# Patient Record
Sex: Male | Born: 1955
Health system: Southern US, Community
[De-identification: ages and names within clinical notes are randomized; demographics above are authoritative.]

## PROBLEM LIST (undated history)

## (undated) DIAGNOSIS — T7840XA Allergy, unspecified, initial encounter: Secondary | ICD-10-CM

## (undated) DIAGNOSIS — K5792 Diverticulitis of intestine, part unspecified, without perforation or abscess without bleeding: Secondary | ICD-10-CM

## (undated) DIAGNOSIS — K219 Gastro-esophageal reflux disease without esophagitis: Secondary | ICD-10-CM

## (undated) DIAGNOSIS — K6289 Other specified diseases of anus and rectum: Secondary | ICD-10-CM

## (undated) DIAGNOSIS — K611 Rectal abscess: Secondary | ICD-10-CM

## (undated) DIAGNOSIS — Z8619 Personal history of other infectious and parasitic diseases: Secondary | ICD-10-CM

## (undated) DIAGNOSIS — M1990 Unspecified osteoarthritis, unspecified site: Secondary | ICD-10-CM

## (undated) DIAGNOSIS — I10 Essential (primary) hypertension: Secondary | ICD-10-CM

## (undated) HISTORY — DX: Gastro-esophageal reflux disease without esophagitis: K21.9

## (undated) HISTORY — PX: HEMORRHOID BANDING: SHX5850

## (undated) HISTORY — DX: Other specified diseases of anus and rectum: K62.89

## (undated) HISTORY — DX: Rectal abscess: K61.1

## (undated) HISTORY — PX: FRACTURE SURGERY: SHX138

## (undated) HISTORY — DX: Allergy, unspecified, initial encounter: T78.40XA

## (undated) HISTORY — DX: Personal history of other infectious and parasitic diseases: Z86.19

## (undated) HISTORY — PX: OTHER SURGICAL HISTORY: SHX169

## (undated) HISTORY — DX: Unspecified osteoarthritis, unspecified site: M19.90

## (undated) NOTE — *Deleted (*Deleted)
Health Maintenance Due  Topic Date Due  . INFLUENZA VACCINE  12/01/2019  We will call you within two weeks about your referral to Gastroenterology. If you do not hear within 3 weeks, give [place] a call.   Depression screen [facility] 2/9 08/15/2019 10/15/2018 04/16/2018  Decreased Interest 0 1 0  Down, Depressed, Hopeless 0 1 0  PHQ - 2 Score 0 2 0  Altered sleeping 0 2 -  Tired, decreased energy 0 2 -  Change in appetite 0 1 -  Feeling bad or failure about yourself  0 0 -  Trouble concentrating 0 1 -  Moving slowly or fidgety/restless 0 0 -  Suicidal thoughts 0 0 -  PHQ-9 Score 0 8 -  Difficult doing work/chores Not difficult at all - -

---

## 1999-10-02 ENCOUNTER — Other Ambulatory Visit: Admission: RE | Admit: 1999-10-02 | Discharge: 1999-10-02 | Payer: Self-pay | Admitting: *Deleted

## 1999-10-22 ENCOUNTER — Ambulatory Visit (HOSPITAL_BASED_OUTPATIENT_CLINIC_OR_DEPARTMENT_OTHER): Admission: RE | Admit: 1999-10-22 | Discharge: 1999-10-22 | Payer: Self-pay | Admitting: *Deleted

## 1999-10-22 ENCOUNTER — Encounter (INDEPENDENT_AMBULATORY_CARE_PROVIDER_SITE_OTHER): Payer: Self-pay | Admitting: *Deleted

## 2000-10-06 ENCOUNTER — Ambulatory Visit (HOSPITAL_COMMUNITY): Admission: RE | Admit: 2000-10-06 | Discharge: 2000-10-06 | Payer: Self-pay | Admitting: Gastroenterology

## 2010-01-08 ENCOUNTER — Encounter: Admission: RE | Admit: 2010-01-08 | Discharge: 2010-01-08 | Payer: Self-pay | Admitting: Orthopedic Surgery

## 2010-01-12 ENCOUNTER — Ambulatory Visit (HOSPITAL_COMMUNITY): Admission: RE | Admit: 2010-01-12 | Discharge: 2010-01-13 | Payer: Self-pay | Admitting: Orthopedic Surgery

## 2010-01-12 HISTORY — PX: SHOULDER SURGERY: SHX246

## 2010-01-14 ENCOUNTER — Encounter: Admission: RE | Admit: 2010-01-14 | Discharge: 2010-01-14 | Payer: Self-pay | Admitting: Orthopedic Surgery

## 2010-02-22 ENCOUNTER — Emergency Department (HOSPITAL_COMMUNITY): Admission: EM | Admit: 2010-02-22 | Discharge: 2010-02-22 | Payer: Self-pay | Admitting: Emergency Medicine

## 2010-02-26 ENCOUNTER — Encounter
Admission: RE | Admit: 2010-02-26 | Discharge: 2010-04-30 | Payer: Self-pay | Source: Home / Self Care | Attending: Orthopedic Surgery | Admitting: Orthopedic Surgery

## 2010-03-08 ENCOUNTER — Ambulatory Visit (HOSPITAL_COMMUNITY): Admission: RE | Admit: 2010-03-08 | Discharge: 2010-03-08 | Payer: Self-pay | Admitting: General Surgery

## 2010-05-03 ENCOUNTER — Encounter
Admission: RE | Admit: 2010-05-03 | Discharge: 2010-06-01 | Payer: Self-pay | Source: Home / Self Care | Attending: Orthopedic Surgery | Admitting: Orthopedic Surgery

## 2010-05-31 ENCOUNTER — Encounter: Admit: 2010-05-31 | Payer: Self-pay | Admitting: Orthopedic Surgery

## 2010-06-07 ENCOUNTER — Encounter: Admit: 2010-06-07 | Payer: Self-pay | Admitting: Orthopedic Surgery

## 2010-06-07 ENCOUNTER — Ambulatory Visit: Payer: Managed Care, Other (non HMO) | Attending: Orthopedic Surgery | Admitting: Physical Therapy

## 2010-06-07 DIAGNOSIS — M25619 Stiffness of unspecified shoulder, not elsewhere classified: Secondary | ICD-10-CM | POA: Insufficient documentation

## 2010-06-07 DIAGNOSIS — M25519 Pain in unspecified shoulder: Secondary | ICD-10-CM | POA: Insufficient documentation

## 2010-06-07 DIAGNOSIS — IMO0001 Reserved for inherently not codable concepts without codable children: Secondary | ICD-10-CM | POA: Insufficient documentation

## 2010-06-14 ENCOUNTER — Ambulatory Visit: Payer: Managed Care, Other (non HMO) | Admitting: Physical Therapy

## 2010-06-21 ENCOUNTER — Ambulatory Visit: Payer: Managed Care, Other (non HMO) | Admitting: Physical Therapy

## 2010-06-28 ENCOUNTER — Ambulatory Visit: Payer: Managed Care, Other (non HMO) | Admitting: Physical Therapy

## 2010-07-14 LAB — CBC
HCT: 46.1 % (ref 39.0–52.0)
Hemoglobin: 15.6 g/dL (ref 13.0–17.0)
MCH: 31.8 pg (ref 26.0–34.0)
MCHC: 33.8 g/dL (ref 30.0–36.0)
MCV: 94.1 fL (ref 78.0–100.0)
Platelets: 190 10*3/uL (ref 150–400)
RBC: 4.9 MIL/uL (ref 4.22–5.81)
RDW: 13.4 % (ref 11.5–15.5)
WBC: 8.5 10*3/uL (ref 4.0–10.5)

## 2010-07-14 LAB — DIFFERENTIAL
Basophils Absolute: 0 10*3/uL (ref 0.0–0.1)
Basophils Relative: 0 % (ref 0–1)
Eosinophils Absolute: 0 10*3/uL (ref 0.0–0.7)
Eosinophils Relative: 0 % (ref 0–5)
Lymphocytes Relative: 14 % (ref 12–46)
Lymphs Abs: 1.2 10*3/uL (ref 0.7–4.0)
Monocytes Absolute: 0.6 10*3/uL (ref 0.1–1.0)
Monocytes Relative: 7 % (ref 3–12)
Neutro Abs: 6.8 10*3/uL (ref 1.7–7.7)
Neutrophils Relative %: 79 % — ABNORMAL HIGH (ref 43–77)

## 2010-07-14 LAB — LIPASE, BLOOD: Lipase: 33 U/L (ref 11–59)

## 2010-07-14 LAB — COMPREHENSIVE METABOLIC PANEL
AST: 67 U/L — ABNORMAL HIGH (ref 0–37)
Albumin: 4.3 g/dL (ref 3.5–5.2)
BUN: 8 mg/dL (ref 6–23)
Calcium: 9.3 mg/dL (ref 8.4–10.5)
Creatinine, Ser: 0.99 mg/dL (ref 0.4–1.5)
GFR calc Af Amer: >60 mL/min (ref >60)
Total Bilirubin: 0.5 mg/dL (ref 0.3–1.2)
Total Protein: 7.5 g/dL (ref 6.0–8.3)

## 2010-07-15 LAB — COMPREHENSIVE METABOLIC PANEL
ALT: 65 U/L — ABNORMAL HIGH (ref 0–53)
AST: 56 U/L — ABNORMAL HIGH (ref 0–37)
BUN: 14 mg/dL (ref 6–23)
CO2: 29 mEq/L (ref 19–32)
Calcium: 9.5 mg/dL (ref 8.4–10.5)
Chloride: 103 mEq/L (ref 96–112)
Creatinine, Ser: 0.92 mg/dL (ref 0.4–1.5)
GFR calc Af Amer: >60 mL/min (ref >60)
GFR calc non Af Amer: >60 mL/min (ref >60)
Glucose, Bld: 122 mg/dL — ABNORMAL HIGH (ref 70–99)
Potassium: 4.4 mEq/L (ref 3.5–5.1)
Total Bilirubin: 1 mg/dL (ref 0.3–1.2)
Total Protein: 7 g/dL (ref 6.0–8.3)

## 2010-07-15 LAB — APTT: aPTT: 33 seconds (ref 24–37)

## 2010-07-15 LAB — URINALYSIS, ROUTINE W REFLEX MICROSCOPIC
Bilirubin Urine: NEGATIVE
Glucose, UA: NEGATIVE mg/dL
Hgb urine dipstick: NEGATIVE
Ketones, ur: NEGATIVE mg/dL
Nitrite: NEGATIVE
Protein, ur: NEGATIVE mg/dL
Specific Gravity, Urine: 1.025 (ref 1.005–1.030)

## 2010-07-15 LAB — PROTIME-INR: Prothrombin Time: 13.2 seconds (ref 11.6–15.2)

## 2010-07-15 LAB — CBC
HCT: 42.1 % (ref 39.0–52.0)
Hemoglobin: 14.4 g/dL (ref 13.0–17.0)
MCH: 32.8 pg (ref 26.0–34.0)
MCV: 95.9 fL (ref 78.0–100.0)
RBC: 4.39 MIL/uL (ref 4.22–5.81)
RDW: 13 % (ref 11.5–15.5)
WBC: 7.3 10*3/uL (ref 4.0–10.5)

## 2010-07-15 LAB — SURGICAL PCR SCREEN
MRSA, PCR: NEGATIVE
Staphylococcus aureus: POSITIVE — AB

## 2010-09-17 NOTE — Op Note (Signed)
[place]  Patient:    Marc Ponce, Marc Ponce                       MRN: [id number] Proc. Date: 10/22/99 Adm. Date:  [id number] Disc. Date: [id number] Attending:  Kathleen Lime                           Operative Report  PREOPERATIVE DIAGNOSIS: 1. Right parotid mass. 2. Deviated nasal septum. 3. Compensatory hyperplasia of inferior turbinates.  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  OPERATION PERFORMED: 1. Excision of right parotid mass without VII nerve dissection. 2. Nasal septoplasty. 3. Submucous resection of right inferior nasal turbinate.  SURGEON:  Windell Moment, M.D.  ANESTHESIA:  General orotracheal.  DESCRIPTION OF PROCEDURE:  With the patient under general orotracheal anesthesia, the left face and neck was prepped and draped in sterile fashion with full visualization of the left side of the face.  A modified Blair incision was marked, tattooed, and incision made through skin, subcutaneous tissues, fatty layer of tissue down to the capsule of the parotid gland.  The flap was elevated anteriorly and the parotid fascia.  The patient had a 1 to 1.5 cm firm freely mobile persistent nodular mass presenting overlying the angle of the mandible region or just anterior and on elevating the flap we encountered mass at the anterior aspect of the parotid gland.  Careful visual inspection revealed that it appeared to be lying on the lateral surface of the anterior aspect of the parotid gland capsule.  Therefore careful slow dissection with monitoring for facial movement of the mass surrounded by fatty tissue was slowly done, able to completely excise the nodular mass off of the anterolateral parotid gland substance with the nodular mass actually leaving an indentation in the gland at this point.  Frozen section of the mass which was sectioned in the operating room appeared to be a little definitive nodular mass  either fatty or lymph node appeared grossly.  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  With hemostasis complete with use of the Shaw scalpel throughout the procedure after elevating the flap and light touch cautery, the flap was sewn back with two layer closure using interrupted 4-0 chromic catgut sutures subcutaneously and the skin approximated with a running 4-0 Surgilon suture.  The patient was then reprepped and draped for nasal surgery.  Nasal block was applied with olive tipped cotton probe soaked with 4% Xylocaine with ephedrine solution applied to the sphenopalatine and anterior ethmoid nerve areas. Cotton pledgets soaked in a similar solution were inserted along the middle and inferior turbinates though unable to insert on the left side due to the marked septal deflection along the maxillary crest into the lateral nasal wall.  With the nose vasoconstricted and additionally infiltrated with 1% Xylocaine with 1:100,000 epinephrine in the columella and septum and the nose to inspection revealed immediately revealed anterior maxillary crest, inferior quadrilateral cartilage spur, sharp jutting into the lateral nasal wall at the piriform aperture.  There was compensatory hyperplasia of the right inferior turbinate, marked sharp angulated irregular deflection to the left visible on the right as well.  A caudal incision was made inside the left nasal vestibule along the free margin of the caudal septal cartilage.  Mucosal flaps were elevated off the cartilage and bony septum bilaterally encountering the very sharp maxillary crest and vomerine as  well as inferior quadrilateral cartilage deflection to the left.  The cartilaginous maxillary crest and vomerine deformities were all removed.  Once this was cleared away it was evident that the perpendicular ethmoid plate likewise was deflected towards the left quite significantly and the quadrilateral cartilage was separated  posterior superiorly and the deflected ethmoid plate removed as well.  This allowed the septum to lie pretty much in the midline, there was still some convexity of the anterior septal strut which needed to be maintained in position for adequate support.  The posterior inferior quadrilateral cartilage completely separated from the nasal spine area and reattached with a figure-of-eight 4-0 chromic catgut suture at time of closure.  Stab incision was made over the anterior aspect of the right inferior turbinate.  The septal mucosa was elevated off the turbinate bone, the lower third to 50% of the turbinate bone and attached free margin and meatal mucosa excised taking down the posterior extension completely.  Hemostasis obtained with suction cautery along the bony and mucosal margins.  The remaining inferior meatal bone was outfractured.  The caudal incision was then closed having made a pocket to receive the caudal septal cartilage in the columella suturing through the mucosal flap and cartilage and back to the columellar skin to stabilize the septal cartilage in its repositioned normal columellar location.  The nose was then packed with Vaseline gauze impregnated with Bacitracin bilaterally.  The patients oral cavity was inspected.  There was no bleeding in the nasopharynx.  This was suctioned completely clear.  Estimated blood loss for the total procedure was 50 cc or less.  The patient tolerated the procedure well and was taken to the recovery room in stable general condition. DD:  10/22/99 TD:  10/25/99 Job: [id number]

## 2011-04-16 ENCOUNTER — Inpatient Hospital Stay (HOSPITAL_COMMUNITY)
Admission: EM | Admit: 2011-04-16 | Discharge: 2011-04-20 | DRG: 392 | Disposition: A | Discharge status: Home / Self Care | Payer: Managed Care, Other (non HMO) | Source: Ambulatory Visit | Attending: Family Medicine | Admitting: Family Medicine

## 2011-04-16 ENCOUNTER — Encounter (HOSPITAL_COMMUNITY): Payer: Self-pay | Admitting: *Deleted

## 2011-04-16 ENCOUNTER — Emergency Department (HOSPITAL_COMMUNITY): Payer: Managed Care, Other (non HMO)

## 2011-04-16 ENCOUNTER — Emergency Department (HOSPITAL_BASED_OUTPATIENT_CLINIC_OR_DEPARTMENT_OTHER)
Admission: EM | Admit: 2011-04-16 | Discharge: 2011-04-16 | Disposition: A | Discharge status: Home / Self Care | Payer: Managed Care, Other (non HMO) | Source: Home / Self Care | Attending: Emergency Medicine | Admitting: Emergency Medicine

## 2011-04-16 ENCOUNTER — Emergency Department (INDEPENDENT_AMBULATORY_CARE_PROVIDER_SITE_OTHER): Payer: Managed Care, Other (non HMO)

## 2011-04-16 ENCOUNTER — Encounter: Payer: Self-pay | Admitting: *Deleted

## 2011-04-16 DIAGNOSIS — F1010 Alcohol abuse, uncomplicated: Secondary | ICD-10-CM

## 2011-04-16 DIAGNOSIS — K5732 Diverticulitis of large intestine without perforation or abscess without bleeding: Principal | ICD-10-CM | POA: Diagnosis present

## 2011-04-16 DIAGNOSIS — K7689 Other specified diseases of liver: Secondary | ICD-10-CM

## 2011-04-16 DIAGNOSIS — R0989 Other specified symptoms and signs involving the circulatory and respiratory systems: Secondary | ICD-10-CM | POA: Diagnosis present

## 2011-04-16 DIAGNOSIS — I10 Essential (primary) hypertension: Secondary | ICD-10-CM | POA: Insufficient documentation

## 2011-04-16 DIAGNOSIS — K5792 Diverticulitis of intestine, part unspecified, without perforation or abscess without bleeding: Secondary | ICD-10-CM

## 2011-04-16 DIAGNOSIS — R109 Unspecified abdominal pain: Secondary | ICD-10-CM

## 2011-04-16 DIAGNOSIS — R0609 Other forms of dyspnea: Secondary | ICD-10-CM | POA: Diagnosis present

## 2011-04-16 DIAGNOSIS — R1032 Left lower quadrant pain: Secondary | ICD-10-CM | POA: Insufficient documentation

## 2011-04-16 DIAGNOSIS — K5900 Constipation, unspecified: Secondary | ICD-10-CM | POA: Diagnosis present

## 2011-04-16 HISTORY — DX: Diverticulitis of intestine, part unspecified, without perforation or abscess without bleeding: K57.92

## 2011-04-16 HISTORY — DX: Essential (primary) hypertension: I10

## 2011-04-16 LAB — DIFFERENTIAL
Basophils Absolute: 0 K/uL (ref 0.0–0.1)
Basophils Relative: 0 % (ref 0–1)
Basophils Relative: 0 % (ref 0–1)
Eosinophils Absolute: 0.1 K/uL (ref 0.0–0.7)
Eosinophils Relative: 1 % (ref 0–5)
Lymphocytes Relative: 16 % (ref 12–46)
Lymphocytes Relative: 5 % — ABNORMAL LOW (ref 12–46)
Lymphs Abs: 2.2 K/uL (ref 0.7–4.0)
Monocytes Absolute: 1.8 10*3/uL — ABNORMAL HIGH (ref 0.1–1.0)
Monocytes Absolute: 1 10*3/uL (ref 0.1–1.0)
Monocytes Relative: 14 % — ABNORMAL HIGH (ref 3–12)
Monocytes Relative: 12 % (ref 3–12)
Neutro Abs: 9.4 10*3/uL — ABNORMAL HIGH (ref 1.7–7.7)
Neutro Abs: 7.2 10*3/uL (ref 1.7–7.7)
Neutrophils Relative %: 70 % (ref 43–77)

## 2011-04-16 LAB — COMPREHENSIVE METABOLIC PANEL WITH GFR
GFR calc non Af Amer: 83 mL/min — ABNORMAL LOW (ref >90)
Sodium: 137 meq/L (ref 135–145)

## 2011-04-16 LAB — URINALYSIS, ROUTINE W REFLEX MICROSCOPIC
Bilirubin Urine: NEGATIVE
Glucose, UA: NEGATIVE mg/dL
Glucose, UA: NEGATIVE mg/dL
Hgb urine dipstick: NEGATIVE
Ketones, ur: NEGATIVE mg/dL
Ketones, ur: 15 mg/dL — AB
Leukocytes, UA: NEGATIVE
Nitrite: NEGATIVE
Nitrite: NEGATIVE
Protein, ur: NEGATIVE mg/dL
Protein, ur: NEGATIVE mg/dL
Specific Gravity, Urine: 1.008 (ref 1.005–1.030)
Specific Gravity, Urine: 1.035 — ABNORMAL HIGH (ref 1.005–1.030)
Urobilinogen, UA: 1 mg/dL (ref 0.0–1.0)
Urobilinogen, UA: 4 mg/dL — ABNORMAL HIGH (ref 0.0–1.0)
pH: 6 (ref 5.0–8.0)
pH: 5.5 (ref 5.0–8.0)

## 2011-04-16 LAB — URINE MICROSCOPIC-ADD ON

## 2011-04-16 LAB — COMPREHENSIVE METABOLIC PANEL
ALT: 57 U/L — ABNORMAL HIGH (ref 0–53)
AST: 53 U/L — ABNORMAL HIGH (ref 0–37)
Albumin: 3.8 g/dL (ref 3.5–5.2)
Alkaline Phosphatase: 84 U/L (ref 39–117)
BUN: 12 mg/dL (ref 6–23)
BUN: 16 mg/dL (ref 6–23)
CO2: 23 mEq/L (ref 19–32)
CO2: 23 mEq/L (ref 19–32)
Calcium: 9.3 mg/dL (ref 8.4–10.5)
Chloride: 103 mEq/L (ref 96–112)
Chloride: 101 mEq/L (ref 96–112)
Creatinine, Ser: 1 mg/dL (ref 0.50–1.35)
Creatinine, Ser: 1.22 mg/dL (ref 0.50–1.35)
GFR calc Af Amer: >90 mL/min (ref >90)
GFR calc Af Amer: 76 mL/min — ABNORMAL LOW (ref >90)
GFR calc non Af Amer: 66 mL/min — ABNORMAL LOW (ref >90)
Glucose, Bld: 115 mg/dL — ABNORMAL HIGH (ref 70–99)
Potassium: 4.1 mEq/L (ref 3.5–5.1)
Total Bilirubin: 1.3 mg/dL — ABNORMAL HIGH (ref 0.3–1.2)
Total Bilirubin: 1.8 mg/dL — ABNORMAL HIGH (ref 0.3–1.2)
Total Protein: 7.6 g/dL (ref 6.0–8.3)

## 2011-04-16 LAB — CBC
HCT: 44.4 % (ref 39.0–52.0)
HCT: 43.8 % (ref 39.0–52.0)
Hemoglobin: 15.1 g/dL (ref 13.0–17.0)
Hemoglobin: 15.2 g/dL (ref 13.0–17.0)
MCH: 32.5 pg (ref 26.0–34.0)
MCHC: 34 g/dL (ref 30.0–36.0)
MCHC: 34.7 g/dL (ref 30.0–36.0)
MCV: 95.7 fL (ref 78.0–100.0)
Platelets: 163 10*3/uL (ref 150–400)
RBC: 4.64 MIL/uL (ref 4.22–5.81)
RDW: 13.2 % (ref 11.5–15.5)
WBC: 13.5 10*3/uL — ABNORMAL HIGH (ref 4.0–10.5)

## 2011-04-16 MED ORDER — MORPHINE SULFATE 4 MG/ML IJ SOLN
4.0000 mg | Freq: Once | INTRAMUSCULAR | Status: AC
  Administered 2011-04-16: 4 mg via INTRAVENOUS
  Filled 2011-04-16: qty 1

## 2011-04-16 MED ORDER — IOHEXOL 300 MG/ML  SOLN
100.0000 mL | Freq: Once | INTRAMUSCULAR | Status: AC | PRN
  Administered 2011-04-16: 100 mL via INTRAVENOUS

## 2011-04-16 MED ORDER — HYDROCODONE-ACETAMINOPHEN 5-325 MG PO TABS: ORAL_TABLET | ORAL | Status: DC

## 2011-04-16 MED ORDER — CIPROFLOXACIN IN D5W 400 MG/200ML IV SOLN
400.0000 mg | Freq: Once | INTRAVENOUS | Status: AC
  Administered 2011-04-16: 400 mg via INTRAVENOUS
  Filled 2011-04-16: qty 200

## 2011-04-16 MED ORDER — HYDROMORPHONE HCL PF 1 MG/ML IJ SOLN
1.0000 mg | Freq: Once | INTRAMUSCULAR | Status: AC
  Administered 2011-04-16: 1 mg via INTRAVENOUS
  Filled 2011-04-16: qty 1

## 2011-04-16 MED ORDER — ONDANSETRON HCL 4 MG/2ML IJ SOLN
4.0000 mg | Freq: Once | INTRAMUSCULAR | Status: AC
  Administered 2011-04-16: 4 mg via INTRAVENOUS
  Filled 2011-04-16: qty 2

## 2011-04-16 MED ORDER — METRONIDAZOLE 500 MG PO TABS: 500.0000 mg | ORAL_TABLET | Freq: Three times a day (TID) | ORAL | Status: DC

## 2011-04-16 MED ORDER — SODIUM CHLORIDE 0.9 % IV SOLN
Freq: Once | INTRAVENOUS | Status: AC
  Administered 2011-04-16: 04:00:00 via INTRAVENOUS

## 2011-04-16 MED ORDER — FENTANYL CITRATE 0.05 MG/ML IJ SOLN
INTRAMUSCULAR | Status: AC
  Administered 2011-04-16: 100 ug via INTRAVENOUS
  Filled 2011-04-16: qty 2

## 2011-04-16 MED ORDER — METRONIDAZOLE IN NACL 5-0.79 MG/ML-% IV SOLN
500.0000 mg | Freq: Once | INTRAVENOUS | Status: AC
  Administered 2011-04-16: 500 mg via INTRAVENOUS
  Filled 2011-04-16: qty 100

## 2011-04-16 MED ORDER — SODIUM CHLORIDE 0.9 % IV SOLN
INTRAVENOUS | Status: DC
  Administered 2011-04-16: 20:00:00 via INTRAVENOUS

## 2011-04-16 MED ORDER — FENTANYL CITRATE 0.05 MG/ML IJ SOLN
100.0000 ug | Freq: Once | INTRAMUSCULAR | Status: AC
  Administered 2011-04-16: 100 ug via INTRAVENOUS

## 2011-04-16 MED ORDER — SODIUM CHLORIDE 0.9 % IV BOLUS (SEPSIS)
500.0000 mL | Freq: Once | INTRAVENOUS | Status: AC
  Administered 2011-04-16: 500 mL via INTRAVENOUS

## 2011-04-16 MED ORDER — FENTANYL CITRATE 0.05 MG/ML IJ SOLN
100.0000 ug | Freq: Once | INTRAMUSCULAR | Status: AC
  Administered 2011-04-16: 100 ug via INTRAVENOUS
  Filled 2011-04-16: qty 2

## 2011-04-16 MED ORDER — CIPROFLOXACIN HCL 500 MG PO TABS: 500.0000 mg | ORAL_TABLET | Freq: Two times a day (BID) | ORAL | Status: DC

## 2011-04-16 MED ORDER — IOHEXOL 300 MG/ML  SOLN
20.0000 mL | INTRAMUSCULAR | Status: AC
  Administered 2011-04-16: 20 mL via ORAL

## 2011-04-16 NOTE — ED Notes (Signed)
Pt has urinal at bedside but is unable to go right now.

## 2011-04-16 NOTE — ED Notes (Signed)
UA requested.  Pt unable to void at this time

## 2011-04-16 NOTE — ED Notes (Signed)
Pt presnets to ED today with continued abd pain from Tues.  Pt states hx of diverticulitis with same sx.

## 2011-04-16 NOTE — ED Notes (Signed)
Paged [place] to [phone]

## 2011-04-16 NOTE — ED Notes (Signed)
Patient's GI doctor is Amedeo Plenty

## 2011-04-16 NOTE — ED Notes (Signed)
Patient with abdominal pain.  Patient was seen at [facility] and was diagnosed with diverticulitis.  Patient was told to follow up if pain continues.  Patient's feel bloated and having diarrhea

## 2011-04-16 NOTE — ED Provider Notes (Addendum)
History     CSN: [id number] Arrival date & time: 04/16/2011  5:01 PM   First MD Initiated Contact with Patient 04/16/11 1736      Chief Complaint  Patient presents with  . Abdominal Pain    (Consider location/radiation/quality/duration/timing/severity/associated sxs/prior treatment) HPI.Marland Kitchen left lower cautery abdominal pain for 4 days. Seen at [facility] last night and discharged home with diagnosis of diverticulitis. He is feeling worse today.  Pain has persisted and he is running a low-grade fever. Poor appetite.  Pain is sharp and constant. No radiation.  He received initial dose of IV antibiotics in ED last night. Nothing makes it better or worse  Past Medical History  Diagnosis Date  . Hypertension   . Diverticulitis     History reviewed. No pertinent past surgical history.  History reviewed. No pertinent family history.  History  Substance Use Topics  . Smoking status: Not on file  . Smokeless tobacco: Not on file  . Alcohol Use: No      Review of Systems  All other systems reviewed and are negative.    Allergies  Review of patient's allergies indicates no known allergies.  Home Medications   Current Outpatient Rx  Name Route Sig Dispense Refill  . CIPROFLOXACIN HCL 500 MG PO TABS Oral Take 500 mg by mouth 2 (two) times daily.      Marland Kitchen HYDROCODONE-ACETAMINOPHEN 5-325 MG PO TABS Oral Take 1-2 tablets by mouth every 6 (six) hours as needed. 1-2 tablets po q 6 hours prn pain     . LANSOPRAZOLE 30 MG PO CPDR Oral Take 30 mg by mouth daily.      Marland Kitchen METRONIDAZOLE 500 MG PO TABS Oral Take 500 mg by mouth 3 (three) times daily.        BP 134/78  Pulse 101  Temp(Src) 99.6 F (37.6 C) (Oral)  Resp 37  SpO2 95%  Physical Exam  Nursing note and vitals reviewed. Constitutional: He is oriented to person, place, and time. He appears well-developed and well-nourished.  HENT:  Head: Normocephalic and atraumatic.  Eyes: Conjunctivae and EOM are normal.  Pupils are equal, round, and reactive to light.  Neck: Normal range of motion. Neck supple.  Cardiovascular: Normal rate and regular rhythm.   Pulmonary/Chest: Effort normal and breath sounds normal.  Abdominal: Soft. Bowel sounds are normal.       Tender left lower quadrant   Musculoskeletal: Normal range of motion.  Neurological: He is alert and oriented to person, place, and time.  Skin: Skin is warm and dry.  Psychiatric: He has a normal mood and affect.    ED Course  Procedures (including critical care time)   Labs Reviewed  CBC  DIFFERENTIAL  COMPREHENSIVE METABOLIC PANEL  URINALYSIS, ROUTINE W REFLEX MICROSCOPIC   Ct Abdomen Pelvis W Contrast  04/16/2011  *RADIOLOGY REPORT*  Clinical Data: Abdominal pain  CT ABDOMEN AND PELVIS WITH CONTRAST  Technique:  Multidetector CT imaging of the abdomen and pelvis was performed following the standard protocol during bolus administration of intravenous contrast.  Contrast: 179m OMNIPAQUE IOHEXOL 300 MG/ML IV SOLN  Comparison: None.  Findings: Clear lung bases.  Normal heart size.  Fatty liver.  Cholelithiasis.  Hypodensity within the left hepatic lobe is too small further characterize.  No biliary ductal dilatation.  Unremarkable spleen, pancreas, adrenal glands.  Symmetric renal enhancement.  No hydronephrosis or hydroureter.  No bowel obstruction.  The sigmoid colon diverticulitis.  No loculated fluid collection or free intraperitoneal  air.  No lymphadenopathy.  There is scattered atherosclerotic calcification of the aorta and its branches. No aneurysmal dilatation.  Thin-walled bladder.  Multilevel degenerative changes of the imaged spine. No acute or aggressive appearing osseous lesion.  IMPRESSION: Sigmoid colon diverticulitis.  No loculated fluid collection to suggest abscess.  No free intraperitoneal air.  Hepatic steatosis.  Original Report Authenticated By: Suanne Marker, M.D.     No diagnosis found.    MDM  Patient has  diverticulitis from CT scan last night. Feeling worse today. Will hydrate, treat pain, repeat antibiotics, admit        Nat Christen, MD 04/16/11 1940  Nat Christen, MD 04/16/11 2256

## 2011-04-16 NOTE — Discharge Instructions (Signed)
 Diverticulitis A diverticulum is a small pouch or sac on the colon. Diverticulosis is the presence of these diverticula on the colon. Diverticulitis is the irritation (inflammation) or infection of diverticula. CAUSES  The colon and its diverticula contain bacteria. If food particles block the tiny opening to a diverticulum, the bacteria inside can grow and cause an increase in pressure. This leads to infection and inflammation and is called diverticulitis. SYMPTOMS   Abdominal pain and tenderness. Usually, the pain is located on the left side of your abdomen. However, it could be located elsewhere.   Fever.   Bloating.   Feeling sick to your stomach (nausea).   Throwing up (vomiting).   Abnormal stools.  DIAGNOSIS  Your caregiver will take a history and perform a physical exam. Since many things can cause abdominal pain, other tests may be necessary. Tests may include:  Blood tests.   Urine tests.   X-ray of the abdomen.   CT scan of the abdomen.  Sometimes, surgery is needed to determine if diverticulitis or other conditions are causing your symptoms. TREATMENT  Most of the time, you can be treated without surgery. Treatment includes:  Resting the bowels by only having liquids for a few days. As you improve, you will need to eat a low-fiber diet.   Intravenous (IV) fluids if you are losing body fluids (dehydrated).   Antibiotic medicines that treat infections may be given.   Pain and nausea medicine, if needed.   Surgery if the inflamed diverticulum has burst.  HOME CARE INSTRUCTIONS   Try a clear liquid diet (broth, tea, or water for as long as directed by your caregiver). You may then gradually begin a low-fiber diet as tolerated. A low-fiber diet is a diet with less than 10 grams of fiber. Choose the foods below to reduce fiber in the diet:   White breads, cereals, rice, and pasta.   Cooked fruits and vegetables or soft fresh fruits and vegetables without the skin.     Ground or well-cooked tender beef, ham, veal, lamb, pork, or poultry.   Eggs and seafood.   After your diverticulitis symptoms have improved, your caregiver may put you on a high-fiber diet. A high-fiber diet includes 14 grams of fiber for every 1000 calories consumed. For a standard 2000 calorie diet, you would need 28 grams of fiber. Follow these diet guidelines to help you increase the fiber in your diet. It is important to slowly increase the amount fiber in your diet to avoid gas, constipation, and bloating.   Choose whole-grain breads, cereals, pasta, and brown rice.   Choose fresh fruits and vegetables with the skin on. Do not overcook vegetables because the more vegetables are cooked, the more fiber is lost.   Choose more nuts, seeds, legumes, dried peas, beans, and lentils.   Look for food products that have greater than 3 grams of fiber per serving on the Nutrition Facts label.   Take all medicine as directed by your caregiver.   If your caregiver has given you a follow-up appointment, it is very important that you go. Not going could result in lasting (chronic) or permanent injury, pain, and disability. If there is any problem keeping the appointment, call to reschedule.  SEEK MEDICAL CARE IF:   Your pain does not improve.   You have a hard time advancing your diet beyond clear liquids.   Your bowel movements do not return to normal.  SEEK IMMEDIATE MEDICAL CARE IF:   Your pain becomes  worse.   You have an oral temperature above 102 F (38.9 C), not controlled by medicine.   You have repeated vomiting.   You have bloody or black, tarry stools.   Symptoms that brought you to your caregiver become worse or are not getting better.  MAKE SURE YOU:   Understand these instructions.   Will watch your condition.   Will get help right away if you are not doing well or get worse.  Document Released: 01/26/2005 Document Revised: 12/29/2010 Document Reviewed:  05/24/2010 [facility] Patient Information 2012 [place], [place].    Narcotic and benzodiazepine use may cause drowsiness, slowed breathing or dependence.  Please use with caution and do not drive, operate machinery or watch young children alone while taking them.  Taking combinations of these medications or drinking alcohol will potentiate these effects.

## 2011-04-16 NOTE — ED Provider Notes (Addendum)
History     CSN: [id number] Arrival date & time: 04/16/2011 12:36 AM   First MD Initiated Contact with Patient 04/16/11 [phone]      Chief Complaint  Patient presents with  . Abdominal Pain    (Consider location/radiation/quality/duration/timing/severity/associated sxs/prior treatment) HPI Comments: Patient with prior history of diverticulitis. 4 days ago while he was out of town for work he developed constipation associated with lower abdominal pain in the suprapubic and left lower quadrant region. This reminded him of his prior diverticulitis discomfort. He began taking Dulcolax as well as fiber products and lots of fluids without improvement of his constipation. He has continued to develop slightly worsening lower abdominal pain. He was seen at the local urgent care who did not give him any specific medications, urged him to go see a local emergency department which the patient did not feel was needed. Instead he chose to fly back home which he did yesterday. At this point he has not had any nausea or vomiting but has had decreasing appetite associated with significant abdominal bloating. He did have a temperature at the urgent care of 100.6 in here after arrival in [place] at the airport, he reports his temperature was 100. He has not seen black or bloody stools. He denies back pain or dysuria. No penile or testicular discomfort.  The history is provided by the patient.    Past Medical History  Diagnosis Date  . Hypertension   . Diverticulitis     History reviewed. No pertinent past surgical history.  History reviewed. No pertinent family history.  History  Substance Use Topics  . Smoking status: Never Smoker   . Smokeless tobacco: Not on file  . Alcohol Use:       Review of Systems  Constitutional: Positive for appetite change.  Gastrointestinal: Positive for abdominal pain, constipation and abdominal distention. Negative for nausea, vomiting and blood in stool.    Genitourinary: Positive for dysuria. Negative for flank pain, penile pain and testicular pain.  Musculoskeletal: Negative for back pain.  All other systems reviewed and are negative.    Allergies  Review of patient's allergies indicates no known allergies.  Home Medications   Current Outpatient Rx  Name Route Sig Dispense Refill  . LANSOPRAZOLE 30 MG PO CPDR Oral Take 30 mg by mouth daily.      Marland Kitchen CIPROFLOXACIN HCL 500 MG PO TABS Oral Take 1 tablet (500 mg total) by mouth 2 (two) times daily. 20 tablet 0  . HYDROCODONE-ACETAMINOPHEN 5-325 MG PO TABS  1-2 tablets po q 6 hours prn pain 20 tablet 0  . METRONIDAZOLE 500 MG PO TABS Oral Take 1 tablet (500 mg total) by mouth 3 (three) times daily. 30 tablet 0    BP 176/112  Pulse 96  Temp(Src) 98.7 F (37.1 C) (Oral)  Resp 20  SpO2 94%  Physical Exam  Nursing note and vitals reviewed. Constitutional: He is oriented to person, place, and time. He appears well-developed and well-nourished.  HENT:  Head: Normocephalic and atraumatic.  Eyes: Pupils are equal, round, and reactive to light.  Cardiovascular: Normal rate.   Pulmonary/Chest: Effort normal.  Abdominal: Soft. There is tenderness. There is guarding. There is no CVA tenderness. No hernia.    Musculoskeletal: Normal range of motion.  Neurological: He is alert and oriented to person, place, and time.    ED Course  Procedures (including critical care time)  Labs Reviewed  URINALYSIS, ROUTINE W REFLEX MICROSCOPIC - Abnormal; Notable for the following:  Hgb urine dipstick TRACE (*)    All other components within normal limits  CBC - Abnormal; Notable for the following:    WBC 13.5 (*)    All other components within normal limits  COMPREHENSIVE METABOLIC PANEL - Abnormal; Notable for the following:    Glucose, Bld 115 (*)    AST 53 (*) HEMOLYSIS AT THIS LEVEL MAY AFFECT RESULT   ALT 57 (*)    Total Bilirubin 1.3 (*)    GFR calc non Af Amer 83 (*)    All other  components within normal limits  DIFFERENTIAL - Abnormal; Notable for the following:    Neutro Abs 9.4 (*)    Monocytes Relative 14 (*)    Monocytes Absolute 1.8 (*)    All other components within normal limits  URINE MICROSCOPIC-ADD ON   Ct Abdomen Pelvis W Contrast  04/16/2011  *RADIOLOGY REPORT*  Clinical Data: Abdominal pain  CT ABDOMEN AND PELVIS WITH CONTRAST  Technique:  Multidetector CT imaging of the abdomen and pelvis was performed following the standard protocol during bolus administration of intravenous contrast.  Contrast: 145m OMNIPAQUE IOHEXOL 300 MG/ML IV SOLN  Comparison: None.  Findings: Clear lung bases.  Normal heart size.  Fatty liver.  Cholelithiasis.  Hypodensity within the left hepatic lobe is too small further characterize.  No biliary ductal dilatation.  Unremarkable spleen, pancreas, adrenal glands.  Symmetric renal enhancement.  No hydronephrosis or hydroureter.  No bowel obstruction.  The sigmoid colon diverticulitis.  No loculated fluid collection or free intraperitoneal air.  No lymphadenopathy.  There is scattered atherosclerotic calcification of the aorta and its branches. No aneurysmal dilatation.  Thin-walled bladder.  Multilevel degenerative changes of the imaged spine. No acute or aggressive appearing osseous lesion.  IMPRESSION: Sigmoid colon diverticulitis.  No loculated fluid collection to suggest abscess.  No free intraperitoneal air.  Hepatic steatosis.  Original Report Authenticated By: ASuanne Marker M.D.     1. Diverticulitis     RA sat 94% which is WNL  MDM  Labs, IVF's, IV analgesics and CT scan pending.  I suspect pt has diverticulitis, concern for microperf given the degree of discomfort and fevers.  WBC is elevated here.        5:14 AM Pt improved with some pain meds.  CT per radiologist which I reviewed shows diverticulitis without perforation.  Pt is not vomiting, safe for d/c home and can follow up with PCP or his  gastroenterologist next week for follow up.  Knows to return for sudden severe pain, fevers, vomiting, or other concerns.    MSaddie Benders GDorna Mai MD 04/16/11 0[place]letonGDorna Mai MD 04/16/11 [id number]

## 2011-04-17 ENCOUNTER — Encounter (HOSPITAL_COMMUNITY): Payer: Self-pay | Admitting: *Deleted

## 2011-04-17 LAB — CBC
MCH: 33 pg (ref 26.0–34.0)
MCV: 97.2 fL (ref 78.0–100.0)
Platelets: 181 10*3/uL (ref 150–400)
RDW: 13.4 % (ref 11.5–15.5)
WBC: 11.3 10*3/uL — ABNORMAL HIGH (ref 4.0–10.5)

## 2011-04-17 LAB — BASIC METABOLIC PANEL
CO2: 22 mEq/L (ref 19–32)
Calcium: 8.8 mg/dL (ref 8.4–10.5)
Creatinine, Ser: 1.14 mg/dL (ref 0.50–1.35)

## 2011-04-17 MED ORDER — BIOTENE DRY MOUTH MT LIQD: 15.0000 mL | Freq: Two times a day (BID) | OROMUCOSAL | Status: DC

## 2011-04-17 MED ORDER — PIPERACILLIN-TAZOBACTAM 3.375 G IVPB
3.3750 g | Freq: Three times a day (TID) | INTRAVENOUS | Status: DC
  Administered 2011-04-17 – 2011-04-18 (×4): 3.375 g via INTRAVENOUS
  Filled 2011-04-17 (×6): qty 50

## 2011-04-17 MED ORDER — POLYETHYLENE GLYCOL 3350 17 G PO PACK
17.0000 g | PACK | Freq: Every day | ORAL | Status: DC
  Administered 2011-04-17 – 2011-04-18 (×2): 17 g via ORAL
  Filled 2011-04-17 (×2): qty 1

## 2011-04-17 MED ORDER — HEPARIN SODIUM (PORCINE) 5000 UNIT/ML IJ SOLN
5000.0000 [IU] | Freq: Three times a day (TID) | INTRAMUSCULAR | Status: DC
  Administered 2011-04-17 – 2011-04-20 (×10): 5000 [IU] via SUBCUTANEOUS
  Filled 2011-04-17 (×13): qty 1

## 2011-04-17 MED ORDER — PANTOPRAZOLE SODIUM 40 MG PO TBEC
40.0000 mg | DELAYED_RELEASE_TABLET | Freq: Every day | ORAL | Status: DC
  Administered 2011-04-17: 40 mg via ORAL
  Filled 2011-04-17: qty 1

## 2011-04-17 MED ORDER — POLYETHYLENE GLYCOL 3350 17 G PO PACK
17.0000 g | PACK | Freq: Every day | ORAL | Status: DC | PRN
  Filled 2011-04-17: qty 1

## 2011-04-17 MED ORDER — CHLORHEXIDINE GLUCONATE 0.12 % MT SOLN
15.0000 mL | Freq: Two times a day (BID) | OROMUCOSAL | Status: DC
  Administered 2011-04-17: 15 mL via OROMUCOSAL
  Filled 2011-04-17: qty 15

## 2011-04-17 MED ORDER — ONDANSETRON HCL 4 MG/2ML IJ SOLN
4.0000 mg | Freq: Four times a day (QID) | INTRAMUSCULAR | Status: DC | PRN
Start: 2011-04-17 — End: 2011-04-20

## 2011-04-17 MED ORDER — ACETAMINOPHEN 325 MG PO TABS
650.0000 mg | ORAL_TABLET | Freq: Four times a day (QID) | ORAL | Status: DC | PRN
  Administered 2011-04-17: 650 mg via ORAL
  Filled 2011-04-17: qty 2

## 2011-04-17 MED ORDER — MORPHINE SULFATE 2 MG/ML IJ SOLN
1.0000 mg | INTRAMUSCULAR | Status: DC | PRN
  Administered 2011-04-17 – 2011-04-18 (×4): 1 mg via INTRAVENOUS
  Filled 2011-04-17 (×4): qty 1

## 2011-04-17 MED ORDER — SENNA 8.6 MG PO TABS
1.0000 | ORAL_TABLET | Freq: Every day | ORAL | Status: DC
  Administered 2011-04-17 – 2011-04-20 (×4): 8.6 mg via ORAL
  Filled 2011-04-17 (×4): qty 1

## 2011-04-17 MED ORDER — HYDROMORPHONE HCL PF 1 MG/ML IJ SOLN
1.0000 mg | Freq: Once | INTRAMUSCULAR | Status: AC
  Administered 2011-04-17: 1 mg via INTRAVENOUS
  Filled 2011-04-17: qty 1

## 2011-04-17 MED ORDER — PIPERACILLIN-TAZOBACTAM 3.375 G IVPB 30 MIN
3.3750 g | INTRAVENOUS | Status: AC
  Administered 2011-04-17: 3.375 g via INTRAVENOUS
  Filled 2011-04-17: qty 50

## 2011-04-17 MED ORDER — SODIUM CHLORIDE 0.9 % IV SOLN
INTRAVENOUS | Status: DC
  Administered 2011-04-17 – 2011-04-18 (×3): via INTRAVENOUS

## 2011-04-17 MED ORDER — ONDANSETRON HCL 4 MG PO TABS: 4.0000 mg | ORAL_TABLET | Freq: Four times a day (QID) | ORAL | Status: DC | PRN

## 2011-04-17 MED ORDER — HYDROCODONE-ACETAMINOPHEN 10-325 MG PO TABS
1.0000 | ORAL_TABLET | Freq: Three times a day (TID) | ORAL | Status: DC
  Administered 2011-04-17 – 2011-04-18 (×3): 1 via ORAL
  Filled 2011-04-17 (×3): qty 1

## 2011-04-17 MED ORDER — METOPROLOL TARTRATE 1 MG/ML IV SOLN
5.0000 mg | Freq: Four times a day (QID) | INTRAVENOUS | Status: DC | PRN
  Filled 2011-04-17: qty 5

## 2011-04-17 MED ORDER — ACETAMINOPHEN 650 MG RE SUPP: 650.0000 mg | Freq: Four times a day (QID) | RECTAL | Status: DC | PRN

## 2011-04-17 NOTE — ED Notes (Signed)
Pt now rates abdominal pain at 3/10 and denies N/V.  No verbal complaints at this time.

## 2011-04-17 NOTE — ED Notes (Signed)
Medicated for pain per orders;

## 2011-04-17 NOTE — H&P (Signed)
FMTS Attending Admission Note: Dorcas Mcmurray MD [phone] pager office [phone] I  have seen and examined this patient, reviewed their chart. I have discussed this patient with the resident. I agree with the resident's findings, assessment and care plan. He already has outpatient GI physician, Dr Paulita Fujita, so follow up will be easily handled. His pain has improved already since admission.

## 2011-04-17 NOTE — Progress Notes (Signed)
I have seen and examined this patient and I agree with Dr. Thea Gist assessment and plan. In brief, this is a 55 y.o. Man with a known h/o of diverticulitis- here with reoccurrence.   I do not know that pt truly "failed" outpatient therapy since he only had 1 dose of antibiotic and returned to ER promptly due to continued pain.  Exam reassuring.  Pain very minimal on exam in the ER.  Started on zosyn last night but I feel if pt continues to have minimal abd pain- can quickly place back on cipro/flagyl regimen and advance diet as tolerated.    Shalana Jardin S. Luberta Mutter, MD PGY-3

## 2011-04-17 NOTE — Progress Notes (Signed)
FMTS Attending Daily Note: Lev Cervone MD [phone] pager office [phone] I  have seen and examined this patient, reviewed their chart. I have discussed this patient with the resident. I agree with the resident's findings, assessment and care plan. 

## 2011-04-17 NOTE — ED Notes (Addendum)
Called floor to give report and spoke with Izora Gala , Therapist, sports.  Pt prepared for transport.

## 2011-04-17 NOTE — ED Notes (Addendum)
Physician with Internal medicine at bedside for admission evaluation.  New orders received. Awaiting available bed.  Pt rates intermittent abdominal pain at 8/10, diffuse across lower quadrants.

## 2011-04-17 NOTE — Progress Notes (Signed)
Notified Dr. Adrian Blackwater of pt's temp. Of 101.2,BP 152/91,and pt on regular diet. New orders placed in chart.

## 2011-04-17 NOTE — ED Notes (Signed)
Pt received from [facility] via stretcher with abdominal pain and diagnosis of diverticulitis.  Pt rates pain at 8/10 that is constant.  He denies N/V at this time.  He is inquiring about antibiotics and pain medication, neither of which are ordered.  Sending  page to appropriate physician to inquire about orders.  Explained to the pt the lack of orders and process by which they are obtained.  He expresses verbal understanding.

## 2011-04-17 NOTE — H&P (Signed)
Marc Ponce is an 55 y.o. male.   Chief Complaint: Abdominal Pain  HPI:  Marc Ponce is a 55 year old male who was recently diagnosed with diverticulitis on 04/16/11. After approximately 1 week of lower abdominal pain and constipation, the patient presented to the ED at [facility] on 12/14. At the time a CT of the abdomen revealed sigmoid colon diverticulitis. The patient was treated with IV cipro x 1, and then discharge on 12/15 with PO cipro, flayl, and pain medication. Thereafter, he spent several hours at home. During this time he took his cipro, flagyl and pain medication. However, he started to have an increase in severe lower abdominal pain, diarrhea, and felt febrile. Therefore, he presented to the ED at [facility] for further evaluation and treatment. Currently, he notes that his pain is present but improved. It is still in his lower abdomen, worse on the left. He had not had a bowel movement in approximately 12 hours. He denies hematemesis. He denies nausea and vomiting,but notes decrease appetite times one week. Marc Ponce states that he had diverticulitis ten year ago that resolved with outpatient management.    PCP: [facility] - Dr. Laney [name]   Past Medical History  Diagnosis Date  . Hypertension   . Diverticulitis     History reviewed. No pertinent past surgical history.  History reviewed. No pertinent family history. Social History:  does not have a smoking history on file. He does not have any smokeless tobacco history on file. He reports that he does not drink alcohol or use illicit drugs.  Allergies: No Known Allergies  Medications Prior to Admission  Medication Dose Route Frequency Provider Last Rate Last Dose  . 0.9 %  sodium chloride infusion   Intravenous Once Saddie Benders. Ghim, MD      . 0.9 %  sodium chloride infusion   Intravenous Continuous Nat Christen, MD 125 mL/hr at 04/16/11 1941    . ciprofloxacin (CIPRO) IVPB 400 mg  400 mg Intravenous  Once Saddie Benders. Ghim, MD   400 mg at 04/16/11 0601  . fentaNYL (SUBLIMAZE) injection 100 mcg  100 mcg Intravenous Once Saddie Benders. Ghim, MD   100 mcg at 04/16/11 0328  . fentaNYL (SUBLIMAZE) injection 100 mcg  100 mcg Intravenous Once Saddie Benders. Ghim, MD   100 mcg at 04/16/11 0710  . HYDROmorphone (DILAUDID) injection 1 mg  1 mg Intravenous Once Nat Christen, MD   1 mg at 04/16/11 1941  . iohexol (OMNIPAQUE) 300 MG/ML solution 100 mL  100 mL Intravenous Once PRN Medication Radiologist   100 mL at 04/16/11 0311  . iohexol (OMNIPAQUE) 300 MG/ML solution 20 mL  20 mL Oral Q1 Hr x 2 Medication Radiologist   20 mL at 04/16/11 0230  . metroNIDAZOLE (FLAGYL) IVPB 500 mg  500 mg Intravenous Once Saddie Benders. Ghim, MD   500 mg at 04/16/11 0525  . morphine 4 MG/ML injection 4 mg  4 mg Intravenous Once Nat Christen, MD   4 mg at 04/16/11 2110  . ondansetron (ZOFRAN) injection 4 mg  4 mg Intravenous Once Nat Christen, MD   4 mg at 04/16/11 1941  . sodium chloride 0.9 % bolus 500 mL  500 mL Intravenous Once Nat Christen, MD   500 mL at 04/16/11 1939   No current outpatient prescriptions on file as of 04/16/2011.    Results for orders placed during the hospital encounter of 04/16/11 (from the past 48 hour(s))  CBC  Status: Normal   Collection Time   04/16/11  7:23 PM      Component Value Range Comment   WBC 8.6  4.0 - 10.5 (K/uL)    RBC 4.51  4.22 - 5.81 (MIL/uL)    Hemoglobin 15.2  13.0 - 17.0 (g/dL)    HCT 43.8  39.0 - 52.0 (%)    MCV 97.1  78.0 - 100.0 (fL)    MCH 33.7  26.0 - 34.0 (pg)    MCHC 34.7  30.0 - 36.0 (g/dL)    RDW 13.2  11.5 - 15.5 (%)    Platelets 172  150 - 400 (K/uL)   DIFFERENTIAL     Status: Abnormal   Collection Time   04/16/11  7:23 PM      Component Value Range Comment   Neutrophils Relative 83 (*) 43 - 77 (%)    Neutro Abs 7.2  1.7 - 7.7 (K/uL)    Lymphocytes Relative 5 (*) 12 - 46 (%)    Lymphs Abs 0.4 (*) 0.7 - 4.0 (K/uL)    Monocytes Relative 12  3 - 12 (%)    Monocytes  Absolute 1.0  0.1 - 1.0 (K/uL)    Eosinophils Relative 0  0 - 5 (%)    Eosinophils Absolute 0.0  0.0 - 0.7 (K/uL)    Basophils Relative 0  0 - 1 (%)    Basophils Absolute 0.0  0.0 - 0.1 (K/uL)   COMPREHENSIVE METABOLIC PANEL     Status: Abnormal   Collection Time   04/16/11  7:23 PM      Component Value Range Comment   Sodium 135  135 - 145 (mEq/L)    Potassium 3.9  3.5 - 5.1 (mEq/L)    Chloride 101  96 - 112 (mEq/L)    CO2 23  19 - 32 (mEq/L)    Glucose, Bld 150 (*) 70 - 99 (mg/dL)    BUN 16  6 - 23 (mg/dL)    Creatinine, Ser 1.22  0.50 - 1.35 (mg/dL)    Calcium 9.2  8.4 - 10.5 (mg/dL)    Total Protein 7.3  6.0 - 8.3 (g/dL)    Albumin 3.6  3.5 - 5.2 (g/dL)    AST 37  0 - 37 (U/L)    ALT 47  0 - 53 (U/L)    Alkaline Phosphatase 72  39 - 117 (U/L)    Total Bilirubin 1.8 (*) 0.3 - 1.2 (mg/dL)    GFR calc non Af Amer 66 (*) >90 (mL/min)    GFR calc Af Amer 76 (*) >90 (mL/min)   URINALYSIS, ROUTINE W REFLEX MICROSCOPIC     Status: Abnormal   Collection Time   04/16/11 11:07 PM      Component Value Range Comment   Color, Urine ORANGE (*) YELLOW  BIOCHEMICALS MAY BE AFFECTED BY COLOR   APPearance TURBID (*) CLEAR     Specific Gravity, Urine 1.035 (*) 1.005 - 1.030     pH 5.5  5.0 - 8.0     Glucose, UA NEGATIVE  NEGATIVE (mg/dL)    Hgb urine dipstick NEGATIVE  NEGATIVE     Bilirubin Urine MODERATE (*) NEGATIVE     Ketones, ur 15 (*) NEGATIVE (mg/dL)    Protein, ur NEGATIVE  NEGATIVE (mg/dL)    Urobilinogen, UA 4.0 (*) 0.0 - 1.0 (mg/dL)    Nitrite NEGATIVE  NEGATIVE     Leukocytes, UA SMALL (*) NEGATIVE    URINE MICROSCOPIC-ADD ON  Status: Abnormal   Collection Time   04/16/11 11:07 PM      Component Value Range Comment   Squamous Epithelial / LPF RARE  RARE     WBC, UA 0-2  <3 (WBC/hpf)    RBC / HPF 0-2  <3 (RBC/hpf)    Bacteria, UA MANY (*) RARE     Casts HYALINE CASTS (*) NEGATIVE     Urine-Other MUCOUS PRESENT      Ct Abdomen Pelvis W Contrast  04/16/2011   *RADIOLOGY REPORT*  Clinical Data: Abdominal pain  CT ABDOMEN AND PELVIS WITH CONTRAST  Technique:  Multidetector CT imaging of the abdomen and pelvis was performed following the standard protocol during bolus administration of intravenous contrast.  Contrast: 141m OMNIPAQUE IOHEXOL 300 MG/ML IV SOLN  Comparison: None.  Findings: Clear lung bases.  Normal heart size.  Fatty liver.  Cholelithiasis.  Hypodensity within the left hepatic lobe is too small further characterize.  No biliary ductal dilatation.  Unremarkable spleen, pancreas, adrenal glands.  Symmetric renal enhancement.  No hydronephrosis or hydroureter.  No bowel obstruction.  The sigmoid colon diverticulitis.  No loculated fluid collection or free intraperitoneal air.  No lymphadenopathy.  There is scattered atherosclerotic calcification of the aorta and its branches. No aneurysmal dilatation.  Thin-walled bladder.  Multilevel degenerative changes of the imaged spine. No acute or aggressive appearing osseous lesion.  IMPRESSION: Sigmoid colon diverticulitis.  No loculated fluid collection to suggest abscess.  No free intraperitoneal air.  Hepatic steatosis.  Original Report Authenticated By: ASuanne Marker M.D.   Dg Abd Acute W/chest  04/16/2011  *RADIOLOGY REPORT*  Clinical Data: Abdominal pain.  Rule out diverticulitis.  Rule out perforation  ACUTE ABDOMEN SERIES (ABDOMEN 2 VIEW & CHEST 1 VIEW)  Comparison: CT 04/16/2011  Findings: Cardiac enlargement without heart failure.  Mild bibasilar atelectasis.  Negative for pleural effusion.  Gas is present in the colon from recent CT.  Negative for bowel obstruction.  Mild ileus.  Negative for pneumoperitoneum.  No acute bony abnormality.  IMPRESSION: Mild bibasilar atelectasis.  Mild ileus.  Negative for pneumoperitoneum.  Original Report Authenticated By: DTruett Perna M.D.    ROS Positive: constipation, diarrhea, abdominal pain, fever, chills Negative: chest pain, shortness of breath,  bloody stools,    Blood pressure 135/74, pulse 114, temperature 100.1 F (37.8 C), temperature source Oral, resp. rate 24, SpO2 92.00%. Physical Exam  Constitutional: He appears well-developed and well-nourished.  Non-toxic appearance. No distress.       Obese body habitus  HENT:  Head: Normocephalic and atraumatic.  Mouth/Throat: Oropharynx is clear and moist.  Eyes: Pupils are equal, round, and reactive to light. No scleral icterus.  Neck: No JVD present.  Cardiovascular: Regular rhythm, normal heart sounds and intact distal pulses.  Tachycardia present.   Respiratory: Effort normal and breath sounds normal.  GI: He exhibits distension. He exhibits no mass. Bowel sounds are decreased. There is tenderness in the right lower quadrant, periumbilical area, left upper quadrant and left lower quadrant. There is guarding.  Musculoskeletal: Normal range of motion. He exhibits no edema.  Lymphadenopathy:    He has no cervical adenopathy.  Neurological: He is alert.  Skin: Skin is warm and dry.     Assessment/Plan Mr. WBadleyis a 55year old man with diverticulitis that failed outpatient management, but shows no signs of complications.   1. [facility]   2. GI - Diverticulitis  of Sigmoid Colon  - start Zosyn IV as second line therapy, as he did not improve with Cirpo/Flagyl  - Morphine PRN for pain  3. FENGI - NS IVF @ 125 ml/hr, clear liquid diet as tolerated; stool softener 4. PPX: Heparin 5000 U [place] daily; protonix 5. Dispo - pending clinical improvement and transition to PO antibiotics   Tahjae Clausing 04/17/2011, 12:25 AM

## 2011-04-17 NOTE — Progress Notes (Signed)
Family Medicine Teaching Service Daily Progress Note   Subjective: Patient reports that his abdominal pain is 6/10 at worst and 0/10 at best. He denies nausea vomiting. He does report worsening abdominal distention and new onset shortness of breath with exertion since last night.  Denies cough or chest pain. He has not had a bowel movement 2-3 days.   Objective: Vital signs in last 24 hours: Temp:  [99.2 F (37.3 C)-100.1 F (37.8 C)] 99.5 F (37.5 C) (12/16 0430) Pulse Rate:  [101-114] 105  (12/16 0430) Resp:  [20-37] 20  (12/16 0430) BP: (131-160)/(74-92) 148/84 mmHg (12/16 0430) SpO2:  [92 %-96 %] 92 % (12/16 0430) Weight:  [225 lb 4.8 oz (102.195 kg)] 225 lb 4.8 oz (102.195 kg) (12/16 0430) Weight change:  Last BM Date: 04/17/11  Intake/Output from previous day:   Intake/Output this shift: Total I/O In: 172.5 [I.V.:172.5] Out: -   General appearance: alert, cooperative and no distress Resp: Normal work of breathing. Clear to auscultation bilaterally. Cardio: regular rate and rhythm, S1, S2 normal, no murmur, click, rub or gallop GI: Full. Hypoactive bowel sounds. No fluid wave. Mild tenderness to palpation left lower quadrant. No rebound or guarding. Extremities: extremities normal, atraumatic, no cyanosis or edema  Pertinent Lab Results: WBC: 13-5 --> 11.3  H/H: 14/41.2 Electrolyte within normal limits Cr 1.22 --> 1.14   Studies/Results: No new.  Medications: I have reviewed the patient's current medications.  Assessment/Plan: 55 year old male with a known history of diverticulosis and previous bout of diverticulitis presents with recurrent diverticulitis complicated by constipation and moderate to severe abdominal pain.  1. Diverticulitis: A: Improved.  Pain is improved, white count is trending down the patient is afebrile. He is tolerating his clear liquid diet. Constipation is persistent. P: -Continue IV Zosyn. Plan to transition to by mouth Cipro and Flagyl  to complete a 10-14 day course tomorrow. -Continue IV morphine q. 2 when necessary. Add on scheduled Vicodin. -Decrease IV fluids to 50 cc per hour. -Schedule daily MiraLAX and Senokot. -Encouraged patient to walk halls to promote bowel movement.  2. Shortness of breath with exertion: A: Most likely secondary to worsening abdominal distention. There is no evidence of DVT or acute cardiopulmonary process on physical exam. P: -Encouraged to do spirometry. -Encouraged out of bed to chair. -Supple with oxygen as needed to maintain sats greater than 92. None required currently.  3. FEN/GI: Continue clear liquid diet. Decrease IV fluids 50 cc per hour. Recheck BMET in a.m.  4. DVT prophylaxis: Heparin 5000 units subcutaneous 3 times a day  5. Disposition: Pending continued clinical improvement, and patient successfully transitioned from IV pain medications to oral.    LOS: 1 day   Ailis Rigaud 04/17/2011, 11:25 AM

## 2011-04-18 MED ORDER — CIPROFLOXACIN HCL 500 MG PO TABS
500.0000 mg | ORAL_TABLET | Freq: Two times a day (BID) | ORAL | Status: DC
  Administered 2011-04-18 – 2011-04-20 (×4): 500 mg via ORAL
  Filled 2011-04-18 (×6): qty 1

## 2011-04-18 MED ORDER — METOPROLOL TARTRATE 1 MG/ML IV SOLN
5.0000 mg | Freq: Four times a day (QID) | INTRAVENOUS | Status: DC | PRN
  Filled 2011-04-18: qty 5

## 2011-04-18 MED ORDER — METRONIDAZOLE 500 MG PO TABS
500.0000 mg | ORAL_TABLET | Freq: Three times a day (TID) | ORAL | Status: DC
  Administered 2011-04-18 – 2011-04-20 (×6): 500 mg via ORAL
  Filled 2011-04-18 (×9): qty 1

## 2011-04-18 MED ORDER — POLYETHYLENE GLYCOL 3350 17 G PO PACK
17.0000 g | PACK | Freq: Four times a day (QID) | ORAL | Status: DC | PRN
  Administered 2011-04-18: 17 g via ORAL
  Filled 2011-04-18: qty 1

## 2011-04-18 MED ORDER — METRONIDAZOLE IN NACL 5-0.79 MG/ML-% IV SOLN
500.0000 mg | Freq: Three times a day (TID) | INTRAVENOUS | Status: DC
  Administered 2011-04-18: 500 mg via INTRAVENOUS
  Filled 2011-04-18 (×3): qty 100

## 2011-04-18 MED ORDER — LANSOPRAZOLE 15 MG PO TBDP
30.0000 mg | ORAL_TABLET | Freq: Every day | ORAL | Status: DC
  Administered 2011-04-18 – 2011-04-20 (×3): 30 mg via ORAL
  Filled 2011-04-18 (×3): qty 2

## 2011-04-18 MED ORDER — HYDROCODONE-ACETAMINOPHEN 5-325 MG PO TABS: 1.0000 | ORAL_TABLET | ORAL | Status: DC | PRN

## 2011-04-18 MED ORDER — KETOROLAC TROMETHAMINE 30 MG/ML IJ SOLN
30.0000 mg | Freq: Four times a day (QID) | INTRAMUSCULAR | Status: DC | PRN
  Administered 2011-04-18 – 2011-04-19 (×2): 30 mg via INTRAVENOUS
  Filled 2011-04-18 (×3): qty 1

## 2011-04-18 NOTE — Progress Notes (Signed)
[facility] Progress Note  Patient name: Marc Ponce Medical record number: [id number] Date of birth: [birth year] Age: 55 y.o. Gender: male    LOS: 2 days   Primary Care Provider: [name], MD, MD  Overnight Events: Improved abdominal pain this morning. Required morphine times one for breakthrough pain. Patient without flatus or bowel movement since admission. Patient refused overnight metoprolol. Patient afebrile since 2100 last night. Patient expressing concern this morning over increasing abdominal girth.  Objective: Vital signs in last 24 hours: Temp:  [98.8 F (37.1 C)-101.2 F (38.4 C)] 98.8 F (37.1 C) (12/17 0641) Pulse Rate:  [80-109] 100  (12/17 0641) Resp:  [20] 20  (12/17 0641) BP: (142-152)/(81-93) 152/93 mmHg (12/17 0641) SpO2:  [91 %-93 %] 93 % (12/17 0641)  Wt Readings from Last 3 Encounters:  04/17/11 225 lb 4.8 oz (102.195 kg)     Current Facility-Administered Medications  Medication Dose Route Frequency Provider Last Rate Last Dose  . 0.9 %  sodium chloride infusion   Intravenous Continuous Josalyn Funches 75 mL/hr at 04/18/11 0412    . acetaminophen (TYLENOL) tablet 650 mg  650 mg Oral Q6H PRN Dewain Penning, MD   650 mg at 04/17/11 2202   Or  . acetaminophen (TYLENOL) suppository 650 mg  650 mg Rectal Q6H PRN Dewain Penning, MD      . heparin injection 5,000 Units  5,000 Units Subcutaneous Q8H Dewain Penning, MD   5,000 Units at 04/18/11 0640  . HYDROcodone-acetaminophen (NORCO) 10-325 MG per tablet 1 tablet  1 tablet Oral Q8H Josalyn Funches   1 tablet at 04/18/11 0644  . metoprolol (LOPRESSOR) injection 5 mg  5 mg Intravenous Q6H PRN Josalyn Funches      . morphine 2 MG/ML injection 1 mg  1 mg Intravenous Q2H PRN Dewain Penning, MD   1 mg at 04/18/11 0423  . ondansetron (ZOFRAN) tablet 4 mg  4 mg Oral Q6H PRN Dewain Penning, MD       Or  . ondansetron [facility]) injection 4 mg  4 mg Intravenous Q6H  PRN Dewain Penning, MD      . pantoprazole (PROTONIX) EC tablet 40 mg  40 mg Oral Q1200 Dewain Penning, MD   40 mg at 04/17/11 1305  . piperacillin-tazobactam (ZOSYN) IVPB 3.375 g  3.375 g Intravenous Q8H Dewain Penning, MD   3.375 g at 04/18/11 0640  . polyethylene glycol (MIRALAX / GLYCOLAX) packet 17 g  17 g Oral Daily Josalyn Funches   17 g at 04/17/11 1306  . senna (SENOKOT) tablet 8.6 mg  1 tablet Oral Daily Josalyn Funches   8.6 mg at 04/17/11 1303  . DISCONTD: 0.9 %  sodium chloride infusion   Intravenous Continuous Nat Christen, MD 125 mL/hr at 04/16/11 1941    . DISCONTD: antiseptic oral rinse (BIOTENE) solution 15 mL  15 mL Mouth Rinse q12n4p Dorcas Mcmurray, MD      . DISCONTD: chlorhexidine (PERIDEX) 0.12 % solution 15 mL  15 mL Mouth Rinse BID Dorcas Mcmurray, MD   15 mL at 04/17/11 [phone]  . DISCONTD: polyethylene glycol (MIRALAX / GLYCOLAX) packet 17 g  17 g Oral Daily PRN Dewain Penning, MD         PE: Gen: No acute distress, well-nourished well-developed HEENT: Moist mucous membranes CV: Regular in rhythm, no murmurs rubs or gallops Res: Clear to auscultation bilaterally, normal effort Abd: Grossly distended, hypoactive bowel sounds, no fluid wave, tympanic Ext/Musc: No edema, 2+ pulses throughout  Neuro: Cranial nerves grossly intact  Labs/Studies:  None  Assessment/Plan: 55 year old male with a known history of diverticulosis and previous bout of diverticulitis no w/ recurrent diverticulitis complicated by constipation and improving abdominal pain.   1. Diverticulitis: CT confirmed. Pain significantly improved this am. Required morphine x1 overnight for breakthrough pain (x4 since admission). Pt afebrile since 21:00 yesterday. Diet changed from soft back to clears yesterday afternoon. Still w/ no BM since admission. Continue Miralax and Senokot. Patient on Zosyn, but will transition to Cipro and Flagyl for 1014 day course. Will transition to only PO pain medications and  will attempt to wean off of opioids as these will exacerbate constipation. Patient to ambulate as tolerated.   2. SOB: Likely secondary to acute abdominal distension. Pt encouraged to ambulate. No evidence of acute cardiopulmonary process. Distension likely secondary to diverticulitis and constipation. Pt encouraged to use incentive spirometer.   3. Cv: elevated BP and HR, likely secondary to abdominal pain. Pt reports baseline HTN. Pt refused metoprolol. Will continue w/ Metoprolol as needed. Will change parameters.   4. FEN/GI: Clear liquid diet. Will advance as tolerated. Will decrease IVF as PO returns. Pt w/ reflux on protonix. Pt reports taking prevacid at home w/ no symptoms. Will change protonix to prevacid   5. DVT prophylaxis: Heparin 5000 units subcutaneous 3 times a day   6. Disposition: Pending continued clinical improvement, and patient successfully transitioned from IV pain medications to oral.   Signed: Linna Darner, MD Family Medicine Resident PGY-1 04/18/2011 9:23 AM   Attending Note:  Patient seen and examined by me, discussed with resident team.  I agree with Dr. Baker Janus assessment and plan as detailed above.  Patient with diagnosis diverticulitis, on Cipro and Flagyl, constipation and distention.  I suspect opioid analgesics have contributed substantially to his distention and functional ileus.  He describes his abdominal pain now as being associated with position change and abdominal wall pain, rather than the sharp visceral pain he experienced previously.  Mykenzi Vanzile O

## 2011-04-19 DIAGNOSIS — F1010 Alcohol abuse, uncomplicated: Secondary | ICD-10-CM | POA: Insufficient documentation

## 2011-04-19 DIAGNOSIS — K7581 Nonalcoholic steatohepatitis (NASH): Secondary | ICD-10-CM | POA: Insufficient documentation

## 2011-04-19 LAB — BASIC METABOLIC PANEL
CO2: 22 mEq/L (ref 19–32)
Calcium: 9 mg/dL (ref 8.4–10.5)
Chloride: 103 mEq/L (ref 96–112)
Glucose, Bld: 130 mg/dL — ABNORMAL HIGH (ref 70–99)
Potassium: 3.4 mEq/L — ABNORMAL LOW (ref 3.5–5.1)
Sodium: 138 mEq/L (ref 135–145)

## 2011-04-19 MED ORDER — POTASSIUM CHLORIDE CRYS ER 20 MEQ PO TBCR
40.0000 meq | EXTENDED_RELEASE_TABLET | Freq: Two times a day (BID) | ORAL | Status: DC
  Administered 2011-04-19 – 2011-04-20 (×2): 40 meq via ORAL
  Filled 2011-04-19 (×4): qty 2

## 2011-04-19 MED ORDER — SODIUM CHLORIDE 0.9 % IV SOLN
INTRAVENOUS | Status: DC
  Administered 2011-04-19 (×2): via INTRAVENOUS

## 2011-04-19 NOTE — Progress Notes (Addendum)
[facility] Progress Note  Patient name: Marc Ponce Medical record number: [id number] Date of birth: 08/20/1955 Age: 55 y.o. Gender: male    LOS: 3 days   Primary Care Provider: [name], MD, MD  Overnight Events: No acute events overnight. Slept well. Pain well-controlled as patient only required Toradol once overnight. Patient has not taken Norco and greater than 24 hours. Patient tolerating regular diet. A small bowel movement with flatus this morning. Patient ambulating considerably.   Objective: Vital signs in last 24 hours: Temp:  [98.4 F (36.9 C)-100.3 F (37.9 C)] 99.8 F (37.7 C) (12/18 0517) Pulse Rate:  [91-106] 95  (12/18 0517) Resp:  [18-21] 21  (12/18 0517) BP: (132-160)/(80-91) 132/80 mmHg (12/18 0517) SpO2:  [94 %-96 %] 94 % (12/18 0517)  Wt Readings from Last 3 Encounters:  04/17/11 225 lb 4.8 oz (102.195 kg)     Current Facility-Administered Medications  Medication Dose Route Frequency Provider Last Rate Last Dose  . acetaminophen (TYLENOL) tablet 650 mg  650 mg Oral Q6H PRN Dewain Penning, MD   650 mg at 04/17/11 2202   Or  . acetaminophen (TYLENOL) suppository 650 mg  650 mg Rectal Q6H PRN Dewain Penning, MD      . ciprofloxacin (CIPRO) tablet 500 mg  500 mg Oral BID Linna Darner, MD   500 mg at 04/19/11 0759  . heparin injection 5,000 Units  5,000 Units Subcutaneous Q8H Dewain Penning, MD   5,000 Units at 04/19/11 0620  . HYDROcodone-acetaminophen (NORCO) 5-325 MG per tablet 1 tablet  1 tablet Oral Q4H PRN Linna Darner, MD      . ketorolac (TORADOL) 30 MG/ML injection 30 mg  30 mg Intravenous Q6H PRN Linna Darner, MD   30 mg at 04/18/11 1604  . lansoprazole (PREVACID SOLUTAB) disintegrating tablet 30 mg  30 mg Oral Q1200 Linna Darner, MD   30 mg at 04/18/11 1241  . metoprolol (LOPRESSOR) injection 5 mg  5 mg Intravenous Q6H PRN Linna Darner, MD      . metroNIDAZOLE (FLAGYL) tablet 500 mg  500 mg  Oral Q8H Linna Darner, MD   500 mg at 04/19/11 [phone]  . ondansetron (ZOFRAN) tablet 4 mg  4 mg Oral Q6H PRN Dewain Penning, MD       Or  . ondansetron [facility]) injection 4 mg  4 mg Intravenous Q6H PRN Dewain Penning, MD      . polyethylene glycol (MIRALAX / GLYCOLAX) packet 17 g  17 g Oral Q6H PRN Linna Darner, MD   17 g at 04/18/11 2133  . senna (SENOKOT) tablet 8.6 mg  1 tablet Oral Daily Josalyn Funches   8.6 mg at 04/18/11 0938  . DISCONTD: 0.9 %  sodium chloride infusion   Intravenous Continuous Linna Darner, MD 50 mL/hr at 04/18/11 1110    . DISCONTD: HYDROcodone-acetaminophen (NORCO) 10-325 MG per tablet 1 tablet  1 tablet Oral Q8H Josalyn Funches   1 tablet at 04/18/11 0644  . DISCONTD: metoprolol (LOPRESSOR) injection 5 mg  5 mg Intravenous Q6H PRN Josalyn Funches      . DISCONTD: metroNIDAZOLE (FLAGYL) IVPB 500 mg  500 mg Intravenous Q8H Linna Darner, MD   500 mg at 04/18/11 1430  . DISCONTD: morphine 2 MG/ML injection 1 mg  1 mg Intravenous Q2H PRN Dewain Penning, MD   1 mg at 04/18/11 0423  . DISCONTD: pantoprazole (PROTONIX) EC tablet 40 mg  40 mg Oral Q1200 Dewain Penning, MD   40  mg at 04/17/11 1305  . DISCONTD: piperacillin-tazobactam (ZOSYN) IVPB 3.375 g  3.375 g Intravenous Q8H Dewain Penning, MD   3.375 g at 04/18/11 0640  . DISCONTD: polyethylene glycol (MIRALAX / GLYCOLAX) packet 17 g  17 g Oral Daily Josalyn Funches   17 g at 04/18/11 1610     PE: Gen: No acute distress, well-nourished well-developed  HEENT: Moist mucous membranes  CV: Regular in rhythm, no murmurs rubs or gallops  Res: Clear to auscultation bilaterally, normal effort  Abd: Grossly distended, hypoactive bowel sounds, no fluid wave, tympanic  Ext/Musc: No edema, 2+ pulses throughout  Neuro: Cranial nerves grossly intact  Labs/Studies:  None  Assessment/Plan: 55 year old male with a known history of diverticulosis and previous bout of diverticulitis now w/ recurrent diverticulitis  complicated by constipation and improving abdominal pain.   1. Diverticulitis: CT confirmed. Pain significantly improved this am. Last pain medication Toradol last night. Required morphine x1 overnight for breakthrough pain (x4 since admission). Pt afebrile since 21:00 Sunday. Tolerating regular diet. Flatus and one small BM per report this morning. Continue Miralax and Senokot. Continue Cipro and Flagyl for 10-14 day course. Patient to ambulate as tolerated.   2. SOB: Likely secondary to acute abdominal distension. Pt encouraged to ambulate. No evidence of acute cardiopulmonary process. Distension likely secondary to diverticulitis and constipation. Pt encouraged to use incentive spirometer.   3. Cv: elevated BP and HR, likely secondary to abdominal pain. Pt reports baseline HTN. Pt refused metoprolol. Will continue w/ Metoprolol as needed. Will change parameters.   4. FEN/GI: Regualr diet.  Will restart IVF to 125 mL/hr of NS as Pt reporting low dark colored urine. Will continue prevacid. BMET this am.  5. DVT prophylaxis: Heparin 5000 units subcutaneous 3 times a day   6. Disposition: Pending continued clinical improvement, and patient successfully transitioned from IV pain medications to oral.    Signed: Linna Darner, MD Family Medicine Resident PGY-1 04/19/2011 9:27 AM

## 2011-04-19 NOTE — Progress Notes (Signed)
Attending Progress Note Seen and examined by me, is sitting in recliner.  Tolerated cereal and fruit cup for breakfast.  Has had 2 episodes of voluminous flatus with small stool.  Abdominal distention and discomfort much improved.  Ambulating on the floor regularly.  Assess/Plan: Admitted with diverticulitis, now with functional ileus likely secondary to opioid pain medications.  Has not required any pain meds since last night, when he took a Toradol.  Plan to continue Cipro and Flagyl, slow advance of diet.  Continue ambulation.  Aidan Caloca O

## 2011-04-19 NOTE — Progress Notes (Signed)
Pt's BP 167/91...MD notified.  Will continue to monitor.  Eliezer [place]

## 2011-04-20 MED ORDER — CIPROFLOXACIN HCL 500 MG PO TABS: 500.0000 mg | ORAL_TABLET | Freq: Two times a day (BID) | ORAL | Status: AC

## 2011-04-20 MED ORDER — METRONIDAZOLE 500 MG PO TABS: 500.0000 mg | ORAL_TABLET | Freq: Three times a day (TID) | ORAL | Status: AC

## 2011-04-20 NOTE — Discharge Summary (Signed)
Family Medicine Resident Discharge Summary  Patient ID: Marc Ponce [id number] 55 y.o. 17-Oct-1955  Admit date: 04/16/2011  Discharge date and time: 04/20/11  Admitting Physician: Dorcas Mcmurray, MD   Discharge Physician: Dalbert Mayotte, MD  Admission Diagnoses: Diverticulitis [562.11] abd pain  Discharge Diagnoses: Diverticulitis  Admission Condition: fair  Discharged Condition: good  Indication for Admission: Abdominal Pain, Fever, Constipation, CT confirmed diverticulitis  Hospital Course: [name] is a 55yo male w/ Pmhx of diverticulitis and HTN who was admitted after ~1wk h/o lower abdominal pain and constipation, w/ new onset fever and CT confirmed sigmoid diverticulitis.   Diverticulitis: Pt initially started on Zosyn as he had started treatment for diverticulitis as an outpt on Cipro and Flagyl w/ no improvement in symptoms. This was changed to following day back to Cipro and Flagyl as pt improved. Pt pain was well controlled throughout admission Pt initially received predominantly opioid analgesia but was weaned to toradol at time of DC. Pt was w/o BM until the 18th, but had return of regular bowel habits at time of discharge. Pt given Miralax and Senekot for constipation during admission.   HTN: Pt noted to be hypertensive to the 140-160s and tachycardic throughout hospitalization. BP and tachycardia improved slightly as pain improved. Recommended pt f/u as outpt.   Consults: none  Significant Diagnostic Studies: Admission   WBC: 13.5  AST: 53  ALT: 57  UA Normal  CT Abdomen: Sigmoid colon diverticulitis. No loculated fluid collection to suggest abscess. No free    intraperitoneal air.   Discharge Exam: Gen: No acute distress, well-nourished well-developed  HEENT: Moist mucous membranes  CV: Regular rate and rhythm, no murmurs rubs or gallops  Res: Clear to auscultation bilaterally, normal effort  Abd: Mildly distended, bowel sounds diminished, no fluid  wave, tympanic  Ext/Musc: No edema, 2+ pulses throughout  Neuro: Cranial nerves grossly intact   Disposition: home  Patient Instructions:  Current Discharge Medication List    CONTINUE these medications which have NOT CHANGED   Details  ciprofloxacin (CIPRO) 500 MG tablet Take 500 mg by mouth 2 (two) times daily.      HYDROcodone-acetaminophen (NORCO) 5-325 MG per tablet Take 1-2 tablets by mouth every 6 (six) hours as needed. 1-2 tablets po q 6 hours prn pain     lansoprazole (PREVACID) 30 MG capsule Take 30 mg by mouth daily.      metroNIDAZOLE (FLAGYL) 500 MG tablet Take 500 mg by mouth 3 (three) times daily.         Activity: activity as tolerated Diet: regular diet Wound Care: none needed  Follow-up with Dr. Laney [name] within the next few days..  Follow-up Items: 1. HTN 2. Diverticulitis 3. Etoh use (pt admitted to 3-6 glasses of wine a night).  Signed: Linna Darner, MD Family Medicine Resident PGY-1 04/20/2011 9:29 AM

## 2011-04-22 ENCOUNTER — Ambulatory Visit (INDEPENDENT_AMBULATORY_CARE_PROVIDER_SITE_OTHER): Payer: Managed Care, Other (non HMO)

## 2011-04-22 DIAGNOSIS — I10 Essential (primary) hypertension: Secondary | ICD-10-CM

## 2011-04-22 DIAGNOSIS — K219 Gastro-esophageal reflux disease without esophagitis: Secondary | ICD-10-CM

## 2011-04-22 DIAGNOSIS — N529 Male erectile dysfunction, unspecified: Secondary | ICD-10-CM

## 2011-04-22 DIAGNOSIS — R1084 Generalized abdominal pain: Secondary | ICD-10-CM

## 2011-05-23 ENCOUNTER — Ambulatory Visit (INDEPENDENT_AMBULATORY_CARE_PROVIDER_SITE_OTHER): Payer: Managed Care, Other (non HMO)

## 2011-05-23 DIAGNOSIS — I10 Essential (primary) hypertension: Secondary | ICD-10-CM

## 2011-05-23 DIAGNOSIS — R7309 Other abnormal glucose: Secondary | ICD-10-CM

## 2011-05-23 DIAGNOSIS — E236 Other disorders of pituitary gland: Secondary | ICD-10-CM

## 2011-05-24 ENCOUNTER — Encounter (INDEPENDENT_AMBULATORY_CARE_PROVIDER_SITE_OTHER): Payer: Self-pay | Admitting: Surgery

## 2011-05-24 ENCOUNTER — Ambulatory Visit (INDEPENDENT_AMBULATORY_CARE_PROVIDER_SITE_OTHER): Payer: Managed Care, Other (non HMO) | Admitting: Surgery

## 2011-05-24 VITALS — BP 148/90 | HR 76 | Temp 98.3°F | Resp 18 | Ht 68.0 in | Wt 204.4 lb

## 2011-05-24 DIAGNOSIS — K6289 Other specified diseases of anus and rectum: Secondary | ICD-10-CM

## 2011-05-24 NOTE — Progress Notes (Signed)
Patient ID: Marc Ponce, male   DOB: [birth year], 56 y.o.   MRN: [id number]  Chief Complaint  Patient presents with  . Follow-up    Peri-rectal abcscess    HPI Marc Ponce is a 56 y.o. male. Referred by Dr. Sonia Baller for evaluation of perirectal abscess.   He was recently hospitalized for diverticulitis and has completed a course of Cipro and Flagyl.  Over the last two weeks, he has noted some perirectal discomfort and difficulty with bowel movements.  He denies any gross blood.  He had a digital rectal examination yesterday which elicited severe pain to the left side just above the dentate line.  He is referred for possible perirectal abscess.  His abdominal pain seems to have resolved. HPI  Past Medical History  Diagnosis Date  . Hypertension   . Diverticulitis   . GERD (gastroesophageal reflux disease)   . Rectal pain   . Peri-rectal abscess     Past Surgical History  Procedure Date  . Shoulder surgery 01/12/2010    Left   . Plate insertion 9379, 2011    humerus 1977, femer 2011    History reviewed. No pertinent family history.  Social History History  Substance Use Topics  . Smoking status: Former Smoker    Quit date: 04/01/1986  . Smokeless tobacco: Never Used  . Alcohol Use: Yes     3 per day    No Known Allergies  Current Outpatient Prescriptions  Medication Sig Dispense Refill  . lansoprazole (PREVACID) 30 MG capsule Take 30 mg by mouth daily.          Review of Systems Review of Systems  Constitutional: Negative for fever, chills and unexpected weight change.  HENT: Negative for hearing loss, congestion, sore throat, trouble swallowing and voice change.   Eyes: Negative for visual disturbance.  Respiratory: Negative for cough and wheezing.   Cardiovascular: Negative for chest pain, palpitations and leg swelling.  Gastrointestinal: Positive for rectal pain. Negative for nausea, vomiting, abdominal pain, diarrhea, constipation, blood in stool,  abdominal distention and anal bleeding.  Genitourinary: Negative for hematuria and difficulty urinating.  Musculoskeletal: Negative for arthralgias.  Skin: Negative for rash and wound.  Neurological: Negative for seizures, syncope, weakness and headaches.  Hematological: Negative for adenopathy. Does not bruise/bleed easily.  Psychiatric/Behavioral: Negative for confusion.    Blood pressure 148/90, pulse 76, temperature 98.3 F (36.8 C), temperature source Temporal, resp. rate 18, height 5' 8"  (1.727 m), weight 204 lb 6.4 oz (92.715 kg).  Physical Exam Physical Exam WDWN in NAD HEENT:  EOMI, sclera anicteric Neck:  No masses, no thyromegaly Lungs:  CTA bilaterally; normal respiratory effort CV:  Regular rate and rhythm; no murmurs Abd:  +bowel sounds, soft, non-tender, no masses Rectal - moderate external skin irritation with no tenderness No sign of external hemorrhoids, fistula, fissure On DRE, there is a 1 cm area of significant tenderness to the left side, just above the dentate line.  No obvious fluctuance Ext:  Well-perfused; no edema Skin:  Warm, dry; no sign of jaundice  Data Reviewed None  Assessment    Due to the fact that this area has been tender for over two weeks, the likelihood of abscess is relatively low.  However, he is fairly tender here.  Too tender for anoscopic examination.  We will check a CT scan of the pelvis with contrast to rule out a deep abscess.  If there are any abnormal findings, we may need to plan an examination  under anesthesia to possibly excise/ debride this area.    Plan    CT Pelvis We will call the patient after the scan is complete.       Jadian Karman K. 05/24/2011, 5:12 PM

## 2011-05-25 ENCOUNTER — Ambulatory Visit
Admission: RE | Admit: 2011-05-25 | Discharge: 2011-05-25 | Disposition: A | Discharge status: Home / Self Care | Payer: Managed Care, Other (non HMO) | Source: Ambulatory Visit | Attending: Surgery | Admitting: Surgery

## 2011-05-25 DIAGNOSIS — K6289 Other specified diseases of anus and rectum: Secondary | ICD-10-CM

## 2011-05-26 ENCOUNTER — Other Ambulatory Visit (INDEPENDENT_AMBULATORY_CARE_PROVIDER_SITE_OTHER): Payer: Self-pay | Admitting: Surgery

## 2011-05-26 DIAGNOSIS — K6289 Other specified diseases of anus and rectum: Secondary | ICD-10-CM

## 2011-05-26 MED ORDER — HYDROCORTISONE ACETATE 25 MG RE SUPP: 25.0000 mg | Freq: Two times a day (BID) | RECTAL | Status: DC

## 2011-05-26 NOTE — Progress Notes (Signed)
CT scan unremarkable; no perirectal abscess  Will try anusol HC suppositories.  Recheck in 2-3 weeks.  Imogene Burn. Georgette Dover, MD, [facility]  05/26/2011 12:12 PM

## 2011-06-06 ENCOUNTER — Other Ambulatory Visit (INDEPENDENT_AMBULATORY_CARE_PROVIDER_SITE_OTHER): Payer: Self-pay | Admitting: Surgery

## 2011-06-06 NOTE — Telephone Encounter (Signed)
Can this pt have a refill on this med

## 2011-06-14 ENCOUNTER — Ambulatory Visit (INDEPENDENT_AMBULATORY_CARE_PROVIDER_SITE_OTHER): Payer: Managed Care, Other (non HMO) | Admitting: Surgery

## 2011-06-14 ENCOUNTER — Encounter (INDEPENDENT_AMBULATORY_CARE_PROVIDER_SITE_OTHER): Payer: Self-pay | Admitting: Surgery

## 2011-06-14 VITALS — BP 152/90 | HR 72 | Temp 97.8°F | Resp 18 | Wt 206.0 lb

## 2011-06-14 DIAGNOSIS — K6289 Other specified diseases of anus and rectum: Secondary | ICD-10-CM

## 2011-06-14 NOTE — Progress Notes (Signed)
The patient returns for reevaluation of his rectal pain. A CT scan showed no sign of perirectal abscess. The internal pain is much improved. He is having regular bowel movements with minimal difficulty. He is having a lot of external itching and burning. He has been using Chlortrimazole/betamethasone ointment externally. Occasionally he uses Anusol suppositories.  Filed Vitals:   06/14/11 1024  BP: 152/90  Pulse: 72  Temp: 97.8 F (36.6 C)  Resp: 18    On physical examination the patient has a large amount of external skin excoriation. There are no significant external hemorrhoids. On the left side he has a much smaller internal hemorrhoid that is less tender. No gross bleeding.  Impression: Painful internal hemorrhoid which is much improved. Pruritus ani with external skin excoriation.  Recommendations: The patient may continue using fiber supplements and stool softeners as needed. Anusoll suppositories p.r.n. for the internal hemorrhoid. Externally the patient needs to keep this area as dry as possible. The moisture and frequent use of ointment was keep this area moist and sweaty which provides an ideal environment for yeast and bacteria. I encouraged him to use sitz baths after bowel movements and then he should get his perineum as dry as possible. He may consider using gauze pads to wick any moisture away from his anus.

## 2011-06-16 ENCOUNTER — Encounter (INDEPENDENT_AMBULATORY_CARE_PROVIDER_SITE_OTHER): Payer: Self-pay

## 2011-07-26 ENCOUNTER — Ambulatory Visit (INDEPENDENT_AMBULATORY_CARE_PROVIDER_SITE_OTHER): Payer: Managed Care, Other (non HMO) | Admitting: Surgery

## 2011-07-26 ENCOUNTER — Encounter (INDEPENDENT_AMBULATORY_CARE_PROVIDER_SITE_OTHER): Payer: Self-pay | Admitting: Surgery

## 2011-07-26 VITALS — BP 178/106 | HR 84 | Temp 98.0°F | Resp 16 | Ht 68.0 in | Wt 209.8 lb

## 2011-07-26 DIAGNOSIS — K6289 Other specified diseases of anus and rectum: Secondary | ICD-10-CM

## 2011-07-26 NOTE — Progress Notes (Signed)
Recheck for perianal pain/ pruritus ani - much improved.  No further pain.  He had an episode a couple of weeks ago where he felt some external swelling which then popped and drained some purulent fluid.  He still has a little bit of itching in the posterior midline.  Filed Vitals:   07/26/11 1028  BP: 178/106  Pulse: 84  Temp: 98 F (36.7 C)  Resp: 16   Exam:  Still has some perianal irritation - some excoriation in the posterior midline.  Small punctate in the left posterior location - possibly a small perianal abscess that spontaneously ruptured.    Imp:  Pruritus ani - improving Recs:  Continue to keep this area as clean and dry as possible.  Avoid constipation.  Follow-up PRN  Imogene Burn. Georgette Dover, MD, [facility]  07/26/2011 10:58 AM

## 2011-08-28 IMAGING — NM NM HEPATO W/GB/PHARM/[PERSON_NAME]
2 series · 12 of 12 positions shown · non-contrast
Comparison: Ultrasound 02/23/1999

CLINICAL DATA: Abdominal pain and tenderness

NUCLEAR MEDICINE HEPATOBILIARY IMAGING WITH GALLBLADDER EF
TECHNIQUE: Sequential images of the abdomen were obtained [DATE]
minutes following intravenous administration of
radiopharmaceutical.  After slow intravenous infusion of 2.0 ucg
Cholecystokinin, gallbladder ejection fraction was determined.
Radiopharmaceutical:  5.2 mCi 8c-LLm Choletec

[Series 1: he hepato · 4.75mm/px · 6 of 30 frames shown (1 of 2)]
[frame 3/30]
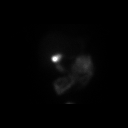
[frame 8/30]
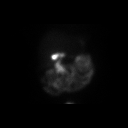
[frame 13/30]
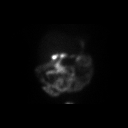
[frame 18/30]
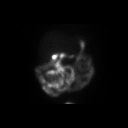
[frame 23/30]
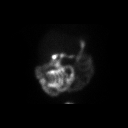
[frame 28/30]
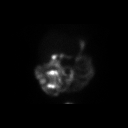

[Series 1: he hepato · 4.75mm/px · 6 of 60 frames shown (2 of 2)]
[frame 6/60]
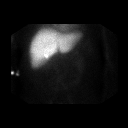
[frame 16/60]
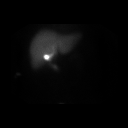
[frame 26/60]
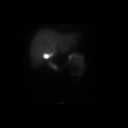
[frame 36/60]
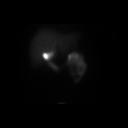
[frame 46/60]
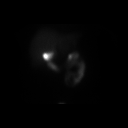
[frame 56/60]
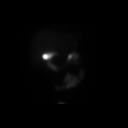

[12 of 12 positions shown; findings below may reference images not displayed]

FINDINGS: There is prompt extraction of radiotracer from the blood
pool and homogeneous uptake within the liver.  The gallbladder is
evident by 15 minutes.  Counts are present within the bowel by 20
minutes.  Upon administration of a cholecystokinin, the gallbladder
contracts appropriately with a calculated ejection fraction = 61%
at 30 min (Normal greater than 30 % ejection).
IMPRESSION: 1. Normal gallbladder ejection fraction =  61%.
2. Patent cystic duct and common bile duct.

## 2013-03-11 ENCOUNTER — Ambulatory Visit (INDEPENDENT_AMBULATORY_CARE_PROVIDER_SITE_OTHER): Payer: Managed Care, Other (non HMO) | Admitting: Internal Medicine

## 2013-03-11 VITALS — BP 218/112 | HR 96 | Temp 98.5°F | Resp 20 | Ht 66.0 in | Wt 223.2 lb

## 2013-03-11 DIAGNOSIS — M25569 Pain in unspecified knee: Secondary | ICD-10-CM

## 2013-03-11 DIAGNOSIS — I10 Essential (primary) hypertension: Secondary | ICD-10-CM

## 2013-03-11 DIAGNOSIS — R61 Generalized hyperhidrosis: Secondary | ICD-10-CM

## 2013-03-11 DIAGNOSIS — G8929 Other chronic pain: Secondary | ICD-10-CM

## 2013-03-11 DIAGNOSIS — R0683 Snoring: Secondary | ICD-10-CM

## 2013-03-11 DIAGNOSIS — G4710 Hypersomnia, unspecified: Secondary | ICD-10-CM

## 2013-03-11 DIAGNOSIS — E785 Hyperlipidemia, unspecified: Secondary | ICD-10-CM

## 2013-03-11 DIAGNOSIS — R7309 Other abnormal glucose: Secondary | ICD-10-CM

## 2013-03-11 DIAGNOSIS — R739 Hyperglycemia, unspecified: Secondary | ICD-10-CM

## 2013-03-11 LAB — POCT CBC
Hemoglobin: 16.4 g/dL (ref 14.1–18.1)
Lymph, poc: 2.2 (ref 0.6–3.4)
MCH, POC: 32.5 pg — AB (ref 27–31.2)
MCHC: 31.9 g/dL (ref 31.8–35.4)
MPV: 9.6 fL (ref 0–99.8)
POC Granulocyte: 5 (ref 2–6.9)
POC LYMPH PERCENT: 28.7 %L (ref 10–50)
POC MID %: 7.4 %M (ref 0–12)
RDW, POC: 13.5 %
WBC: 7.8 10*3/uL (ref 4.6–10.2)

## 2013-03-11 LAB — COMPREHENSIVE METABOLIC PANEL
AST: 64 U/L — ABNORMAL HIGH (ref 0–37)
BUN: 11 mg/dL (ref 6–23)
CO2: 24 mEq/L (ref 19–32)
Calcium: 9.4 mg/dL (ref 8.4–10.5)
Chloride: 104 mEq/L (ref 96–112)
Creat: 1.04 mg/dL (ref 0.50–1.35)
Total Bilirubin: 0.5 mg/dL (ref 0.3–1.2)

## 2013-03-11 LAB — LIPID PANEL
Cholesterol: 239 mg/dL — ABNORMAL HIGH (ref 0–200)
HDL: 52 mg/dL (ref >39)
Total CHOL/HDL Ratio: 4.6 Ratio
VLDL: 80 mg/dL — ABNORMAL HIGH (ref 0–40)

## 2013-03-11 LAB — POCT GLYCOSYLATED HEMOGLOBIN (HGB A1C): Hemoglobin A1C: 5.3

## 2013-03-11 MED ORDER — LISINOPRIL-HYDROCHLOROTHIAZIDE 20-12.5 MG PO TABS: 1.0000 | ORAL_TABLET | Freq: Every day | ORAL | Status: DC
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
## 2013-03-12 ENCOUNTER — Encounter: Payer: Self-pay | Admitting: Internal Medicine

## 2013-03-12 DIAGNOSIS — E785 Hyperlipidemia, unspecified: Secondary | ICD-10-CM | POA: Insufficient documentation

## 2013-03-12 DIAGNOSIS — I10 Essential (primary) hypertension: Secondary | ICD-10-CM | POA: Insufficient documentation

## 2013-03-13 NOTE — Progress Notes (Signed)
Appointment made for 12/10 @ 12:30 pm.

## 2013-03-19 ENCOUNTER — Encounter: Payer: Self-pay | Admitting: Internal Medicine

## 2013-03-27 ENCOUNTER — Encounter: Payer: Self-pay | Admitting: Internal Medicine

## 2013-03-27 DIAGNOSIS — I10 Essential (primary) hypertension: Secondary | ICD-10-CM

## 2013-03-27 MED ORDER — AMLODIPINE BESYLATE 10 MG PO TABS: 10.0000 mg | ORAL_TABLET | Freq: Every day | ORAL | Status: DC

## 2013-03-27 NOTE — Telephone Encounter (Signed)
Add norvasc 10 Send results in 10d

## 2013-04-02 ENCOUNTER — Encounter: Payer: Self-pay | Admitting: Internal Medicine

## 2013-04-02 DIAGNOSIS — M25562 Pain in left knee: Secondary | ICD-10-CM

## 2013-04-10 ENCOUNTER — Ambulatory Visit (INDEPENDENT_AMBULATORY_CARE_PROVIDER_SITE_OTHER): Payer: Managed Care, Other (non HMO) | Admitting: Internal Medicine

## 2013-04-10 ENCOUNTER — Encounter: Payer: Self-pay | Admitting: Internal Medicine

## 2013-04-10 VITALS — BP 158/95 | HR 93 | Temp 97.6°F | Resp 16 | Ht 65.75 in | Wt 223.0 lb

## 2013-04-10 DIAGNOSIS — I10 Essential (primary) hypertension: Secondary | ICD-10-CM

## 2013-04-10 DIAGNOSIS — E669 Obesity, unspecified: Secondary | ICD-10-CM

## 2013-04-10 DIAGNOSIS — E785 Hyperlipidemia, unspecified: Secondary | ICD-10-CM

## 2013-04-10 MED ORDER — ATORVASTATIN CALCIUM 20 MG PO TABS: 20.0000 mg | ORAL_TABLET | Freq: Every day | ORAL | Status: DC

## 2013-04-10 MED ORDER — LISINOPRIL 20 MG PO TABS: 20.0000 mg | ORAL_TABLET | Freq: Every day | ORAL | Status: DC

## 2013-04-10 NOTE — Progress Notes (Signed)
   Subjective:    Patient ID: Marc Ponce, male    DOB: 1955/05/28, 57 y.o.   MRN: [id number]  HPI Follow up for elevated blood pressure. Was started 1 month ago on zestoretic. Started checking BP at home and brings readings in today. Currently takes bp meds in morning. Checks bp around the same time as he takes meds and readings are higher in the morning than the afternoon. Recognizes importance of weight loss, need to decrease portions. No medication side effects noted. Feels more relaxed since addition of zestoretic. In general, BP are all mildly elevated.  Going to Dr. Onnie Graham this week for knee evaluation. Had knee lock up in the middle of the night two weeks ago with severe pain under knee cap. Pain has gotten better. Has very difficult time getting up from standing. Has been warm to the touch, no swelling. Has been unable to walk for exercise due to pain.   See labs from 11/14-needs Rx(?NASH as well)  Review of Systems No chest pain or palp   no edema No HA  Objective:   Physical Exam  Nursing note and vitals reviewed. Constitutional: He is oriented to person, place, and time. He appears well-developed and well-nourished.  Eyes: Right eye exhibits no discharge. Left eye exhibits no discharge.  Cardiovascular: Normal rate, regular rhythm and normal heart sounds.   Pulmonary/Chest: Effort normal and breath sounds normal.  Neurological: He is alert and oriented to person, place, and time.  Skin: Skin is warm and dry.  Psychiatric: He has a normal mood and affect. His behavior is normal. Judgment and thought content normal.  overweight    Assessment & Plan:  HTN (hypertension) Add lisinopril 20 mg po qd/cont home BP and call if not controlled  Obesity, unspecified Discussed need to lose weight. Awaiting knee surgery  Hyperlipidemia Atorvastatin 20 mg po qd.   Follow up in 3 months.  I have completed the patient encounter in its entirety as documented by FNP Carlean Purl,  with editing by me where necessary. Advit Trethewey P. Laney [name], M.D.

## 2013-07-10 ENCOUNTER — Encounter: Payer: Self-pay | Admitting: Internal Medicine

## 2013-07-10 ENCOUNTER — Ambulatory Visit (INDEPENDENT_AMBULATORY_CARE_PROVIDER_SITE_OTHER): Payer: Managed Care, Other (non HMO) | Admitting: Internal Medicine

## 2013-07-10 VITALS — BP 135/79 | HR 84 | Temp 98.6°F | Resp 16 | Ht 66.0 in | Wt 220.0 lb

## 2013-07-10 DIAGNOSIS — I10 Essential (primary) hypertension: Secondary | ICD-10-CM

## 2013-07-10 DIAGNOSIS — K7581 Nonalcoholic steatohepatitis (NASH): Secondary | ICD-10-CM

## 2013-07-10 DIAGNOSIS — E785 Hyperlipidemia, unspecified: Secondary | ICD-10-CM

## 2013-07-10 DIAGNOSIS — Z6835 Body mass index (BMI) 35.0-35.9, adult: Secondary | ICD-10-CM

## 2013-07-10 DIAGNOSIS — R05 Cough: Secondary | ICD-10-CM

## 2013-07-10 DIAGNOSIS — R059 Cough, unspecified: Secondary | ICD-10-CM

## 2013-07-10 MED ORDER — AMLODIPINE BESYLATE 10 MG PO TABS: 10.0000 mg | ORAL_TABLET | Freq: Every day | ORAL | Status: DC

## 2013-07-10 MED ORDER — ATORVASTATIN CALCIUM 20 MG PO TABS: 20.0000 mg | ORAL_TABLET | Freq: Every day | ORAL | Status: DC

## 2013-07-10 MED ORDER — LOSARTAN POTASSIUM-HCTZ 100-12.5 MG PO TABS: 1.0000 | ORAL_TABLET | Freq: Every day | ORAL | Status: DC
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
## 2013-07-10 NOTE — Patient Instructions (Signed)
Check lipids in 3 mos Calibrate home bp cuff

## 2013-07-31 DEATH — deceased

## 2013-10-08 ENCOUNTER — Ambulatory Visit (INDEPENDENT_AMBULATORY_CARE_PROVIDER_SITE_OTHER): Payer: Managed Care, Other (non HMO) | Admitting: Family Medicine

## 2013-10-08 ENCOUNTER — Encounter: Payer: Self-pay | Admitting: Family Medicine

## 2013-10-08 VITALS — BP 142/78 | HR 84 | Resp 18 | Ht 66.0 in | Wt 227.0 lb

## 2013-10-08 DIAGNOSIS — K7689 Other specified diseases of liver: Secondary | ICD-10-CM

## 2013-10-08 DIAGNOSIS — I10 Essential (primary) hypertension: Secondary | ICD-10-CM

## 2013-10-08 DIAGNOSIS — Z6835 Body mass index (BMI) 35.0-35.9, adult: Secondary | ICD-10-CM

## 2013-10-08 DIAGNOSIS — E785 Hyperlipidemia, unspecified: Secondary | ICD-10-CM

## 2013-10-08 DIAGNOSIS — K7581 Nonalcoholic steatohepatitis (NASH): Secondary | ICD-10-CM

## 2013-10-08 LAB — LIPID PANEL
Cholesterol: 157 mg/dL (ref 0–200)
HDL: 59 mg/dL (ref >39)
LDL CALC: 53 mg/dL (ref 0–99)
Total CHOL/HDL Ratio: 2.7 Ratio
Triglycerides: 225 mg/dL — ABNORMAL HIGH (ref <150)
VLDL: 45 mg/dL — ABNORMAL HIGH (ref 0–40)

## 2013-10-08 LAB — COMPREHENSIVE METABOLIC PANEL
ALBUMIN: 4.6 g/dL (ref 3.5–5.2)
ALT: 70 U/L — AB (ref 0–53)
AST: 61 U/L — AB (ref 0–37)
Alkaline Phosphatase: 83 U/L (ref 39–117)
BUN: 14 mg/dL (ref 6–23)
CALCIUM: 9.3 mg/dL (ref 8.4–10.5)
CHLORIDE: 100 meq/L (ref 96–112)
CO2: 23 mEq/L (ref 19–32)
Creat: 1.04 mg/dL (ref 0.50–1.35)
Glucose, Bld: 125 mg/dL — ABNORMAL HIGH (ref 70–99)
POTASSIUM: 4.1 meq/L (ref 3.5–5.3)
SODIUM: 136 meq/L (ref 135–145)
TOTAL PROTEIN: 7.7 g/dL (ref 6.0–8.3)
Total Bilirubin: 0.5 mg/dL (ref 0.2–1.2)

## 2013-10-08 NOTE — Patient Instructions (Signed)
Decrease alcohol to 2 drinks per day Walk at least 10,000 steps a day Aim to lose 1 pound a week- decrease simple carbohydrates (bread, rice, pasta, desserts), increase healthy/lean proteins (lean meat, fish, nuts, peanut butter), increase fresh vegetables (aim for 3-5 servings a day) Purchase a new blood pressure cuff and check your blood pressure every week (after sitting quietly for at least 15 minutes) Notify us if your blood pressure is consistently greater than 160/100 (can send a my chart message) Follow up in 3 months for a complete physical exam

## 2013-10-08 NOTE — Progress Notes (Signed)
Subjective:    Patient ID: Marc Ponce, male    DOB: 01-12-1956, 58 y.o.   MRN: [id number]  HPI Patient presents today for follow up of HTN. He is currently taking norvasc 10 mg and Hyzaar 100-12.5. When he first started the losartin, he had some flushing and dizziness for about a week. Has occasional feeling of flushing. Has been checking blood pressures at home and getting readings of 180/110. His blood pressure cuff did not correspond closely to office reading today (his cuff 182/108 vs. 160/90)  Is currently seeing Dr. Adriana Mccallum for his left knee pain and is getting cortisone shots. These provide some temporary relief. He may need arthroscopic surgery in the not too distant future. After getting injections, he is able to increase his walking and has been using a pedometer application on his smart phone. He finds this motivating to walk more. He enjoys walking, but is not able to walk as much as he would like in the [place] heat and humidity.  Discussed his increased weight over the last 2 years. He reports it is difficult for him to watch what he eats due to his work schedule- he spends his weekdays in [place], [place] for his job which is very sedentary. He works long hours (10+), finds himself "zoning out in front of the TV and eating too much late at night." He eats out a fair amount and mostly eats prepared food during the week. He does have a kitchen when in [place], but finds it is difficult to control his portions.  He drinks 4-5 glasses of wine every night and admits that this probably makes it difficult to limit his food intake.  He reports that he sleeps poorly, getting about 6 hours a night when in [place] and 7-8 a night when he is back home in [place].   Review of Systems No CP, feels like stamina has decreased, no SOB, occasional swelling of feet with air travel.     Objective:   Physical Exam  Vitals reviewed. Constitutional: He is oriented to person, place, and time. He  appears well-developed and well-nourished. No distress.  Eyes: Conjunctivae are normal. Right eye exhibits no discharge. Left eye exhibits no discharge.  Neck: Normal range of motion. Neck supple.  Cardiovascular: Normal rate, regular rhythm and normal heart sounds.   Pulmonary/Chest: Effort normal and breath sounds normal.  Musculoskeletal: Normal range of motion.  Neurological: He is alert and oriented to person, place, and time.  Skin: Skin is warm and dry. He is not diaphoretic.  Psychiatric: He has a normal mood and affect. His behavior is normal. Judgment and thought content normal.   Recheck blood pressure 142/78     Assessment & Plan:  1. Unspecified essential hypertension - Comprehensive metabolic panel -Patient to purchase a new automatic BP cuff and take weekly readings after sitting still for 15 minutes  2. Other and unspecified hyperlipidemia - Lipid panel  3. Steatohepatitis - Comprehensive metabolic panel  4. BMI 35.0-35.9,adult - Lipid panel  -Patient with fair amount of insight and knowledge of necessary lifestyle modifications. He is lacking in motivation.  - Patient Instructions  Decrease alcohol to 2 drinks per day Walk at least 10,000 steps a day Aim to lose 1 pound a week- decrease simple carbohydrates (bread, rice, pasta, desserts), increase healthy/lean proteins (lean meat, fish, nuts, peanut butter), increase fresh vegetables (aim for 3-5 servings a day) Purchase a new blood pressure cuff and check your blood pressure every week (  after sitting quietly for at least 15 minutes) Notify us if your blood pressure is consistently greater than 160/100 (can send a my chart message) Follow up in 3 months for a complete physical exam    Elby Beck, FNP-BC  [facility]  10/08/2013 1:47 PM

## 2013-10-23 ENCOUNTER — Ambulatory Visit: Payer: Managed Care, Other (non HMO) | Admitting: Internal Medicine

## 2013-12-22 ENCOUNTER — Other Ambulatory Visit: Payer: Self-pay | Admitting: Internal Medicine

## 2013-12-23 ENCOUNTER — Encounter: Payer: Self-pay | Admitting: Internal Medicine

## 2014-02-04 ENCOUNTER — Encounter: Payer: Managed Care, Other (non HMO) | Admitting: Family Medicine

## 2014-04-10 HISTORY — PX: OTHER SURGICAL HISTORY: SHX169

## 2014-05-20 ENCOUNTER — Ambulatory Visit (INDEPENDENT_AMBULATORY_CARE_PROVIDER_SITE_OTHER): Payer: BLUE CROSS/BLUE SHIELD | Admitting: Family Medicine

## 2014-05-20 ENCOUNTER — Encounter: Payer: Self-pay | Admitting: Family Medicine

## 2014-05-20 VITALS — BP 150/80 | HR 68 | Temp 98.1°F | Resp 16 | Ht 66.5 in | Wt 220.0 lb

## 2014-05-20 DIAGNOSIS — I10 Essential (primary) hypertension: Secondary | ICD-10-CM

## 2014-05-20 DIAGNOSIS — M25562 Pain in left knee: Secondary | ICD-10-CM

## 2014-05-20 DIAGNOSIS — E782 Mixed hyperlipidemia: Secondary | ICD-10-CM

## 2014-05-20 DIAGNOSIS — E669 Obesity, unspecified: Secondary | ICD-10-CM

## 2014-05-20 DIAGNOSIS — R238 Other skin changes: Secondary | ICD-10-CM

## 2014-05-20 DIAGNOSIS — R233 Spontaneous ecchymoses: Secondary | ICD-10-CM

## 2014-05-20 LAB — CBC
HEMATOCRIT: 44.3 % (ref 39.0–52.0)
Hemoglobin: 14.9 g/dL (ref 13.0–17.0)
MCH: 32.4 pg (ref 26.0–34.0)
MCHC: 33.6 g/dL (ref 30.0–36.0)
MCV: 96.3 fL (ref 78.0–100.0)
MPV: 10 fL (ref 8.6–12.4)
Platelets: 188 10*3/uL (ref 150–400)
RBC: 4.6 MIL/uL (ref 4.22–5.81)
RDW: 14 % (ref 11.5–15.5)
WBC: 7.2 10*3/uL (ref 4.0–10.5)

## 2014-05-20 LAB — BASIC METABOLIC PANEL
BUN: 17 mg/dL (ref 6–23)
CALCIUM: 9.4 mg/dL (ref 8.4–10.5)
CO2: 25 mEq/L (ref 19–32)
CREATININE: 1.05 mg/dL (ref 0.50–1.35)
Chloride: 100 mEq/L (ref 96–112)
GLUCOSE: 125 mg/dL — AB (ref 70–99)
Potassium: 4.2 mEq/L (ref 3.5–5.3)
SODIUM: 137 meq/L (ref 135–145)

## 2014-05-20 MED ORDER — ATORVASTATIN CALCIUM 20 MG PO TABS: 20.0000 mg | ORAL_TABLET | Freq: Every day | ORAL | Status: DC

## 2014-05-20 MED ORDER — AMLODIPINE BESYLATE 10 MG PO TABS: 10.0000 mg | ORAL_TABLET | Freq: Every day | ORAL | Status: DC

## 2014-05-20 MED ORDER — LOSARTAN POTASSIUM-HCTZ 100-12.5 MG PO TABS: 1.0000 | ORAL_TABLET | Freq: Every day | ORAL | Status: DC

## 2014-05-20 NOTE — Progress Notes (Signed)
   Subjective:    Patient ID: Marc Ponce, male    DOB: 28-Oct-1955, 59 y.o.   MRN: [id number]  HPI Patient presents today for follow up of HTN. He needs a form completed for a discount with his insurance provider. He reports that his last 3 blood pressure readings were 130-140s/80s. Has lost 7 pounds and attributes this to not traveling for his job and eating healthier foods prepared by his wife.   Has not been exercising as much since he had minor knee surgery. Sees Dr. Alvan Dame and is currently going through an injection series; may need Marc left knee replacement. Is having PT. Doing better and strengthening his lateral quad. Still has some pain with stairs.   He has noticed that he seems to get small bruises on his arms more easily than in the past. He will bruise if he bumps his arms or scratches too hard. He denies excessive bleeding. His father had very fragile skin as he aged.   Review of Systems No chest pain, no SOB, no cough, +edema left knee     Objective:   Physical Exam  Constitutional: He is oriented to person, place, and time. He appears well-developed and well-nourished.  HENT:  Head: Normocephalic and atraumatic.  Eyes: Conjunctivae are normal.  Neck: Normal range of motion. Neck supple.  Cardiovascular: Normal rate, regular rhythm and normal heart sounds.   Pulmonary/Chest: Effort normal and breath sounds normal.  Musculoskeletal: Normal range of motion.  Neurological: He is alert and oriented to person, place, and time.  Skin: Skin is warm and dry. No rash noted. No erythema.  Psychiatric: He has a normal mood and affect. His behavior is normal. Judgment and thought content normal.  Vitals reviewed.  BP 150/80 mmHg  Pulse 68  Temp(Src) 98.1 F (36.7 C) (Oral)  Resp 16  Ht 5' 6.5" (1.689 m)  Wt 220 lb (99.791 kg)  BMI 34.98 kg/m2  SpO2 97% Wt Readings from Last 3 Encounters:  05/20/14 220 lb (99.791 kg)  10/08/13 227 lb (102.967 kg)  07/10/13 220 lb  (99.791 kg)      Assessment & Plan:  1. Essential hypertension - Basic metabolic panel - losartan-hydrochlorothiazide (HYZAAR) 100-12.5 MG per tablet; Take 1 tablet by mouth daily.  Dispense: 90 tablet; Refill: 2 - amLODipine (NORVASC) 10 MG tablet; Take 1 tablet (10 mg total) by mouth daily.  Dispense: 90 tablet; Refill: 2  2. Knee pain, left - continue PT and ortho care  3. Easy bruising - CBC  4. Mixed hyperlipidemia - atorvastatin (LIPITOR) 20 MG tablet; Take 1 tablet (20 mg total) by mouth daily.  Dispense: 90 tablet; Refill: 2  Follow up in 6 months, will check fasting labs  Elby Beck, FNP-BC  [facility]  05/23/2014 1:37 PM

## 2014-11-25 ENCOUNTER — Ambulatory Visit (INDEPENDENT_AMBULATORY_CARE_PROVIDER_SITE_OTHER): Payer: BLUE CROSS/BLUE SHIELD | Admitting: Family Medicine

## 2014-11-25 ENCOUNTER — Encounter: Payer: Self-pay | Admitting: Family Medicine

## 2014-11-25 VITALS — BP 128/74 | HR 83 | Temp 98.8°F | Resp 16 | Ht 65.5 in | Wt 229.0 lb

## 2014-11-25 DIAGNOSIS — E785 Hyperlipidemia, unspecified: Secondary | ICD-10-CM

## 2014-11-25 DIAGNOSIS — I10 Essential (primary) hypertension: Secondary | ICD-10-CM

## 2014-11-25 DIAGNOSIS — F1010 Alcohol abuse, uncomplicated: Secondary | ICD-10-CM | POA: Diagnosis not present

## 2014-11-25 DIAGNOSIS — Z6837 Body mass index (BMI) 37.0-37.9, adult: Secondary | ICD-10-CM | POA: Diagnosis not present

## 2014-11-25 DIAGNOSIS — M25562 Pain in left knee: Secondary | ICD-10-CM

## 2014-11-25 LAB — CBC
HCT: 42 % (ref 39.0–52.0)
Hemoglobin: 14.5 g/dL (ref 13.0–17.0)
MCH: 32.9 pg (ref 26.0–34.0)
MCHC: 34.5 g/dL (ref 30.0–36.0)
MCV: 95.2 fL (ref 78.0–100.0)
MPV: 10.5 fL (ref 8.6–12.4)
PLATELETS: 171 10*3/uL (ref 150–400)
RBC: 4.41 MIL/uL (ref 4.22–5.81)
RDW: 14.2 % (ref 11.5–15.5)
WBC: 7.5 10*3/uL (ref 4.0–10.5)

## 2014-11-25 LAB — LIPID PANEL
CHOLESTEROL: 122 mg/dL — AB (ref 125–200)
HDL: 48 mg/dL (ref >=40)
LDL CALC: 51 mg/dL (ref <130)
TRIGLYCERIDES: 115 mg/dL (ref <150)
Total CHOL/HDL Ratio: 2.5 Ratio (ref <=5.0)
VLDL: 23 mg/dL (ref <30)

## 2014-11-25 LAB — COMPREHENSIVE METABOLIC PANEL
ALBUMIN: 4.3 g/dL (ref 3.6–5.1)
ALK PHOS: 82 U/L (ref 40–115)
ALT: 60 U/L — AB (ref 9–46)
AST: 65 U/L — AB (ref 10–35)
BILIRUBIN TOTAL: 0.6 mg/dL (ref 0.2–1.2)
BUN: 13 mg/dL (ref 7–25)
CALCIUM: 9.4 mg/dL (ref 8.6–10.3)
CHLORIDE: 102 meq/L (ref 98–110)
CO2: 25 mEq/L (ref 20–31)
Creat: 1.04 mg/dL (ref 0.70–1.33)
Glucose, Bld: 129 mg/dL — ABNORMAL HIGH (ref 65–99)
POTASSIUM: 4.1 meq/L (ref 3.5–5.3)
Sodium: 138 mEq/L (ref 135–146)
TOTAL PROTEIN: 7.3 g/dL (ref 6.1–8.1)

## 2014-11-25 LAB — HEMOGLOBIN A1C
Hgb A1c MFr Bld: 6 % — ABNORMAL HIGH (ref <5.7)
MEAN PLASMA GLUCOSE: 126 mg/dL — AB (ref <117)

## 2014-11-25 NOTE — Progress Notes (Signed)
Subjective:    Patient ID: Marc Ponce, male    DOB: [birth year], 59 y.o.   MRN: [id number]  HPI Patient presents today for follow up of HTN, left knee pain and obesity.  He has been experiencing left knee pain for quite some time. He had a series of Euflexa shots in his knee by Dr. Alvan Dame. Had some initial improvement, then had a problem with his meniscus. He then had a cortisone shot 2 months ago with little improvement. Was told that he could have two Ponce cortisone shots within the year and that he should wait until he can not tolerate the pain any Ponce before having his knee replaced. He is having pain frequently and it often interrupts his sleep. He takes diclofenac sparingly- about 2 a month with some improvement in pain. Has noticed Ponce instability. Feels Ponce deconditioned with his decreased activity. Can do ok walking on flat surfaces, but struggles with inclines. Was recently on vacation and did some hiking that he found Ponce difficult than he expected. He is considering going to [place] for a second opinion on his TKR as he can go there through his insurance carrier with little out of pocket expense.   Has been Ponce stressed with his work lately as it is getting Ponce and Ponce difficult to get to [place] where he works. Is considering a job with less strenuous travel, but he doesn't like change. He has regained the weight he had lost, reports that he is still watching salt content and reading labels, he not been making consistently healthy choices. He continues to drink 3-4 glasses of wine every night.   Past Medical History  Diagnosis Date  . Hypertension   . Diverticulitis   . GERD (gastroesophageal reflux disease)   . Rectal pain   . Peri-rectal abscess    Past Surgical History  Procedure Laterality Date  . Shoulder surgery  01/12/2010    Left   . Plate insertion  9528, 2011    humerus 1977, femer 2011  . No past surgeries    . Orthoscopic surgery on left knee  04/10/2014    Family History  Problem Relation Age of Onset  . Heart disease Father   . Hypertension Father    History  Substance Use Topics  . Smoking status: Former Smoker    Quit date: 04/01/1986  . Smokeless tobacco: Never Used  . Alcohol Use: Yes     Comment: 3 per day    Review of Systems No chest pain, had SOB x 1 with strenuous uphill hike, some lower leg swelling. No falls.     Objective:   Physical Exam Physical Exam  Constitutional: Oriented to person, place, and time. He appears well-developed and well-nourished. Obese. HENT:  Head: Normocephalic and atraumatic.  Eyes: Conjunctivae are normal.  Neck: Normal range of motion. Neck supple.  Cardiovascular: Normal rate, regular rhythm and normal heart sounds.   Pulmonary/Chest: Effort normal and breath sounds normal.  Musculoskeletal: Normal range of motion. Trace pretibial edema. Neurological: Alert and oriented to person, place, and time.  Skin: Skin is warm and dry.  Psychiatric: Normal mood and affect. Behavior is normal. Judgment and thought content normal.  Vitals reviewed. BP 155/81 mmHg  Pulse 83  Temp(Src) 98.8 F (37.1 C)  Resp 16  Ht 5' 5.5" (1.664 m)  Wt 229 lb (103.874 kg)  BMI 37.51 kg/m2 Wt Readings from Last 3 Encounters:  11/25/14 229 lb (103.874 kg)  05/20/14 220 lb (99.791  kg)  10/08/13 227 lb (102.967 kg)  Repeat BP- 128/74    Assessment & Plan:  1. Essential hypertension - CBC - Comprehensive metabolic panel - Lipid panel - Hemoglobin A1c  2. Alcohol abuse, daily use - Encouraged him to decrease use to improve sleep, decrease calorie consumption and promote healing if he has surgery.  3. BMI 37.0-37.9, adult - Comprehensive metabolic panel - Lipid panel - Hemoglobin A1c - discussed barriers to making good food choices and time for regular exercise.  - Encouraged weight loss especially if he is considering surgery this year  4. Hyperlipidemia - Lipid panel - Hemoglobin A1c  5. Knee  pain, left - Discussed his quality of life and how he can approach decision regarding surgery.    Clarene Reamer, FNP-BC  [facility]  11/25/2014 9:50 PM

## 2015-02-23 ENCOUNTER — Other Ambulatory Visit: Payer: Self-pay | Admitting: Family Medicine

## 2015-03-16 ENCOUNTER — Other Ambulatory Visit: Payer: Self-pay | Admitting: Family Medicine

## 2015-03-30 ENCOUNTER — Encounter: Payer: Self-pay | Admitting: Internal Medicine

## 2015-05-08 ENCOUNTER — Other Ambulatory Visit: Payer: Self-pay | Admitting: Family Medicine

## 2015-05-25 ENCOUNTER — Other Ambulatory Visit: Payer: Self-pay | Admitting: Family Medicine

## 2015-06-01 ENCOUNTER — Ambulatory Visit: Payer: BLUE CROSS/BLUE SHIELD | Admitting: Family Medicine

## 2015-06-27 ENCOUNTER — Ambulatory Visit (INDEPENDENT_AMBULATORY_CARE_PROVIDER_SITE_OTHER): Payer: BLUE CROSS/BLUE SHIELD | Admitting: Internal Medicine

## 2015-06-27 VITALS — BP 138/80 | HR 75 | Temp 97.9°F | Resp 16 | Ht 67.0 in | Wt 228.0 lb

## 2015-06-27 DIAGNOSIS — K7581 Nonalcoholic steatohepatitis (NASH): Secondary | ICD-10-CM | POA: Diagnosis not present

## 2015-06-27 DIAGNOSIS — E785 Hyperlipidemia, unspecified: Secondary | ICD-10-CM

## 2015-06-27 DIAGNOSIS — I10 Essential (primary) hypertension: Secondary | ICD-10-CM

## 2015-06-27 DIAGNOSIS — Z0000 Encounter for general adult medical examination without abnormal findings: Secondary | ICD-10-CM

## 2015-06-27 DIAGNOSIS — Z6835 Body mass index (BMI) 35.0-35.9, adult: Secondary | ICD-10-CM

## 2015-06-27 DIAGNOSIS — Z23 Encounter for immunization: Secondary | ICD-10-CM | POA: Diagnosis not present

## 2015-06-27 LAB — CBC WITH DIFFERENTIAL/PLATELET
BASOS ABS: 0.1 10*3/uL (ref 0.0–0.1)
Basophils Relative: 1 % (ref 0–1)
EOS PCT: 3 % (ref 0–5)
Eosinophils Absolute: 0.2 10*3/uL (ref 0.0–0.7)
HEMATOCRIT: 43.2 % (ref 39.0–52.0)
HEMOGLOBIN: 14.7 g/dL (ref 13.0–17.0)
LYMPHS ABS: 1.9 10*3/uL (ref 0.7–4.0)
LYMPHS PCT: 24 % (ref 12–46)
MCH: 32.7 pg (ref 26.0–34.0)
MCHC: 34 g/dL (ref 30.0–36.0)
MCV: 96 fL (ref 78.0–100.0)
MPV: 10.1 fL (ref 8.6–12.4)
Monocytes Absolute: 0.8 10*3/uL (ref 0.1–1.0)
Monocytes Relative: 10 % (ref 3–12)
NEUTROS ABS: 4.9 10*3/uL (ref 1.7–7.7)
Neutrophils Relative %: 62 % (ref 43–77)
Platelets: 174 10*3/uL (ref 150–400)
RBC: 4.5 MIL/uL (ref 4.22–5.81)
RDW: 13.8 % (ref 11.5–15.5)
WBC: 7.9 10*3/uL (ref 4.0–10.5)

## 2015-06-27 LAB — POCT GLYCOSYLATED HEMOGLOBIN (HGB A1C): Hemoglobin A1C: 5.9

## 2015-06-27 LAB — HEPATITIS C ANTIBODY: HCV Ab: NEGATIVE

## 2015-06-27 MED ORDER — VERAPAMIL HCL ER 180 MG PO TBCR: 180.0000 mg | EXTENDED_RELEASE_TABLET | Freq: Every day | ORAL | Status: DC

## 2015-06-27 MED ORDER — LOSARTAN POTASSIUM-HCTZ 100-12.5 MG PO TABS: 1.0000 | ORAL_TABLET | Freq: Every day | ORAL | Status: DC

## 2015-06-27 NOTE — Progress Notes (Signed)
Subjective:  By signing my name below, I, Marc Ponce, attest that this documentation has been prepared under the direction and in the presence of Marc Lin, MD.  Electronically Signed: Thea Alken, ED Scribe. 06/27/2015. 11:38 AM.  Patient ID: Marc Ponce, male    DOB: 12-24-1955, 60 y.o.   MRN: [id number]  HPI   Chief Complaint  Patient presents with  . Annual Exam  . Medication Refill    Norvasc, atorvastatin, losartan  . Flu Vaccine   HPI Comments: Marc Ponce is a 60 y.o. male who presents to the [facility] for [facility] physical.   He does not exercise much. Pt works for [employer] which consist of a lot of travelling and results in a poor diet.  He states he has lost 7lbs.   Pt has scheduled an appointment with Gastroenterologist and plans to make an appointment for colonoscopy.    Pt has chronic left knee pain. He has hx of arthritis. He is followed by Dr. Alvan Dame.  Pt feels knee pain has caused him to lose stamina.He currently takes diclofenac twice a day and feels this medication is effective. He has tried [employer] series but did not like this treatment and believes it contributing to leg swelling. Pt states he had an endoscope in 2015 with improvement to knee pain and gait.   Pt states he has been sleeping well. Pt also complains of leg swelling that began 2 years ago after knee endoscope. Initially he found improvement of leg swelling with elevating leg as well as exercise. As of recently he has noticed more of a sock imprint at the end of his dead. He thinks BP medication may be contributing to leg swelling.    Pt states his wife complains about him snoring and has mentioned that he has stopped breathing while sleeping. He was referred for a sleep study in 2014 but pt neve went.   Pt has noticed a discoloration to medial aspect lower leg. States area occasionally itches.   He has seen a cardiologist several years ago--stres tes neg. Pt  reports famil hx of heart problems. He states his father had his first MI in early 20's.  Patient Active Problem List   Diagnosis Date Noted  . BMI 35.0-35.9,adult 07/10/2013  . HTN (hypertension) 03/12/2013  . Hyperlipidemia 03/12/2013  . Anal or rectal pain 05/24/2011  . Steatohepatitis 04/19/2011  . Alcohol abuse, daily use 04/19/2011  . Diverticulitis 04/16/2011    Past Medical History  Diagnosis Date  . Hypertension   . Diverticulitis   . GERD (gastroesophageal reflux disease)   . Rectal pain   . Peri-rectal abscess    Past Surgical History  Procedure Laterality Date  . Shoulder surgery  01/12/2010    Left   . Plate insertion  8315, 2011    humerus 1977, femer 2011  . No past surgeries    . Orthoscopic surgery on left knee  04/10/2014   Prior to Admission medications   Medication Sig Start Date End Date Taking? Authorizing Provider  amLODipine (NORVASC) 10 MG tablet TAKE 1 TABLET (10 MG TOTAL) BY MOUTH DAILY 05/26/15  Yes [name], MD  atorvastatin (LIPITOR) 20 MG tablet TAKE 1 TABLET (20 MG TOTAL) BY MOUTH DAILY. 05/08/15  Yes Elby Beck, FNP  diclofenac (VOLTAREN) 75 MG EC tablet Take 75 mg by mouth 2 (two) times daily.   Yes Historical Provider, MD  lansoprazole (PREVACID) 30 MG capsule Take 30  mg by mouth daily at 12 noon.   Yes Historical Provider, MD  losartan-hydrochlorothiazide (HYZAAR) 100-12.5 MG tablet TAKE 1 TABLET BY MOUTH DAILY. 03/16/15  Yes Elby Beck, FNP   Review of Systems  Cardiovascular: Positive for leg swelling.  Musculoskeletal: Positive for arthralgias.   Objective:   Physical Exam  Constitutional: He is oriented to person, place, and time. He appears well-developed and well-nourished. No distress.  HENT:  Head: Normocephalic and atraumatic.  Right Ear: Hearing, tympanic membrane, external ear and ear canal normal.  Left Ear: Hearing, tympanic membrane, external ear and ear canal normal.  Nose: Nose normal.    Mouth/Throat: Uvula is midline, oropharynx is clear and moist and mucous membranes are normal. No oropharyngeal exudate.  Eyes: Conjunctivae, EOM and lids are normal. Pupils are equal, round, and reactive to light. Right eye exhibits no discharge. Left eye exhibits no discharge. No scleral icterus.  Neck: Trachea normal and normal range of motion. Neck supple. Carotid bruit is not present.  Cardiovascular: Normal rate, regular rhythm, normal heart sounds, intact distal pulses and normal pulses.   No murmur heard. Pulmonary/Chest: Effort normal and breath sounds normal. No respiratory distress. He has no wheezes. He has no rhonchi. He has no rales.  Abdominal: Soft. Normal appearance and bowel sounds are normal. He exhibits no abdominal bruit. There is no tenderness.  Musculoskeletal: Normal range of motion. He exhibits edema. He exhibits no tenderness.  1-2+pitting LE L>R Good per pul L leg with atropy of ant thigh and post thi weakness to resis movements  Lymphadenopathy:       Head (right side): No submental, no submandibular, no tonsillar, no preauricular, no posterior auricular and no occipital adenopathy present.       Head (left side): No submental, no submandibular, no tonsillar, no preauricular, no posterior auricular and no occipital adenopathy present.    He has no cervical adenopathy.  Neurological: He is alert and oriented to person, place, and time. He has normal strength and normal reflexes. No cranial nerve deficit or sensory deficit. Coordination and gait normal.  Skin: Skin is warm, dry and intact. No lesion and no rash noted.  Psychiatric: He has a normal mood and affect. His speech is normal and behavior is normal. Judgment and thought content normal.  Nursing note and vitals reviewed.  Filed Vitals:   06/27/15 1040  BP: 138/80  Pulse: 75  Temp: 97.9 F (36.6 C)  TempSrc: Oral  Resp: 16  Height: 5' 7"  (1.702 m)  Weight: 228 lb (103.42 kg)  SpO2: 98%    Results for  orders placed or performed in visit on 06/27/15  POCT glycosylated hemoglobin (Hb A1C)  Result Value Ref Range   Hemoglobin A1C 5.9    Assessment & Plan:  I have completed the patient encounter in its entirety as documented by the scribe, with editing by me where necessary. Casidy Alberta P. Laney [name], M.D. Steatohepatitis w/ hx dysfunc GB by scan  Essential hypertension - Plan: CBC with Differential/Platelet, Comprehensive metabolic panel  Hyperlipidemia - Plan: Lipid panel  BMI 35.0-35.9,adult - Plan: POCT glycosylated hemoglobin (Hb A1C) -init goal <200  Annual physical exam - Plan: PSA, Flu Vaccine QUAD 36+ mos IM, Hepatitis C antibody  Left leg weakness with knee pain constant--he has orthopedic follow-up -He needs strengthening of the quads and biceps fem---exer given  Meds ordered this encounter  Medications  . verapamil (CALAN-SR) 180 MG CR tablet    Sig: Take 1 tablet (180 mg total) by  mouth at bedtime.    Dispense:  90 tablet    Refill:  1  . losartan-hydrochlorothiazide (HYZAAR) 100-12.5 MG tablet    Sig: Take 1 tablet by mouth daily.    Dispense:  90 tablet    Refill:  1   lipit rx after labs incr phys act w/ wt loss Repeat stress test if any DOE or Fatigue Wife to watch for sleep apnea D/c amlodipine and start verap to see effect on edema Diet too high in salt Patient Instructions  Tumeric or cherry extract for joints i'll order lipitor after i see cholesterol profile Consider compression stocking to travel Use potassium salt instead of sodium chloride(In grocery stores) Have wife check for not breathing(apnea) at night Leg exercises for muscles  home bp if possible and f/u  A1C <6.0!!!!

## 2015-06-27 NOTE — Patient Instructions (Addendum)
Tumeric or cherry extract for joints i'll order lipitor after i see cholesterol profile Consider compression stocking to travel Use potassium salt instead of sodium chloride(In grocery stores) Have wife check for not breathing(apnea) at night Leg exercises for muscles

## 2015-06-28 LAB — COMPREHENSIVE METABOLIC PANEL
ALBUMIN: 4.4 g/dL (ref 3.6–5.1)
ALK PHOS: 94 U/L (ref 40–115)
ALT: 87 U/L — AB (ref 9–46)
AST: 85 U/L — AB (ref 10–35)
BILIRUBIN TOTAL: 1.1 mg/dL (ref 0.2–1.2)
BUN: 17 mg/dL (ref 7–25)
CALCIUM: 9.4 mg/dL (ref 8.6–10.3)
CO2: 26 mmol/L (ref 20–31)
Chloride: 100 mmol/L (ref 98–110)
Creat: 1.13 mg/dL (ref 0.70–1.33)
Glucose, Bld: 119 mg/dL — ABNORMAL HIGH (ref 65–99)
POTASSIUM: 4.1 mmol/L (ref 3.5–5.3)
Sodium: 138 mmol/L (ref 135–146)
TOTAL PROTEIN: 7.4 g/dL (ref 6.1–8.1)

## 2015-06-28 LAB — LIPID PANEL
CHOL/HDL RATIO: 2.4 ratio (ref <=5.0)
CHOLESTEROL: 151 mg/dL (ref 125–200)
HDL: 62 mg/dL (ref >=40)
LDL Cholesterol: 51 mg/dL (ref <130)
TRIGLYCERIDES: 189 mg/dL — AB (ref <150)
VLDL: 38 mg/dL — ABNORMAL HIGH (ref <30)

## 2015-06-29 LAB — PSA: PSA: 0.22 ng/mL (ref <=4.00)

## 2015-08-09 ENCOUNTER — Other Ambulatory Visit: Payer: Self-pay | Admitting: Family Medicine

## 2015-08-10 ENCOUNTER — Other Ambulatory Visit: Payer: Self-pay

## 2015-08-10 MED ORDER — ATORVASTATIN CALCIUM 20 MG PO TABS: ORAL_TABLET | ORAL | Status: DC

## 2015-09-04 DIAGNOSIS — M1712 Unilateral primary osteoarthritis, left knee: Secondary | ICD-10-CM | POA: Diagnosis not present

## 2015-09-04 DIAGNOSIS — M25562 Pain in left knee: Secondary | ICD-10-CM | POA: Diagnosis not present

## 2015-11-02 ENCOUNTER — Other Ambulatory Visit: Payer: Self-pay | Admitting: Internal Medicine

## 2015-12-11 DIAGNOSIS — M25562 Pain in left knee: Secondary | ICD-10-CM | POA: Diagnosis not present

## 2015-12-11 DIAGNOSIS — M1712 Unilateral primary osteoarthritis, left knee: Secondary | ICD-10-CM | POA: Diagnosis not present

## 2015-12-21 ENCOUNTER — Telehealth: Payer: Self-pay

## 2015-12-21 NOTE — Telephone Encounter (Signed)
Pt is checking on status of refill request -he states he needs it today he is leaving out of town at 4 am  Best number [phone]

## 2015-12-22 MED ORDER — VERAPAMIL HCL ER 180 MG PO TBCR: 180.0000 mg | EXTENDED_RELEASE_TABLET | Freq: Every day | ORAL | 0 refills | Status: DC

## 2015-12-22 NOTE — Telephone Encounter (Signed)
Tried to call pt to find out what exactly was the prescription was that he needs refilled

## 2015-12-22 NOTE — Telephone Encounter (Signed)
Called pt and sent to local [facility] as req'd so that it can be transferred. Discussed w/ pt need for f/up and est care with new PCP. Pt agreed he will call and sch appt.

## 2015-12-22 NOTE — Telephone Encounter (Signed)
Verapamil is the medication the pt is checking the status on

## 2016-01-08 ENCOUNTER — Ambulatory Visit: Payer: BLUE CROSS/BLUE SHIELD

## 2016-01-09 ENCOUNTER — Ambulatory Visit (INDEPENDENT_AMBULATORY_CARE_PROVIDER_SITE_OTHER): Payer: BLUE CROSS/BLUE SHIELD | Admitting: Physician Assistant

## 2016-01-09 ENCOUNTER — Encounter: Payer: Self-pay | Admitting: Physician Assistant

## 2016-01-09 VITALS — BP 128/82 | HR 77 | Temp 98.2°F | Resp 18 | Ht 67.0 in | Wt 226.6 lb

## 2016-01-09 DIAGNOSIS — J40 Bronchitis, not specified as acute or chronic: Secondary | ICD-10-CM

## 2016-01-09 DIAGNOSIS — Z23 Encounter for immunization: Secondary | ICD-10-CM

## 2016-01-09 DIAGNOSIS — I10 Essential (primary) hypertension: Secondary | ICD-10-CM | POA: Diagnosis not present

## 2016-01-09 DIAGNOSIS — E785 Hyperlipidemia, unspecified: Secondary | ICD-10-CM | POA: Diagnosis not present

## 2016-01-09 LAB — CBC
HCT: 37.8 % — ABNORMAL LOW (ref 38.5–50.0)
Hemoglobin: 12.6 g/dL — ABNORMAL LOW (ref 13.2–17.1)
MCH: 33.2 pg — AB (ref 27.0–33.0)
MCHC: 33.3 g/dL (ref 32.0–36.0)
MCV: 99.7 fL (ref 80.0–100.0)
MPV: 9.9 fL (ref 7.5–12.5)
Platelets: 141 10*3/uL (ref 140–400)
RBC: 3.79 MIL/uL — ABNORMAL LOW (ref 4.20–5.80)
RDW: 13.8 % (ref 11.0–15.0)
WBC: 7.1 10*3/uL (ref 3.8–10.8)

## 2016-01-09 LAB — LIPID PANEL
CHOLESTEROL: 151 mg/dL (ref 125–200)
HDL: 52 mg/dL (ref >=40)
LDL CALC: 69 mg/dL (ref <130)
TRIGLYCERIDES: 150 mg/dL — AB (ref <150)
Total CHOL/HDL Ratio: 2.9 Ratio (ref <=5.0)
VLDL: 30 mg/dL (ref <30)

## 2016-01-09 LAB — BASIC METABOLIC PANEL
BUN: 25 mg/dL (ref 7–25)
CO2: 25 mmol/L (ref 20–31)
Calcium: 9.6 mg/dL (ref 8.6–10.3)
Chloride: 108 mmol/L (ref 98–110)
Creat: 1.53 mg/dL — ABNORMAL HIGH (ref 0.70–1.33)
GLUCOSE: 113 mg/dL — AB (ref 65–99)
POTASSIUM: 4.7 mmol/L (ref 3.5–5.3)
Sodium: 140 mmol/L (ref 135–146)

## 2016-01-09 LAB — TSH: TSH: 3.41 mIU/L (ref 0.40–4.50)

## 2016-01-09 MED ORDER — LOSARTAN POTASSIUM-HCTZ 100-12.5 MG PO TABS: 1.0000 | ORAL_TABLET | Freq: Every day | ORAL | 2 refills | Status: DC

## 2016-01-09 MED ORDER — METHYLPREDNISOLONE ACETATE 40 MG/ML IJ SUSP
80.0000 mg | Freq: Once | INTRAMUSCULAR | Status: AC
  Administered 2016-01-09: 80 mg via INTRAMUSCULAR

## 2016-01-09 MED ORDER — HYDROCODONE-HOMATROPINE 5-1.5 MG/5ML PO SYRP: 5.0000 mL | ORAL_SOLUTION | Freq: Three times a day (TID) | ORAL | 0 refills | Status: DC | PRN

## 2016-01-09 MED ORDER — VERAPAMIL HCL ER 180 MG PO TBCR: 180.0000 mg | EXTENDED_RELEASE_TABLET | Freq: Every day | ORAL | 2 refills | Status: DC

## 2016-01-09 MED ORDER — ATORVASTATIN CALCIUM 20 MG PO TABS: 20.0000 mg | ORAL_TABLET | Freq: Every day | ORAL | 2 refills | Status: DC

## 2016-01-09 NOTE — Progress Notes (Signed)
01/09/2016 10:52 AM   DOB: Dec 16, 1955 / MRN: [id number]  SUBJECTIVE:  Marc Ponce is a 60 y.o. male presenting for medication refills.  He is seen by Dr. Laney Ponce normally.  Take 2 medication for BP and a statin.  He is compliant with his medication.  Labs normal aside from a mildly elevated triglyceride at last check.  He is amenable to labs today. He denies HA, SOB, new DOE, leg and belly swelling, vision changes, chest pain.    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  Was doing 13 hour days.  Complains of persistent dry cough since this started.  His wife is at home with a "chest cold."   He has No Known Allergies.   He  has a past medical history of Diverticulitis; GERD (gastroesophageal reflux disease); Hypertension; Peri-rectal abscess; and Rectal pain.    He  reports that he quit smoking about 29 years ago. He has never used smokeless tobacco. He reports that he drinks alcohol. He reports that he does not use drugs. He  has no sexual activity history on file. The patient  has a past surgical history that includes Shoulder surgery (01/12/2010); plate insertion (0881, 2011); No past surgeries; and Orthoscopic surgery on Left Knee (04/10/2014).  His family history includes Heart disease in his father; Hypertension in his father.  Review of Systems  Constitutional: Negative for chills and fever.  Respiratory: Positive for cough and sputum production. Negative for hemoptysis, shortness of breath and wheezing.   Cardiovascular: Negative for chest pain.  Gastrointestinal: Negative for nausea.  Musculoskeletal: Negative for myalgias.  Skin: Negative for itching and rash.  Neurological: Negative for dizziness and headaches.    The problem list and medications were reviewed and updated by myself where necessary and exist elsewhere in the encounter.   OBJECTIVE:  BP 128/82   Pulse 77   Temp 98.2 F (36.8 C) (Oral)   Resp  18   Ht 5' 7"  (1.702 m)   Wt 226 lb 9.6 oz (102.8 kg)   SpO2 96%   BMI 35.49 kg/m   Physical Exam  Constitutional: He is oriented to person, place, and time. He appears well-developed. He does not appear ill.  Eyes: Conjunctivae and EOM are normal. Pupils are equal, round, and reactive to light.  Cardiovascular: Normal rate, regular rhythm and normal heart sounds.   Pulmonary/Chest: Effort normal and breath sounds normal. No respiratory distress. He has no wheezes. He has no rales. He exhibits no tenderness.  Abdominal: He exhibits no distension.  Musculoskeletal: Normal range of motion.       Right lower leg: He exhibits no swelling.       Left lower leg: He exhibits no swelling.  Neurological: He is alert and oriented to person, place, and time. No cranial nerve deficit. Coordination normal.  Skin: Skin is warm and dry. He is not diaphoretic.  Psychiatric: He has a normal mood and affect.  Nursing note and vitals reviewed.   Lab Results  Component Value Date   NA 138 06/27/2015   K 4.1 06/27/2015   CL 100 06/27/2015   CO2 26 06/27/2015   Lab Results  Component Value Date   CREATININE 1.13 06/27/2015   Lab Results  Component Value Date   HGBA1C 5.9 06/27/2015   Lab Results  Component Value Date   WBC 7.9 06/27/2015   HGB 14.7 06/27/2015   HCT 43.2 06/27/2015   MCV  96.0 06/27/2015   PLT 174 06/27/2015   Lab Results  Component Value Date   TSH 1.831 03/11/2013   Lab Results  Component Value Date   CHOL 151 06/27/2015   HDL 62 06/27/2015   LDLCALC 51 06/27/2015   TRIG 189 (H) 06/27/2015   CHOLHDL 2.4 06/27/2015        No results found for this or any previous visit (from the past 72 hour(s)).  No results found.  ASSESSMENT AND PLAN  Marc Ponce was seen today for medication refill.  Diagnoses and all orders for this visit:  Essential hypertension: Well controlled.  Continue current plan.  -     verapamil (CALAN-SR) 180 MG CR tablet; Take 1 tablet (180  mg total) by mouth at bedtime. -     losartan-hydrochlorothiazide (HYZAAR) 100-12.5 MG tablet; Take 1 tablet by mouth daily. -     CBC -     Basic metabolic panel -     TSH  Hyperlipidemia: Well contrlled continue current plan.  -     atorvastatin (LIPITOR) 20 MG tablet; Take 1 tablet (20 mg total) by mouth daily. -     Lipid panel  Bronchitis: I don't think this is bacterial in nature and his lungs sound normal.  Advised if he is not better in the next week then call and we can try azthromycin.  -     methylPREDNISolone acetate (DEPO-MEDROL) injection 80 mg; Inject 2 mLs (80 mg total) into the muscle once. -     HYDROcodone-homatropine (HYCODAN) 5-1.5 MG/5ML syrup; Take 5 mLs by mouth every 8 (eight) hours as needed for cough.    The patient is advised to call or return to clinic if he does not see an improvement in symptoms, or to seek the care of the closest emergency department if he worsens with the above plan.   Marc Ponce, MHS, PA-C [facility] 01/09/2016 10:52 AM

## 2016-01-09 NOTE — Patient Instructions (Signed)
     IF you received an x-ray today, you will receive an invoice from [facility]. Please contact [facility] at [phone] with questions or concerns regarding your invoice.   IF you received labwork today, you will receive an invoice from [facility]. Please contact [facility] at [phone] with questions or concerns regarding your invoice.   Our billing staff will not be able to assist you with questions regarding bills from these companies.  You will be contacted with the lab results as soon as they are available. The fastest way to get your results is to activate your My Chart account. Instructions are located on the last page of this paperwork. If you have not heard from us regarding the results in 2 weeks, please contact this office.      

## 2016-01-11 NOTE — Progress Notes (Signed)
We need to recheck his creatinine only in about 2 weeks.  Please call and place future order.  He needs to hydrate over the next two weeks. All of his other labs look good.

## 2016-01-12 ENCOUNTER — Other Ambulatory Visit: Payer: Self-pay | Admitting: *Deleted

## 2016-01-12 DIAGNOSIS — R7989 Other specified abnormal findings of blood chemistry: Secondary | ICD-10-CM

## 2016-01-13 ENCOUNTER — Telehealth: Payer: Self-pay | Admitting: Emergency Medicine

## 2016-01-13 DIAGNOSIS — R748 Abnormal levels of other serum enzymes: Secondary | ICD-10-CM

## 2016-01-13 NOTE — Telephone Encounter (Signed)
-----   Message from Tereasa Coop, PA-C sent at 01/11/2016  4:35 PM EDT ----- We need to recheck his creatinine only in about 2 weeks.  Please call and place future order.  He needs to hydrate over the next two weeks. All of his other labs look good.

## 2016-01-22 ENCOUNTER — Encounter: Payer: Self-pay | Admitting: *Deleted

## 2016-04-18 DIAGNOSIS — M1712 Unilateral primary osteoarthritis, left knee: Secondary | ICD-10-CM | POA: Diagnosis not present

## 2016-04-18 DIAGNOSIS — M25562 Pain in left knee: Secondary | ICD-10-CM | POA: Diagnosis not present

## 2016-04-28 DIAGNOSIS — M1712 Unilateral primary osteoarthritis, left knee: Secondary | ICD-10-CM | POA: Diagnosis not present

## 2016-05-13 DIAGNOSIS — M2392 Unspecified internal derangement of left knee: Secondary | ICD-10-CM | POA: Diagnosis not present

## 2016-05-13 DIAGNOSIS — M1712 Unilateral primary osteoarthritis, left knee: Secondary | ICD-10-CM | POA: Diagnosis not present

## 2016-05-13 DIAGNOSIS — M25562 Pain in left knee: Secondary | ICD-10-CM | POA: Diagnosis not present

## 2016-09-12 DIAGNOSIS — M25562 Pain in left knee: Secondary | ICD-10-CM | POA: Diagnosis not present

## 2016-09-12 DIAGNOSIS — M1712 Unilateral primary osteoarthritis, left knee: Secondary | ICD-10-CM | POA: Diagnosis not present

## 2016-09-12 DIAGNOSIS — S83242D Other tear of medial meniscus, current injury, left knee, subsequent encounter: Secondary | ICD-10-CM | POA: Diagnosis not present

## 2016-10-17 ENCOUNTER — Other Ambulatory Visit: Payer: Self-pay | Admitting: Physician Assistant

## 2016-10-17 DIAGNOSIS — I10 Essential (primary) hypertension: Secondary | ICD-10-CM

## 2016-10-17 DIAGNOSIS — E785 Hyperlipidemia, unspecified: Secondary | ICD-10-CM

## 2016-11-04 DIAGNOSIS — M25562 Pain in left knee: Secondary | ICD-10-CM | POA: Diagnosis not present

## 2016-11-04 DIAGNOSIS — M1712 Unilateral primary osteoarthritis, left knee: Secondary | ICD-10-CM | POA: Diagnosis not present

## 2016-12-03 ENCOUNTER — Ambulatory Visit (INDEPENDENT_AMBULATORY_CARE_PROVIDER_SITE_OTHER): Payer: BLUE CROSS/BLUE SHIELD | Admitting: Physician Assistant

## 2016-12-03 ENCOUNTER — Encounter: Payer: Self-pay | Admitting: Physician Assistant

## 2016-12-03 VITALS — BP 124/70 | HR 66 | Temp 98.0°F | Resp 18 | Ht 66.93 in | Wt 224.0 lb

## 2016-12-03 DIAGNOSIS — R6882 Decreased libido: Secondary | ICD-10-CM

## 2016-12-03 DIAGNOSIS — E785 Hyperlipidemia, unspecified: Secondary | ICD-10-CM

## 2016-12-03 DIAGNOSIS — E7800 Pure hypercholesterolemia, unspecified: Secondary | ICD-10-CM

## 2016-12-03 DIAGNOSIS — I10 Essential (primary) hypertension: Secondary | ICD-10-CM | POA: Diagnosis not present

## 2016-12-03 DIAGNOSIS — Z01818 Encounter for other preprocedural examination: Secondary | ICD-10-CM | POA: Diagnosis not present

## 2016-12-03 DIAGNOSIS — N529 Male erectile dysfunction, unspecified: Secondary | ICD-10-CM

## 2016-12-03 MED ORDER — LOSARTAN POTASSIUM-HCTZ 100-12.5 MG PO TABS: 1.0000 | ORAL_TABLET | Freq: Every day | ORAL | 1 refills | Status: DC

## 2016-12-03 MED ORDER — VERAPAMIL HCL ER 180 MG PO TBCR: 180.0000 mg | EXTENDED_RELEASE_TABLET | Freq: Every day | ORAL | 1 refills | Status: DC

## 2016-12-03 MED ORDER — ATORVASTATIN CALCIUM 20 MG PO TABS: 20.0000 mg | ORAL_TABLET | Freq: Every day | ORAL | 1 refills | Status: DC

## 2016-12-03 NOTE — Progress Notes (Signed)
Marc Ponce  MRN: [id number] DOB: [birth year]  PCP: Mancel Bale, PA-C  Chief Complaint  Patient presents with  . Medication Refill    Atorvastatin,losartan,verapamil    Subjective:  Pt presents to clinic for medication refill and the need for surgical clearance.  He was hoping to wait until the spring from left knee replacement but the pain and the immobility has worsened.  He tolerated his medication fine and has had no problems.  Able to walk and move around without SOB - limited by his pain - weight gain likely due to decrease activity - having to walk with cane due to pain.   Dr Noralee Chars - left knee replacement - planned but not scheduled.  Increase in stress recently-  Elderly parents with medical issues - one local and one in [place] communiting for work to [place] 2x/week  No home monitoring  History is obtained by patient.  Review of Systems  Constitutional: Negative for chills and fever.  Eyes: Negative for visual disturbance.  Respiratory: Negative for cough and shortness of breath.   Cardiovascular: Negative for chest pain, palpitations and leg swelling.  Genitourinary: Positive for difficulty urinating (slightly decreased stream and up at night 2x to urinate, no longer able to stand has to sit). Negative for frequency and urgency.  Neurological: Negative for dizziness, light-headedness and headaches.    Patient Active Problem List   Diagnosis Date Noted  . BMI 35.0-35.9,adult 07/10/2013  . HTN (hypertension) 03/12/2013  . Hyperlipidemia 03/12/2013  . Anal or rectal pain 05/24/2011  . Steatohepatitis 04/19/2011  . Alcohol abuse, daily use 04/19/2011  . Diverticulitis 04/16/2011    Current Outpatient Prescriptions on File Prior to Visit  Medication Sig Dispense Refill  . diclofenac (VOLTAREN) 75 MG EC tablet Take 75 mg by mouth 2 (two) times daily.    . lansoprazole (PREVACID) 30 MG capsule Take 30 mg by mouth daily at 12 noon.     No current  facility-administered medications on file prior to visit.     No Known Allergies  Past Medical History:  Diagnosis Date  . Diverticulitis   . GERD (gastroesophageal reflux disease)   . Hypertension   . Peri-rectal abscess   . Rectal pain    Social History   Social History Narrative  . No narrative on file   Social History  Substance Use Topics  . Smoking status: Former Smoker    Quit date: 04/01/1986  . Smokeless tobacco: Never Used  . Alcohol use Yes     Comment: 3 per day   family history includes Dementia in his mother; Heart disease in his father; Hypertension in his father.     Objective:  BP 124/70   Pulse 66   Temp 98 F (36.7 C) (Oral)   Resp 18   Ht 5' 6.93" (1.7 m)   Wt 224 lb (101.6 kg)   SpO2 96%   BMI 35.16 kg/m  Body mass index is 35.16 kg/m.  Physical Exam  Constitutional: He is oriented to person, place, and time and well-developed, well-nourished, and in no distress.  HENT:  Head: Normocephalic and atraumatic.  Right Ear: External ear normal.  Left Ear: External ear normal.  Eyes: Conjunctivae are normal.  Neck: Normal range of motion.  Cardiovascular: Normal rate, regular rhythm, normal heart sounds and intact distal pulses.   Pulmonary/Chest: Effort normal and breath sounds normal. He has no wheezes.  Musculoskeletal:       Right lower leg: He exhibits no  edema.       Left lower leg: He exhibits no edema.  Neurological: He is alert and oriented to person, place, and time. Gait normal.  Skin: Skin is warm and dry.  Psychiatric: Mood, memory, affect and judgment normal.    Rhythm: NSR at a rate of 65. Findings: nonspecific T wave changes in lead III Last EKG: none I have personally reviewed the EKG tracing and agree with the computerized printout. - reviewed with Dr Mitchel Honour.  Assessment and Plan :  Essential hypertension - Plan: Lipid panel, EKG 12-Lead, verapamil (CALAN-SR) 180 MG CR tablet, losartan-hydrochlorothiazide (HYZAAR)  100-12.5 MG tablet  Hyperlipidemia, unspecified hyperlipidemia type - Plan: CMP14+EGFR - check labs - medications were refilled  Preoperative clearance - Plan: CBC with Differential/Platelet, EKG 12-Lead - waiting on labs but patient does not seem to be at increased cardiovascular risk for surgery  Pure hypercholesterolemia - Plan: atorvastatin (LIPITOR) 20 MG tablet  Low libido - Plan: TestT+TestF+SHBG - check labs - sounds like from history pt also likely has BPH - this should be evaluated at a different visit  Impotence - Plan: TestT+TestF+SHBG  Windell Hummingbird PA-C  [facility] 12/05/2016 9:37 AM

## 2016-12-03 NOTE — Patient Instructions (Signed)
     IF you received an x-ray today, you will receive an invoice from [facility]. Please contact [facility] at [phone] with questions or concerns regarding your invoice.   IF you received labwork today, you will receive an invoice from [facility]. Please contact [facility] at [phone] with questions or concerns regarding your invoice.   Our billing staff will not be able to assist you with questions regarding bills from these companies.  You will be contacted with the lab results as soon as they are available. The fastest way to get your results is to activate your My Chart account. Instructions are located on the last page of this paperwork. If you have not heard from us regarding the results in 2 weeks, please contact this office.     

## 2016-12-04 LAB — CBC WITH DIFFERENTIAL/PLATELET
BASOS: 0 %
Basophils Absolute: 0 10*3/uL (ref 0.0–0.2)
EOS (ABSOLUTE): 0.1 10*3/uL (ref 0.0–0.4)
Eos: 2 %
Hematocrit: 38 % (ref 37.5–51.0)
Hemoglobin: 12.9 g/dL — ABNORMAL LOW (ref 13.0–17.7)
IMMATURE GRANS (ABS): 0 10*3/uL (ref 0.0–0.1)
IMMATURE GRANULOCYTES: 0 %
LYMPHS: 21 %
Lymphocytes Absolute: 1.4 10*3/uL (ref 0.7–3.1)
MCH: 32.5 pg (ref 26.6–33.0)
MCHC: 33.9 g/dL (ref 31.5–35.7)
MCV: 96 fL (ref 79–97)
MONOS ABS: 0.6 10*3/uL (ref 0.1–0.9)
Monocytes: 9 %
NEUTROS PCT: 68 %
Neutrophils Absolute: 4.4 10*3/uL (ref 1.4–7.0)
PLATELETS: 170 10*3/uL (ref 150–379)
RBC: 3.97 x10E6/uL — AB (ref 4.14–5.80)
RDW: 14.3 % (ref 12.3–15.4)
WBC: 6.5 10*3/uL (ref 3.4–10.8)

## 2016-12-04 LAB — CMP14+EGFR
ALT: 93 IU/L — AB (ref 0–44)
AST: 85 IU/L — AB (ref 0–40)
Albumin/Globulin Ratio: 1.5 (ref 1.2–2.2)
Albumin: 4.3 g/dL (ref 3.5–5.5)
Alkaline Phosphatase: 100 IU/L (ref 39–117)
BUN/Creatinine Ratio: 15 (ref 9–20)
BUN: 23 mg/dL (ref 6–24)
Bilirubin Total: 0.8 mg/dL (ref 0.0–1.2)
CALCIUM: 9.6 mg/dL (ref 8.7–10.2)
CO2: 21 mmol/L (ref 20–29)
Chloride: 101 mmol/L (ref 96–106)
Creatinine, Ser: 1.5 mg/dL — ABNORMAL HIGH (ref 0.76–1.27)
GFR calc Af Amer: 58 mL/min/{1.73_m2} — ABNORMAL LOW (ref >59)
GFR calc non Af Amer: 50 mL/min/{1.73_m2} — ABNORMAL LOW (ref >59)
Globulin, Total: 2.9 g/dL (ref 1.5–4.5)
Glucose: 107 mg/dL — ABNORMAL HIGH (ref 65–99)
POTASSIUM: 4 mmol/L (ref 3.5–5.2)
SODIUM: 139 mmol/L (ref 134–144)
TOTAL PROTEIN: 7.2 g/dL (ref 6.0–8.5)

## 2016-12-04 LAB — LIPID PANEL
CHOL/HDL RATIO: 2.7 ratio (ref 0.0–5.0)
CHOLESTEROL TOTAL: 159 mg/dL (ref 100–199)
HDL: 59 mg/dL (ref >39)
LDL Calculated: 66 mg/dL (ref 0–99)
TRIGLYCERIDES: 170 mg/dL — AB (ref 0–149)
VLDL Cholesterol Cal: 34 mg/dL (ref 5–40)

## 2016-12-06 LAB — TESTT+TESTF+SHBG
SEX HORMONE BINDING: 59.9 nmol/L (ref 19.3–76.4)
Testosterone, Free: 4 pg/mL — ABNORMAL LOW (ref 7.2–24.0)
Testosterone, total: 485.1 ng/dL (ref 264.0–916.0)

## 2017-01-23 DIAGNOSIS — M1712 Unilateral primary osteoarthritis, left knee: Secondary | ICD-10-CM | POA: Diagnosis not present

## 2017-01-23 DIAGNOSIS — M25562 Pain in left knee: Secondary | ICD-10-CM | POA: Diagnosis not present

## 2017-01-23 DIAGNOSIS — S83242D Other tear of medial meniscus, current injury, left knee, subsequent encounter: Secondary | ICD-10-CM | POA: Diagnosis not present

## 2017-03-16 ENCOUNTER — Encounter: Payer: Self-pay | Admitting: Physician Assistant

## 2017-03-22 DIAGNOSIS — M25562 Pain in left knee: Secondary | ICD-10-CM | POA: Diagnosis not present

## 2017-03-22 DIAGNOSIS — M1712 Unilateral primary osteoarthritis, left knee: Secondary | ICD-10-CM | POA: Diagnosis not present

## 2017-04-03 DIAGNOSIS — M1712 Unilateral primary osteoarthritis, left knee: Secondary | ICD-10-CM | POA: Diagnosis not present

## 2017-04-13 ENCOUNTER — Telehealth: Payer: Self-pay | Admitting: Physician Assistant

## 2017-04-13 ENCOUNTER — Encounter: Payer: Self-pay | Admitting: Physician Assistant

## 2017-04-13 NOTE — Telephone Encounter (Signed)
Copied from [place]. Topic: General - Other >> Apr 13, 2017  5:11 PM Patrice Paradise wrote: Reason for CRM: Marc Ponce is a patient of Windell Hummingbird and she is out of the office. He has been communication with [employer] on [place]. He would like for her to give him a call back asap. Pt did not elaborate on the reason for the call.   Please contact this patient. In my only contact with him, 03/16/2017, I advised that he should contact the office to schedule an Annual Exam, as the visit he had with Judson Roch earlier this year was not such a visit.

## 2017-04-14 NOTE — Telephone Encounter (Signed)
Tried to call patient did not get an answer--could not leave a message---will try again later---if he calls back he needs to back an apt for a CPE

## 2017-04-15 ENCOUNTER — Ambulatory Visit (INDEPENDENT_AMBULATORY_CARE_PROVIDER_SITE_OTHER): Payer: BLUE CROSS/BLUE SHIELD | Admitting: Physician Assistant

## 2017-04-15 ENCOUNTER — Ambulatory Visit (INDEPENDENT_AMBULATORY_CARE_PROVIDER_SITE_OTHER): Payer: BLUE CROSS/BLUE SHIELD

## 2017-04-15 ENCOUNTER — Other Ambulatory Visit: Payer: Self-pay

## 2017-04-15 ENCOUNTER — Encounter: Payer: Self-pay | Admitting: Physician Assistant

## 2017-04-15 VITALS — BP 140/66 | HR 80 | Temp 98.3°F | Resp 16 | Ht 66.0 in | Wt 230.2 lb

## 2017-04-15 DIAGNOSIS — Z1329 Encounter for screening for other suspected endocrine disorder: Secondary | ICD-10-CM | POA: Diagnosis not present

## 2017-04-15 DIAGNOSIS — Z125 Encounter for screening for malignant neoplasm of prostate: Secondary | ICD-10-CM

## 2017-04-15 DIAGNOSIS — R739 Hyperglycemia, unspecified: Secondary | ICD-10-CM

## 2017-04-15 DIAGNOSIS — I517 Cardiomegaly: Secondary | ICD-10-CM

## 2017-04-15 DIAGNOSIS — K7581 Nonalcoholic steatohepatitis (NASH): Secondary | ICD-10-CM

## 2017-04-15 DIAGNOSIS — Z0000 Encounter for general adult medical examination without abnormal findings: Secondary | ICD-10-CM | POA: Diagnosis not present

## 2017-04-15 DIAGNOSIS — E785 Hyperlipidemia, unspecified: Secondary | ICD-10-CM

## 2017-04-15 DIAGNOSIS — M7989 Other specified soft tissue disorders: Secondary | ICD-10-CM

## 2017-04-15 DIAGNOSIS — R9431 Abnormal electrocardiogram [ECG] [EKG]: Secondary | ICD-10-CM | POA: Diagnosis not present

## 2017-04-15 DIAGNOSIS — Z01818 Encounter for other preprocedural examination: Secondary | ICD-10-CM | POA: Diagnosis not present

## 2017-04-15 DIAGNOSIS — Z1389 Encounter for screening for other disorder: Secondary | ICD-10-CM

## 2017-04-15 DIAGNOSIS — I10 Essential (primary) hypertension: Secondary | ICD-10-CM

## 2017-04-15 LAB — POCT URINALYSIS DIP (MANUAL ENTRY)
BILIRUBIN UA: NEGATIVE
BILIRUBIN UA: NEGATIVE mg/dL
Blood, UA: NEGATIVE
GLUCOSE UA: NEGATIVE mg/dL
LEUKOCYTES UA: NEGATIVE
NITRITE UA: NEGATIVE
PH UA: 6 (ref 5.0–8.0)
Protein Ur, POC: NEGATIVE mg/dL
Spec Grav, UA: 1.01 (ref 1.010–1.025)
Urobilinogen, UA: 0.2 E.U./dL

## 2017-04-15 NOTE — Patient Instructions (Addendum)
We should have your lab work back within a week and will contact you with these results. If they are normal, we can complete your surgical clearance form.   Health Maintenance, Male A healthy lifestyle and preventive care is important for your health and wellness. Ask your health care provider about what schedule of regular examinations is right for you. What should I know about weight and diet? Eat a Healthy Diet  Eat plenty of vegetables, fruits, whole grains, low-fat dairy products, and lean protein.  Do not eat a lot of foods high in solid fats, added sugars, or salt.  Maintain a Healthy Weight Regular exercise can help you achieve or maintain a healthy weight. You should:  Do at least 150 minutes of exercise each week. The exercise should increase your heart rate and make you sweat (moderate-intensity exercise).  Do strength-training exercises at least twice a week.  Watch Your Levels of Cholesterol and Blood Lipids  Have your blood tested for lipids and cholesterol every 5 years starting at 60 years of age. If you are at high risk for heart disease, you should start having your blood tested when you are 60 years old. You may need to have your cholesterol levels checked more often if: ? Your lipid or cholesterol levels are high. ? You are older than 60 years of age. ? You are at high risk for heart disease.  What should I know about cancer screening? Many types of cancers can be detected early and may often be prevented. Lung Cancer  You should be screened every year for lung cancer if: ? You are a current smoker who has smoked for at least 30 years. ? You are a former smoker who has quit within the past 15 years.  Talk to your health care provider about your screening options, when you should start screening, and how often you should be screened.  Colorectal Cancer  Routine colorectal cancer screening usually begins at 60 years of age and should be repeated every 5-10 years  until you are 60 years old. You may need to be screened more often if early forms of precancerous polyps or small growths are found. Your health care provider may recommend screening at an earlier age if you have risk factors for colon cancer.  Your health care provider may recommend using home test kits to check for hidden blood in the stool.  A small camera at the end of a tube can be used to examine your colon (sigmoidoscopy or colonoscopy). This checks for the earliest forms of colorectal cancer.  Prostate and Testicular Cancer  Depending on your age and overall health, your health care provider may do certain tests to screen for prostate and testicular cancer.  Talk to your health care provider about any symptoms or concerns you have about testicular or prostate cancer.  Skin Cancer  Check your skin from head to toe regularly.  Tell your health care provider about any new moles or changes in moles, especially if: ? There is a change in a mole's size, shape, or color. ? You have a mole that is larger than a pencil eraser.  Always use sunscreen. Apply sunscreen liberally and repeat throughout the day.  Protect yourself by wearing long sleeves, pants, a wide-brimmed hat, and sunglasses when outside.  What should I know about heart disease, diabetes, and high blood pressure?  If you are 36-25 years of age, have your blood pressure checked every 3-5 years. If you are 40 years  of age or older, have your blood pressure checked every year. You should have your blood pressure measured twice-once when you are at a hospital or clinic, and once when you are not at a hospital or clinic. Record the average of the two measurements. To check your blood pressure when you are not at a hospital or clinic, you can use: ? An automated blood pressure machine at a pharmacy. ? A home blood pressure monitor.  Talk to your health care provider about your target blood pressure.  If you are between 50-72  years old, ask your health care provider if you should take aspirin to prevent heart disease.  Have regular diabetes screenings by checking your fasting blood sugar level. ? If you are at a normal weight and have a low risk for diabetes, have this test once every three years after the age of 62. ? If you are overweight and have a high risk for diabetes, consider being tested at a younger age or more often.  A one-time screening for abdominal aortic aneurysm (AAA) by ultrasound is recommended for men aged 63-75 years who are current or former smokers. What should I know about preventing infection? Hepatitis B If you have a higher risk for hepatitis B, you should be screened for this virus. Talk with your health care provider to find out if you are at risk for hepatitis B infection. Hepatitis C Blood testing is recommended for:  Everyone born from [birth year] through 1964.  Anyone with known risk factors for hepatitis C.  Sexually Transmitted Diseases (STDs)  You should be screened each year for STDs including gonorrhea and chlamydia if: ? You are sexually active and are younger than 60 years of age. ? You are older than 60 years of age and your health care provider tells you that you are at risk for this type of infection. ? Your sexual activity has changed since you were last screened and you are at an increased risk for chlamydia or gonorrhea. Ask your health care provider if you are at risk.  Talk with your health care provider about whether you are at high risk of being infected with HIV. Your health care provider may recommend a prescription medicine to help prevent HIV infection.  What else can I do?  Schedule regular health, dental, and eye exams.  Stay current with your vaccines (immunizations).  Do not use any tobacco products, such as cigarettes, chewing tobacco, and e-cigarettes. If you need help quitting, ask your health care provider.  Limit alcohol intake to no more than 2  drinks per day. One drink equals 12 ounces of beer, 5 ounces of wine, or 1 ounces of hard liquor.  Do not use street drugs.  Do not share needles.  Ask your health care provider for help if you need support or information about quitting drugs.  Tell your health care provider if you often feel depressed.  Tell your health care provider if you have ever been abused or do not feel safe at home. This information is not intended to replace advice given to you by your health care provider. Make sure you discuss any questions you have with your health care provider. Document Released: 10/15/2007 Document Revised: 12/16/2015 Document Reviewed: 01/20/2015 Elsevier Interactive Patient Education  2018 [employer].     IF you received an x-ray today, you will receive an invoice from [facility]. Please contact [facility] at [phone] with questions or concerns regarding your invoice.   IF you  received labwork today, you will receive an invoice from [facility]. Please contact [facility] at [phone] with questions or concerns regarding your invoice.   Our billing staff will not be able to assist you with questions regarding bills from these companies.  You will be contacted with the lab results as soon as they are available. The fastest way to get your results is to activate your My Chart account. Instructions are located on the last page of this paperwork. If you have not heard from [place] regarding the results in 2 weeks, please contact this office.

## 2017-04-15 NOTE — Progress Notes (Signed)
Marc Ponce  MRN: [id number] DOB: 22-Sep-1955  Subjective:  Pt is a 61 y.o. male who presents for annual physical exam. Pt is fasting today. Had two [place] today.   Last dental exam: 2018 Last vision exam: 2018, wears Rx eyeglasses  Last colonoscopy: 05/03/2007 Vaccinations      Tetanus: 06/27/2015      Zostavax: Never       Influenza: 2017  Chronic conditions:  1) HTN: Has had dx of HTN since 2015. Controlled hyzaar 100-12.71m and verapamil 1834mdaily. Does have some intermittent lower leg swelling that worsens throughout the day especially when he is at work and sitting for long period of time. Improves while at home or if he elevates his leg. Does not check BP outside of the office. Denies headache, chest pain, heart palpitations, orthopnea, SOB, visual issues, and dizziness.  2) HLD: Controlled on lipitor 2069maily.  3) GERD: Has had dx for 20 years. Has had upper endoscopy, which was normal. Takes prevacid 30 mg daily. Does not follow with GI anymore.   He also needs surgical clearance for TKR left knee. It is scheduled for 05/04/16. Has been very inactive over the past three years due to the pain. Has gained over 15 lbs.   In terms of cardiovascular risk: Pt is overweight, has HTN, limited exercise. He does not exercise due to knee pain. He did shovel snow for a long time the other day with no issues. Former smoker.  Quit in 1987. Drinks about 14 drinks per week. No PMH of heart disease. Did see a cardiology about 20 years ago and had stress test, which was normal.  Last EKG was 12/03/16 in our office and showed inverted T waves in 3, aVF, and V1 and low voltage in precordial leads.  Denies hx of chest pain, heart palpitations, SOB, DOE, diaphoresis, nausea, or vomiting.  FH of heart disease in father, PGM, and MGM.   In terms of pulmonary risk: Pt is a former smoker. Quit in 1987. Denies hx of COPD or asthma.   Patient Active Problem List   Diagnosis Date Noted  . BMI  35.0-35.9,adult 07/10/2013  . HTN (hypertension) 03/12/2013  . Hyperlipidemia 03/12/2013  . Anal or rectal pain 05/24/2011  . Steatohepatitis 04/19/2011  . Alcohol abuse, daily use 04/19/2011  . Diverticulitis 04/16/2011    Current Outpatient Medications on File Prior to Visit  Medication Sig Dispense Refill  . atorvastatin (LIPITOR) 20 MG tablet Take 1 tablet (20 mg total) by mouth daily. Office visit needed 90 tablet 1  . diclofenac (VOLTAREN) 75 MG EC tablet Take 75 mg by mouth 2 (two) times daily.    . lansoprazole (PREVACID) 30 MG capsule Take 30 mg by mouth daily at 12 noon.    . lMarland Kitchensartan-hydrochlorothiazide (HYZAAR) 100-12.5 MG tablet Take 1 tablet by mouth daily. 90 tablet 1  . verapamil (CALAN-SR) 180 MG CR tablet Take 1 tablet (180 mg total) by mouth at bedtime. 90 tablet 1   No current facility-administered medications on file prior to visit.     No Known Allergies  Social History   Socioeconomic History  . Marital status: Married    Spouse name: None  . Number of children: None  . Years of education: None  . Highest education level: None  Social Needs  . Financial resource strain: None  . Food insecurity - worry: None  . Food insecurity - inability: None  . Transportation needs - medical: None  .  Transportation needs - non-medical: None  Occupational History  . None  Tobacco Use  . Smoking status: Former Smoker    Last attempt to quit: 04/01/1986    Years since quitting: 31.0  . Smokeless tobacco: Never Used  Substance and Sexual Activity  . Alcohol use: Yes    Comment: 3 per day  . Drug use: No  . Sexual activity: Yes  Other Topics Concern  . None  Social History Narrative  . None    Past Surgical History:  Procedure Laterality Date  . FRACTURE SURGERY     left shoulder x 6 yrs ago and left x 42 yrs ago femur -knee cap  . NO PAST SURGERIES    . Orthoscopic surgery on Left Knee  04/10/2014  . plate insertion  6004, 2011   humerus 1977, femer  2011  . SHOULDER SURGERY  01/12/2010   Left     Family History  Problem Relation Age of Onset  . Dementia Mother   . Heart disease Father   . Hypertension Father     Review of Systems  Constitutional: Positive for activity change. Negative for appetite change, chills, diaphoresis, fatigue, fever and unexpected weight change.  HENT: Negative for congestion, dental problem, drooling, ear discharge, ear pain, facial swelling, hearing loss, mouth sores, nosebleeds, postnasal drip, rhinorrhea, sinus pressure, sinus pain, sneezing, sore throat, tinnitus, trouble swallowing and voice change.   Eyes: Negative for photophobia, pain, discharge, redness, itching and visual disturbance.  Respiratory: Negative for apnea, cough, choking, chest tightness, shortness of breath, wheezing and stridor.   Cardiovascular: Positive for leg swelling. Negative for chest pain and palpitations.  Gastrointestinal: Negative for abdominal distention, abdominal pain, anal bleeding, blood in stool, constipation, diarrhea, nausea, rectal pain and vomiting.  Endocrine: Negative for cold intolerance, heat intolerance, polydipsia, polyphagia and polyuria.  Genitourinary: Negative for decreased urine volume, difficulty urinating, discharge, dysuria, enuresis, flank pain, frequency, genital sores, hematuria, penile pain, penile swelling, scrotal swelling, testicular pain and urgency.  Musculoskeletal: Positive for arthralgias. Negative for back pain, gait problem, joint swelling, myalgias, neck pain and neck stiffness.  Skin: Negative for color change, pallor, rash and wound.  Allergic/Immunologic: Negative for environmental allergies, food allergies and immunocompromised state.  Neurological: Negative for dizziness, tremors, seizures, syncope, facial asymmetry, speech difficulty, weakness, light-headedness, numbness and headaches.  Hematological: Negative for adenopathy. Does not bruise/bleed easily.  Psychiatric/Behavioral:  Negative for agitation, behavioral problems, confusion, decreased concentration, dysphoric mood, hallucinations, self-injury, sleep disturbance and suicidal ideas. The patient is not nervous/anxious and is not hyperactive.     Objective:  BP 140/66 (BP Location: Left Arm, Patient Position: Sitting, Cuff Size: Large)   Pulse 80   Temp 98.3 F (36.8 C) (Oral)   Resp 16   Ht _0  (1.676 m)   Wt 230 lb 3.2 oz (104.4 kg)   SpO2 95%   BMI 37.16 kg/m   Physical Exam  Constitutional: He is oriented to person, place, and time and well-developed, well-nourished, and in no distress.  HENT:  Head: Normocephalic and atraumatic.  Right Ear: Hearing, tympanic membrane, external ear and ear canal normal.  Left Ear: Hearing, tympanic membrane, external ear and ear canal normal.  Nose: Nose normal.  Mouth/Throat: Uvula is midline, oropharynx is clear and moist and mucous membranes are normal. No oropharyngeal exudate.  Eyes: Conjunctivae and EOM are normal. Pupils are equal, round, and reactive to light.  Neck: Trachea normal and normal range of motion.  Cardiovascular: Normal rate, regular rhythm,  normal heart sounds and intact distal pulses.  Pulmonary/Chest: Effort normal and breath sounds normal. He has no wheezes. He has no rhonchi. He has no rales.  Abdominal: Soft. Normal appearance and bowel sounds are normal.  Musculoskeletal: Normal range of motion.       Right lower leg: He exhibits edema (2+ to mid shins).       Left lower leg: He exhibits edema (2+ to mid shins).  Varicose veins noted in bilateral lower extremities.  No overlying erythema or warmth noted.  Lymphadenopathy:       Head (right side): No submental, no submandibular, no tonsillar, no preauricular, no posterior auricular and no occipital adenopathy present.       Head (left side): No submental, no submandibular, no tonsillar, no preauricular, no posterior auricular and no occipital adenopathy present.    He has no cervical  adenopathy.       Right: No supraclavicular adenopathy present.       Left: No supraclavicular adenopathy present.  Neurological: He is alert and oriented to person, place, and time. He has normal sensation, normal strength and normal reflexes. Gait normal.  Skin: Skin is warm and dry.  Mild hyperpigmentation noted in bilateral lower extremities.  Psychiatric: Affect normal.  Vitals reviewed.   Visual Acuity Screening   Right eye Left eye Both eyes  Without correction:     With correction: _0   Wt Readings from Last 3 Encounters:  04/15/17 230 lb 3.2 oz (104.4 kg)  12/03/16 224 lb (101.6 kg)  01/09/16 226 lb 9.6 oz (102.8 kg)    BP Readings from Last 3 Encounters:  04/15/17 140/66  12/03/16 124/70  01/09/16 128/82    No results found for this or any previous visit (from the past 24 hour(s)). Dg Chest 2 View  Result Date: 04/15/2017 CLINICAL DATA:  Lower leg swelling EXAM: CHEST  2 VIEW COMPARISON:  01/12/2010 FINDINGS: Mild cardiomegaly. No confluent airspace opacities, effusions or edema. No acute bony abnormality. IMPRESSION: Mild cardiomegaly.  No active disease. Electronically Signed   By: Rolm Baptise M.D.   On: 04/15/2017 16:39    Assessment and Plan :  Discussed healthy lifestyle, diet, exercise, preventative care, vaccinations, and addressed patient's concerns.  Otherwise, plan for specific conditions below. This case was precepted with Dr. Pamella Pert. 1. Annual physical exam Await lab results.  2. Screening for hematuria or proteinuria - POCT urinalysis dipstick  3. Essential hypertension - CBC with Differential/Platelet - CMP14+EGFR  4. Steatohepatitis - CMP14+EGFR  5. Hyperlipidemia, unspecified hyperlipidemia type - Lipid panel  6. Hyperglycemia - Hemoglobin A1c  7. Screening for thyroid disorder - TSH  8. Screening PSA (prostate specific antigen) - PSA  9. Swelling of lower leg Chest x-ray with mild cardiomegaly.  Concern for  underlying heart failure.  Labs pending.  Differential also includes use of CCB and chronic venous insufficiency. - DG Chest 2 View; Future - Brain natriuretic peptide - Ambulatory referral to Cardiology  10. Mild cardiomegaly - Brain natriuretic peptide - Ambulatory referral to Cardiology  11. Pre-operative clearance Labs obtained today.  Depending on lab results, can clear him medically.  However, due to patient's history, symptoms, nonspecific findings on EKG, and new findings of mild cardiomegaly on chest x-ray, he warrants further evaluation from cardiology to be cleared from a cardiac standpoint.  12. Nonspecific abnormal electrocardiogram (ECG) (EKG) - Ambulatory referral to Cardiology  Tenna Delaine, PA-C  [facility] 04/17/2017 7:18  PM   

## 2017-04-16 LAB — CMP14+EGFR
ALT: 67 IU/L — ABNORMAL HIGH (ref 0–44)
AST: 90 IU/L — ABNORMAL HIGH (ref 0–40)
Albumin/Globulin Ratio: 1.4 (ref 1.2–2.2)
Albumin: 4.3 g/dL (ref 3.6–4.8)
Alkaline Phosphatase: 118 IU/L — ABNORMAL HIGH (ref 39–117)
BUN/Creatinine Ratio: 12 (ref 10–24)
BUN: 15 mg/dL (ref 8–27)
Bilirubin Total: 1 mg/dL (ref 0.0–1.2)
CO2: 21 mmol/L (ref 20–29)
Calcium: 9.3 mg/dL (ref 8.6–10.2)
Chloride: 104 mmol/L (ref 96–106)
Creatinine, Ser: 1.27 mg/dL (ref 0.76–1.27)
GFR calc Af Amer: 71 mL/min/{1.73_m2} (ref >59)
GFR calc non Af Amer: 61 mL/min/{1.73_m2} (ref >59)
Globulin, Total: 3.1 g/dL (ref 1.5–4.5)
Glucose: 102 mg/dL — ABNORMAL HIGH (ref 65–99)
Potassium: 4 mmol/L (ref 3.5–5.2)
Sodium: 141 mmol/L (ref 134–144)
Total Protein: 7.4 g/dL (ref 6.0–8.5)

## 2017-04-16 LAB — CBC WITH DIFFERENTIAL/PLATELET
BASOS ABS: 0 10*3/uL (ref 0.0–0.2)
Basos: 1 %
EOS (ABSOLUTE): 0.2 10*3/uL (ref 0.0–0.4)
Eos: 3 %
Hematocrit: 38.9 % (ref 37.5–51.0)
Hemoglobin: 13.3 g/dL (ref 13.0–17.7)
Immature Grans (Abs): 0 10*3/uL (ref 0.0–0.1)
Immature Granulocytes: 0 %
LYMPHS ABS: 1.4 10*3/uL (ref 0.7–3.1)
Lymphs: 18 %
MCH: 33.1 pg — AB (ref 26.6–33.0)
MCHC: 34.2 g/dL (ref 31.5–35.7)
MCV: 97 fL (ref 79–97)
MONOS ABS: 0.5 10*3/uL (ref 0.1–0.9)
Monocytes: 7 %
NEUTROS ABS: 5.4 10*3/uL (ref 1.4–7.0)
Neutrophils: 71 %
PLATELETS: 177 10*3/uL (ref 150–379)
RBC: 4.02 x10E6/uL — AB (ref 4.14–5.80)
RDW: 14.5 % (ref 12.3–15.4)
WBC: 7.6 10*3/uL (ref 3.4–10.8)

## 2017-04-16 LAB — LIPID PANEL
CHOLESTEROL TOTAL: 158 mg/dL (ref 100–199)
Chol/HDL Ratio: 3.4 ratio (ref 0.0–5.0)
HDL: 47 mg/dL (ref >39)
LDL CALC: 79 mg/dL (ref 0–99)
Triglycerides: 162 mg/dL — ABNORMAL HIGH (ref 0–149)
VLDL Cholesterol Cal: 32 mg/dL (ref 5–40)

## 2017-04-16 LAB — PSA: PROSTATE SPECIFIC AG, SERUM: 0.2 ng/mL (ref 0.0–4.0)

## 2017-04-16 LAB — HEMOGLOBIN A1C
ESTIMATED AVERAGE GLUCOSE: 117 mg/dL
Hgb A1c MFr Bld: 5.7 % — ABNORMAL HIGH (ref 4.8–5.6)

## 2017-04-16 LAB — TSH: TSH: 2.18 u[IU]/mL (ref 0.450–4.500)

## 2017-04-17 LAB — BRAIN NATRIURETIC PEPTIDE: BNP: 23.6 pg/mL (ref 0.0–100.0)

## 2017-04-17 NOTE — Telephone Encounter (Signed)
See patient's My Chart message re: form for surgery clearance.

## 2017-04-20 ENCOUNTER — Encounter: Payer: Self-pay | Admitting: Physician Assistant

## 2017-04-20 ENCOUNTER — Telehealth: Payer: Self-pay | Admitting: Physician Assistant

## 2017-04-20 NOTE — Telephone Encounter (Signed)
[place] did you want to run this by someone to have them call him back.

## 2017-04-20 NOTE — Telephone Encounter (Signed)
Copied from [place] [id number]. Topic: General - Other >> Apr 20, 2017  2:02 PM Valla Leaver wrote: Reason for CRM: Patient says he has an appt w/ cardiology in [facility] w/ Dr. Geraldo Pitter tomorrow morning, so he thinks results / a callback is no longer required. He's hoping they will request records on his behalf.

## 2017-04-20 NOTE — Telephone Encounter (Signed)
Copied from [place] [id number]. Topic: Quick Communication - See Telephone Encounter >> Apr 20, 2017  1:29 PM Robina Ade, Helene Kelp D wrote: CRM for notification. See Telephone encounter for: 04/20/17. Patient would like to talk to provider knee surgery. He got to different option about his knee clearance. Patient would like a call back by tomorrow because he has a f/u appt with his orthopaedic. He needs to know why his medical clearance is being Denied. Patient would like to talk to MD and not [place] please.

## 2017-04-21 ENCOUNTER — Encounter: Payer: Self-pay | Admitting: Cardiology

## 2017-04-21 ENCOUNTER — Ambulatory Visit: Payer: BLUE CROSS/BLUE SHIELD | Admitting: Cardiology

## 2017-04-21 ENCOUNTER — Telehealth (HOSPITAL_COMMUNITY): Payer: Self-pay | Admitting: *Deleted

## 2017-04-21 VITALS — BP 146/80 | HR 78 | Ht 66.0 in | Wt 229.0 lb

## 2017-04-21 DIAGNOSIS — E782 Mixed hyperlipidemia: Secondary | ICD-10-CM

## 2017-04-21 DIAGNOSIS — M7989 Other specified soft tissue disorders: Secondary | ICD-10-CM | POA: Diagnosis not present

## 2017-04-21 DIAGNOSIS — Z01810 Encounter for preprocedural cardiovascular examination: Secondary | ICD-10-CM | POA: Insufficient documentation

## 2017-04-21 DIAGNOSIS — R7989 Other specified abnormal findings of blood chemistry: Secondary | ICD-10-CM

## 2017-04-21 DIAGNOSIS — I10 Essential (primary) hypertension: Secondary | ICD-10-CM | POA: Diagnosis not present

## 2017-04-21 DIAGNOSIS — R945 Abnormal results of liver function studies: Secondary | ICD-10-CM | POA: Diagnosis not present

## 2017-04-21 NOTE — Telephone Encounter (Signed)
Left message on voicemail per DPR in reference to upcoming appointment scheduled on 04/28/17 at 9:30 with detailed instructions given per Myocardial Perfusion Study Information Sheet for the test. LM to arrive 15 minutes early, and that it is imperative to arrive on time for appointment to keep from having the test rescheduled. If you need to cancel or reschedule your appointment, please call the office within 24 hours of your appointment. Failure to do so may result in a cancellation of your appointment, and a $50 no show fee. Phone number given for call back for any questions. Marc Ponce

## 2017-04-21 NOTE — Patient Instructions (Signed)
Medication Instructions:  Your physician has recommended you make the following change in your medication:  STOP Atorvastatin  Labwork: None  Testing/Procedures: Your physician has requested that you have an echocardiogram. Echocardiography is a painless test that uses sound waves to create images of your heart. It provides your doctor with information about the size and shape of your heart and how well your heart's chambers and valves are working. This procedure takes approximately one hour. There are no restrictions for this procedure.  Your physician has requested that you have a lexiscan myoview. For further information please visit [email or web address]. Please follow instruction sheet, as given.  Lower extremity ultrasound  Follow-Up: Your physician recommends that you schedule a follow-up appointment in: 2 months  Any Other Special Instructions Will Be Listed Below (If Applicable).     If you need a refill on your cardiac medications before your next appointment, please call your pharmacy.   [place], RN, BSN   Echocardiogram An echocardiogram, or echocardiography, uses sound waves (ultrasound) to produce an image of your heart. The echocardiogram is simple, painless, obtained within a short period of time, and offers valuable information to your health care provider. The images from an echocardiogram can provide information such as:  Evidence of coronary artery disease (CAD).  Heart size.  Heart muscle function.  Heart valve function.  Aneurysm detection.  Evidence of a past heart attack.  Fluid buildup around the heart.  Heart muscle thickening.  Assess heart valve function.  Tell a health care provider about:  Any allergies you have.  All medicines you are taking, including vitamins, herbs, eye drops, creams, and over-the-counter medicines.  Any problems you or family members have had with anesthetic medicines.  Any blood disorders  you have.  Any surgeries you have had.  Any medical conditions you have.  Whether you are pregnant or may be pregnant. What happens before the procedure? No special preparation is needed. Eat and drink normally. What happens during the procedure?  In order to produce an image of your heart, gel will be applied to your chest and a wand-like tool (transducer) will be moved over your chest. The gel will help transmit the sound waves from the transducer. The sound waves will harmlessly bounce off your heart to allow the heart images to be captured in real-time motion. These images will then be recorded.  You may need an IV to receive a medicine that improves the quality of the pictures. What happens after the procedure? You may return to your normal schedule including diet, activities, and medicines, unless your health care provider tells you otherwise. This information is not intended to replace advice given to you by your health care provider. Make sure you discuss any questions you have with your health care provider. Document Released: 04/15/2000 Document Revised: 12/05/2015 Document Reviewed: 12/24/2012 Elsevier Interactive Patient Education  2017 [employer].

## 2017-04-21 NOTE — Progress Notes (Signed)
Cardiology Office Note:    Date:  04/21/2017   ID:  Marc Ponce, DOB [birth year], MRN [id number]  PCP:  Mancel Bale, PA-C  Cardiologist:  Jenean Lindau, MD   Referring MD: Tenna Delaine D, PA*    ASSESSMENT:    1. Essential hypertension   2. Mixed hyperlipidemia   3. Elevated LFTs   4. Preoperative cardiovascular examination    PLAN:    In order of problems listed above:  1. I discussed my findings with the patient at extensive length.  His blood pressure is stable. 2. Patient has history of alcohol abuse and his LFTs are elevated.  I have asked him to stop his statins at this point for review in the future.  He is elevated LFTs in the midst of alcohol abuse and statin use are of concern to me and I do not feel comfortable him to continue taking statin therapy in view of benefit risk ratio.  I discussed this with him.  Alcohol abstinence was discussed.  I will review this when I see him in 2 months. 3. Echocardiogram will be done to assess murmur heard on auscultation 4. Patient will undergo Lexiscan sestamibi testing to assess for objective evidence of coronary artery disease in view of multiple risk factors and a very sedentary lifestyle. 5. I am concerned about DVT and we will check that before we clear him for surgery. 6. Follow-up appointment in 2 months or earlier if he has any concerns. 7. If the above tests are unremarkable then he is not at high risk for coronary events during the aforementioned surgery.  Meticulous hemodynamic monitoring will further reduce the risk of coronary events.   Medication Adjustments/Labs and Tests Ordered: Current medicines are reviewed at length with the patient today.  Concerns regarding medicines are outlined above.  No orders of the defined types were placed in this encounter.  No orders of the defined types were placed in this encounter.    History of Present Illness:    Marc Ponce is a 61 y.o. male who is being  seen today for the evaluation of preop risk stratification from a cardiovascular standpoint at the request of Leonie Douglas, PA*.  Patient is a pleasant 61 year old male.  The patient has past medical history of essential hypertension, dyslipidemia, he tells me that he has prediabetes.  There is history of some increased alcohol use in the chart.  Patient leads a sedentary lifestyle.  He has issues with his knee and contemplating knee surgery on January 3.  For this reason he is sent here for evaluation.  He denies any chest pain orthopnea or PND again his lifestyle is very limited right now because of his orthopedic issues.  At the time of my evaluation, the patient is alert awake oriented and in no distress.  No syncope no dizzy spells or any such problems  Past Medical History:  Diagnosis Date  . Diverticulitis   . GERD (gastroesophageal reflux disease)   . Hypertension   . Peri-rectal abscess   . Rectal pain     Past Surgical History:  Procedure Laterality Date  . FRACTURE SURGERY     left shoulder x 6 yrs ago and left x 42 yrs ago femur -knee cap  . NO PAST SURGERIES    . Orthoscopic surgery on Left Knee  04/10/2014  . plate insertion  1275, 2011   humerus 1977, femer 2011  . SHOULDER SURGERY  01/12/2010   Left  Current Medications: Current Meds  Medication Sig  . atorvastatin (LIPITOR) 20 MG tablet Take 1 tablet (20 mg total) by mouth daily. Office visit needed  . diclofenac (VOLTAREN) 75 MG EC tablet Take 75 mg by mouth 2 (two) times daily.  . lansoprazole (PREVACID) 30 MG capsule Take 30 mg by mouth daily at 12 noon.  Marland Kitchen losartan-hydrochlorothiazide (HYZAAR) 100-12.5 MG tablet Take 1 tablet by mouth daily.  . verapamil (CALAN-SR) 180 MG CR tablet Take 1 tablet (180 mg total) by mouth at bedtime.     Allergies:   Patient has no known allergies.   Social History   Socioeconomic History  . Marital status: Married    Spouse name: None  . Number of children: None    . Years of education: None  . Highest education level: None  Social Needs  . Financial resource strain: None  . Food insecurity - worry: None  . Food insecurity - inability: None  . Transportation needs - medical: None  . Transportation needs - non-medical: None  Occupational History  . None  Tobacco Use  . Smoking status: Former Smoker    Last attempt to quit: 04/01/1986    Years since quitting: 31.0  . Smokeless tobacco: Never Used  Substance and Sexual Activity  . Alcohol use: Yes    Comment: 3 per day  . Drug use: No  . Sexual activity: Yes  Other Topics Concern  . None  Social History Narrative  . None     Family History: The patient's family history includes Dementia in his mother; Heart disease in his maternal grandmother; Heart disease (age of onset: 62) in his father; Hyperlipidemia in his father; Hypertension in his father.  ROS:   Please see the history of present illness.    All other systems reviewed and are negative.  EKGs/Labs/Other Studies Reviewed:    The following studies were reviewed today: I reviewed records at extensive length.  I reviewed lab work and his elevated LFTs are of concern to me.  EKG done today reveals sinus rhythm and otherwise unremarkable.   Recent Labs: 04/15/2017: ALT 67; BNP 23.6; BUN 15; Creatinine, Ser 1.27; Hemoglobin 13.3; Platelets 177; Potassium 4.0; Sodium 141; TSH 2.180  Recent Lipid Panel    Component Value Date/Time   CHOL 158 04/15/2017 1610   TRIG 162 (H) 04/15/2017 1610   HDL 47 04/15/2017 1610   CHOLHDL 3.4 04/15/2017 1610   CHOLHDL 2.9 01/09/2016 1037   VLDL 30 01/09/2016 1037   LDLCALC 79 04/15/2017 1610    Physical Exam:    VS:  BP (!) 146/80 (BP Location: Right Arm, Patient Position: Sitting, Cuff Size: Normal)   Pulse 78   Ht 5' 6"  (1.676 m)   Wt 229 lb (103.9 kg)   SpO2 96%   BMI 36.96 kg/m     Wt Readings from Last 3 Encounters:  04/21/17 229 lb (103.9 kg)  04/15/17 230 lb 3.2 oz (104.4 kg)   12/03/16 224 lb (101.6 kg)     GEN: Patient is in no acute distress HEENT: Normal NECK: No JVD; No carotid bruits LYMPHATICS: No lymphadenopathy CARDIAC: S1 S2 regular, 2/6 systolic murmur at the apex. RESPIRATORY:  Clear to auscultation without rales, wheezing or rhonchi  ABDOMEN: Soft, non-tender, non-distended MUSCULOSKELETAL: Patient has mild bilateral pedal edema however it is significantly more in the left lower extremity.  He mentions to me that this is been a problem for several weeks to months.; No deformity  SKIN: Warm  and dry NEUROLOGIC:  Alert and oriented x 3 PSYCHIATRIC:  Normal affect    Signed, Jenean Lindau, MD  04/21/2017 9:40 AM    [place]

## 2017-04-21 NOTE — Addendum Note (Signed)
Addended by: Mattie Marlin on: 04/21/2017 09:54 AM   Modules accepted: Orders

## 2017-04-26 ENCOUNTER — Ambulatory Visit (HOSPITAL_BASED_OUTPATIENT_CLINIC_OR_DEPARTMENT_OTHER)
Admission: RE | Admit: 2017-04-26 | Discharge: 2017-04-26 | Disposition: A | Discharge status: Home / Self Care | Payer: BLUE CROSS/BLUE SHIELD | Source: Ambulatory Visit | Attending: Cardiology | Admitting: Cardiology

## 2017-04-26 ENCOUNTER — Telehealth: Payer: Self-pay | Admitting: Physician Assistant

## 2017-04-26 ENCOUNTER — Encounter: Payer: Self-pay | Admitting: Physician Assistant

## 2017-04-26 DIAGNOSIS — M7989 Other specified soft tissue disorders: Secondary | ICD-10-CM | POA: Diagnosis not present

## 2017-04-26 NOTE — Progress Notes (Signed)
Bilateral venous duplex performed. No DVT seen

## 2017-04-26 NOTE — Telephone Encounter (Signed)
Patient needs a medical clearance form completed and faxed to [phone] stated that he is going to send Marc Ponce an [place] email explaining the form---I will place the form in [place] box on 04/26/17

## 2017-04-27 ENCOUNTER — Telehealth: Payer: Self-pay | Admitting: Cardiology

## 2017-04-27 NOTE — Telephone Encounter (Signed)
This was changed to only lexiscan.

## 2017-04-27 NOTE — Addendum Note (Signed)
Addended by: Mattie Marlin on: 04/27/2017 01:16 PM   Modules accepted: Orders

## 2017-04-27 NOTE — Addendum Note (Signed)
Addended by: Mattie Marlin on: 04/27/2017 01:08 PM   Modules accepted: Orders

## 2017-04-27 NOTE — Telephone Encounter (Signed)
Patient is questioning a test for tomorrow. His test is cardio medical clearance for knee surgery and it is stating treadmill on test and he cannot do treadmill.

## 2017-04-28 ENCOUNTER — Other Ambulatory Visit: Payer: Self-pay

## 2017-04-28 ENCOUNTER — Ambulatory Visit (HOSPITAL_COMMUNITY): Payer: BLUE CROSS/BLUE SHIELD | Attending: Internal Medicine

## 2017-04-28 ENCOUNTER — Encounter: Payer: BLUE CROSS/BLUE SHIELD | Admitting: Physician Assistant

## 2017-04-28 ENCOUNTER — Ambulatory Visit (HOSPITAL_BASED_OUTPATIENT_CLINIC_OR_DEPARTMENT_OTHER): Payer: BLUE CROSS/BLUE SHIELD

## 2017-04-28 DIAGNOSIS — E785 Hyperlipidemia, unspecified: Secondary | ICD-10-CM | POA: Insufficient documentation

## 2017-04-28 DIAGNOSIS — Z01810 Encounter for preprocedural cardiovascular examination: Secondary | ICD-10-CM | POA: Diagnosis not present

## 2017-04-28 DIAGNOSIS — Z01818 Encounter for other preprocedural examination: Secondary | ICD-10-CM

## 2017-04-28 DIAGNOSIS — M1712 Unilateral primary osteoarthritis, left knee: Secondary | ICD-10-CM | POA: Diagnosis not present

## 2017-04-28 DIAGNOSIS — I10 Essential (primary) hypertension: Secondary | ICD-10-CM | POA: Diagnosis not present

## 2017-04-28 LAB — MYOCARDIAL PERFUSION IMAGING
CHL CUP NUCLEAR SSS: 1
LHR: 0.35
LV sys vol: 56 mL
LVDIAVOL: 123 mL (ref 62–150)
NUC STRESS TID: 1.13
Peak HR: 85 {beats}/min
Rest HR: 63 {beats}/min
SDS: 1
SRS: 0

## 2017-04-28 MED ORDER — REGADENOSON 0.4 MG/5ML IV SOLN
0.4000 mg | Freq: Once | INTRAVENOUS | Status: AC
  Administered 2017-04-28: 0.4 mg via INTRAVENOUS

## 2017-04-28 MED ORDER — TECHNETIUM TC 99M TETROFOSMIN IV KIT
32.6000 | PACK | Freq: Once | INTRAVENOUS | Status: AC | PRN
  Administered 2017-04-28: 32.6 via INTRAVENOUS
  Filled 2017-04-28: qty 33

## 2017-04-28 MED ORDER — PERFLUTREN LIPID MICROSPHERE
1.0000 mL | INTRAVENOUS | Status: AC | PRN
  Administered 2017-04-28: 2 mL via INTRAVENOUS

## 2017-04-28 MED ORDER — TECHNETIUM TC 99M TETROFOSMIN IV KIT
10.9000 | PACK | Freq: Once | INTRAVENOUS | Status: AC | PRN
  Administered 2017-04-28: 10.9 via INTRAVENOUS
  Filled 2017-04-28: qty 11

## 2017-04-28 NOTE — Telephone Encounter (Signed)
Form (medical clearance for surgery) completed by provider.  Faxed to [phone].  Call to pt, advised him that form has been completed and faxed with receipt confirmation.  Per pt request, will leave a copy at [address] for pickup on 12/29.

## 2017-04-30 ENCOUNTER — Encounter: Payer: Self-pay | Admitting: Cardiology

## 2017-05-02 HISTORY — PX: REPLACEMENT TOTAL KNEE: SUR1224

## 2017-05-04 DIAGNOSIS — M1712 Unilateral primary osteoarthritis, left knee: Secondary | ICD-10-CM | POA: Diagnosis not present

## 2017-05-10 DIAGNOSIS — M25562 Pain in left knee: Secondary | ICD-10-CM | POA: Insufficient documentation

## 2017-05-24 ENCOUNTER — Other Ambulatory Visit: Payer: Self-pay | Admitting: *Deleted

## 2017-05-24 ENCOUNTER — Other Ambulatory Visit: Payer: Self-pay | Admitting: Physician Assistant

## 2017-05-24 DIAGNOSIS — I10 Essential (primary) hypertension: Secondary | ICD-10-CM

## 2017-05-26 DIAGNOSIS — Z96652 Presence of left artificial knee joint: Secondary | ICD-10-CM | POA: Insufficient documentation

## 2017-05-31 ENCOUNTER — Other Ambulatory Visit: Payer: Self-pay | Admitting: Physician Assistant

## 2017-05-31 DIAGNOSIS — E7800 Pure hypercholesterolemia, unspecified: Secondary | ICD-10-CM

## 2017-05-31 DIAGNOSIS — I10 Essential (primary) hypertension: Secondary | ICD-10-CM

## 2017-05-31 NOTE — Telephone Encounter (Signed)
Losartin-HCTZ refill Last OV: 04/15/17 - PE Last Refill: 12/03/16; #90; RF x1 Pharmacy: [facility] on [address].  Atorvastatin refill Last OV: 04/21/17 with Dr. Lennox Pippins; noted Atorvastatin was d/c'd, due to elevated LFT's and Alcohol Abuse Last Refill: n/a  Pharmacy: n/a

## 2017-06-22 ENCOUNTER — Other Ambulatory Visit: Payer: Self-pay

## 2017-06-22 ENCOUNTER — Ambulatory Visit: Payer: BLUE CROSS/BLUE SHIELD | Admitting: Cardiology

## 2017-06-22 ENCOUNTER — Encounter: Payer: Self-pay | Admitting: Cardiology

## 2017-06-22 VITALS — BP 140/70 | HR 70 | Ht 66.0 in | Wt 220.0 lb

## 2017-06-22 DIAGNOSIS — E782 Mixed hyperlipidemia: Secondary | ICD-10-CM | POA: Diagnosis not present

## 2017-06-22 DIAGNOSIS — I10 Essential (primary) hypertension: Secondary | ICD-10-CM

## 2017-06-22 MED ORDER — METOPROLOL SUCCINATE ER 50 MG PO TB24: 50.0000 mg | ORAL_TABLET | Freq: Every day | ORAL | 3 refills | Status: DC

## 2017-06-22 NOTE — Progress Notes (Signed)
Cardiology Office Note:    Date:  06/22/2017   ID:  Marc Ponce, DOB Feb 23, 1956, MRN [id number]  PCP:  Mancel Bale, PA-C  Cardiologist:  Jenean Lindau, MD   Referring MD: Mancel Bale, PA-C    ASSESSMENT:    1. Essential hypertension   2. Mixed hyperlipidemia    PLAN:    In order of problems listed above:  1. I discussed my findings with the patient at extensive length.  Stress test and echocardiogram and DVT study reports that was done last time was explained to him at length. 2. I am not sure if verapamil is the best medication for him at this time.  I have changed to Toprol-XL 50 mg daily.  He will keep a track of his pulse and blood pressures twice a day and send it to me in the mail in 2 weeks.  I will titrate his medications accordingly. 3. He will be seen in follow-up appointment in 6 months or earlier if he has any concerns.  His lipids will be followed by his primary care physician.  Weight reduction was stressed and risks of obesity explained and he vocalized understanding.   Medication Adjustments/Labs and Tests Ordered: Current medicines are reviewed at length with the patient today.  Concerns regarding medicines are outlined above.  No orders of the defined types were placed in this encounter.  Meds ordered this encounter  Medications  . metoprolol succinate (TOPROL-XL) 50 MG 24 hr tablet    Sig: Take 1 tablet (50 mg total) by mouth daily. Take with or immediately following a meal.    Dispense:  90 tablet    Refill:  3     Chief Complaint  Patient presents with  . Follow-up  . Hypertension  . Hyperlipidemia     History of Present Illness:    Marc Ponce is a 62 y.o. male.  Patient has history of essential hypertension and dyslipidemia and obesity.  He underwent surgery on his left knee and is recovering better.  He denies any problems at this time.  No chest pain orthopnea or PND.  His blood pressure is stable.  He is doing well with his  rehab therapy for his knee.  Past Medical History:  Diagnosis Date  . Diverticulitis   . GERD (gastroesophageal reflux disease)   . Hypertension   . Peri-rectal abscess   . Rectal pain     Past Surgical History:  Procedure Laterality Date  . FRACTURE SURGERY     left shoulder x 6 yrs ago and left x 42 yrs ago femur -knee cap  . NO PAST SURGERIES    . Orthoscopic surgery on Left Knee  04/10/2014  . plate insertion  3267, 2011   humerus 1977, femer 2011  . SHOULDER SURGERY  01/12/2010   Left     Current Medications: No outpatient medications have been marked as taking for the 06/22/17 encounter (Office Visit) with Rani Idler, Reita Cliche, MD.     Allergies:   Patient has no known allergies.   Social History   Socioeconomic History  . Marital status: Married    Spouse name: None  . Number of children: None  . Years of education: None  . Highest education level: None  Social Needs  . Financial resource strain: None  . Food insecurity - worry: None  . Food insecurity - inability: None  . Transportation needs - medical: None  . Transportation needs - non-medical: None  Occupational  History  . None  Tobacco Use  . Smoking status: Former Smoker    Last attempt to quit: 04/01/1986    Years since quitting: 31.2  . Smokeless tobacco: Never Used  Substance and Sexual Activity  . Alcohol use: Yes    Comment: 3 per day  . Drug use: No  . Sexual activity: Yes  Other Topics Concern  . None  Social History Narrative  . None     Family History: The patient's family history includes Dementia in his mother; Heart disease in his maternal grandmother; Heart disease (age of onset: 55) in his father; Hyperlipidemia in his father; Hypertension in his father.  ROS:   Please see the history of present illness.    All other systems reviewed and are negative.  EKGs/Labs/Other Studies Reviewed:    The following studies were reviewed today: Results of the echocardiogram and stress  test discussed with the patient at extensive length.   Recent Labs: 04/15/2017: ALT 67; BNP 23.6; BUN 15; Creatinine, Ser 1.27; Hemoglobin 13.3; Platelets 177; Potassium 4.0; Sodium 141; TSH 2.180  Recent Lipid Panel    Component Value Date/Time   CHOL 158 04/15/2017 1610   TRIG 162 (H) 04/15/2017 1610   HDL 47 04/15/2017 1610   CHOLHDL 3.4 04/15/2017 1610   CHOLHDL 2.9 01/09/2016 1037   VLDL 30 01/09/2016 1037   LDLCALC 79 04/15/2017 1610    Physical Exam:    VS:  BP 140/70 (BP Location: Left Arm, Patient Position: Sitting, Cuff Size: Normal)   Pulse 70   Ht 5' 6"  (1.676 m)   Wt 220 lb (99.8 kg)   SpO2 98%   BMI 35.51 kg/m     Wt Readings from Last 3 Encounters:  06/22/17 220 lb (99.8 kg)  04/21/17 229 lb (103.9 kg)  04/15/17 230 lb 3.2 oz (104.4 kg)     GEN: Patient is in no acute distress HEENT: Normal NECK: No JVD; No carotid bruits LYMPHATICS: No lymphadenopathy CARDIAC: Hear sounds regular, 2/6 systolic murmur at the apex. RESPIRATORY:  Clear to auscultation without rales, wheezing or rhonchi  ABDOMEN: Soft, non-tender, non-distended MUSCULOSKELETAL:  No edema; No deformity  SKIN: Warm and dry NEUROLOGIC:  Alert and oriented x 3 PSYCHIATRIC:  Normal affect   Signed, Jenean Lindau, MD  06/22/2017 9:31 AM    [place]

## 2017-06-22 NOTE — Patient Instructions (Signed)
Medication Instructions:  Your physician has recommended you make the following change in your medication:  STOP verapamil START metoprolol succinate (Toprol XL) 50 mg daily  Labwork: None  Testing/Procedures: None  Follow-Up: Your physician wants you to follow-up in: 6 months. You will receive a reminder letter in the mail two months in advance. If you don't receive a letter, please call our office to schedule the follow-up appointment.  Any Other Special Instructions Will Be Listed Below (If Applicable).     If you need a refill on your cardiac medications before your next appointment, please call your pharmacy.

## 2017-06-26 DIAGNOSIS — M25562 Pain in left knee: Secondary | ICD-10-CM | POA: Diagnosis not present

## 2017-06-28 DIAGNOSIS — M25562 Pain in left knee: Secondary | ICD-10-CM | POA: Diagnosis not present

## 2017-08-11 ENCOUNTER — Encounter: Payer: Self-pay | Admitting: Cardiology

## 2017-10-15 DIAGNOSIS — I8393 Asymptomatic varicose veins of bilateral lower extremities: Secondary | ICD-10-CM | POA: Diagnosis not present

## 2017-10-15 DIAGNOSIS — S90561A Insect bite (nonvenomous), right ankle, initial encounter: Secondary | ICD-10-CM | POA: Diagnosis not present

## 2017-10-16 ENCOUNTER — Ambulatory Visit: Payer: BLUE CROSS/BLUE SHIELD | Admitting: Physician Assistant

## 2017-10-19 ENCOUNTER — Telehealth: Payer: Self-pay

## 2017-10-19 NOTE — Telephone Encounter (Signed)
Not possible to see Friday afternoon- in fact may take months to get in as new patient. He can go ahead and schedule but we are booked up for 30 minute slots- perhaps have him see urgent care until then. Or I know this is not likely but check with Aldona Bar to see if she could do establish/acute eval.

## 2017-10-19 NOTE — Telephone Encounter (Signed)
Copied from [place] [id number]. Topic: Appointment Scheduling - Scheduling Inquiry for Clinic >> Oct 18, 2017  1:09 PM Boyd Kerbs wrote: Reason for CRM:    He is saying that Dr. Yong Channel would accept him as a patient.  Father is pt of his now  He was seen in clinic in [place] and has Celitis on right ankle (about size of his hand)   they told him to see PCP.  He does not have PCP right now.  He is on cephalexin right now 500 mg. Pills   Asking if Dr Yong Channel would see him and if possible get in on Friday afternoon.       Please advise

## 2017-10-26 NOTE — Telephone Encounter (Signed)
Called and spoke with patient who is adamant he wants to establish with Dr. Yong Channel as his father is a patient and he is well pleased with the care he has received. I am scheduling him for August 27. Advised patient that if he needs immediate care regarding the cellulitis to seek care at Urgent Care as he works out of [place] and is normally only home on Mondays

## 2017-11-27 ENCOUNTER — Telehealth: Payer: Self-pay | Admitting: *Deleted

## 2017-11-27 ENCOUNTER — Other Ambulatory Visit: Payer: Self-pay | Admitting: Physician Assistant

## 2017-11-27 DIAGNOSIS — I10 Essential (primary) hypertension: Secondary | ICD-10-CM

## 2017-11-27 MED ORDER — LOSARTAN POTASSIUM-HCTZ 100-12.5 MG PO TABS: 1.0000 | ORAL_TABLET | Freq: Every day | ORAL | 6 refills | Status: DC

## 2017-11-27 NOTE — Telephone Encounter (Signed)
Pt would like refill of his LOSARTIN-HCTZ 100-12.5. He will be seeing his Pcp the end of August but needs enough until he goes. Says didn't get around to discussing who would manage this med for him. Would like to [facility] [address].

## 2017-11-27 NOTE — Telephone Encounter (Signed)
Refill sent for losartan-hydrochlorothiazide to [facility] [place].

## 2017-12-26 ENCOUNTER — Encounter: Payer: Self-pay | Admitting: Family Medicine

## 2017-12-26 ENCOUNTER — Ambulatory Visit: Payer: BLUE CROSS/BLUE SHIELD | Admitting: Family Medicine

## 2017-12-26 VITALS — BP 130/76 | HR 97 | Temp 98.4°F | Ht 67.0 in | Wt 233.8 lb

## 2017-12-26 DIAGNOSIS — I10 Essential (primary) hypertension: Secondary | ICD-10-CM

## 2017-12-26 DIAGNOSIS — F1010 Alcohol abuse, uncomplicated: Secondary | ICD-10-CM | POA: Diagnosis not present

## 2017-12-26 DIAGNOSIS — E782 Mixed hyperlipidemia: Secondary | ICD-10-CM | POA: Diagnosis not present

## 2017-12-26 DIAGNOSIS — K7581 Nonalcoholic steatohepatitis (NASH): Secondary | ICD-10-CM | POA: Diagnosis not present

## 2017-12-26 DIAGNOSIS — Z872 Personal history of diseases of the skin and subcutaneous tissue: Secondary | ICD-10-CM | POA: Insufficient documentation

## 2017-12-26 DIAGNOSIS — K219 Gastro-esophageal reflux disease without esophagitis: Secondary | ICD-10-CM

## 2017-12-26 DIAGNOSIS — Z1211 Encounter for screening for malignant neoplasm of colon: Secondary | ICD-10-CM

## 2017-12-26 MED ORDER — LOSARTAN POTASSIUM-HCTZ 100-12.5 MG PO TABS: 1.0000 | ORAL_TABLET | Freq: Every day | ORAL | 3 refills | Status: DC

## 2017-12-26 NOTE — Assessment & Plan Note (Signed)
S: noted 2012. Also drinks alcohol to excess. Has had some LFT elevation Lab Results  Component Value Date   ALT 67 (H) 04/15/2017   AST 90 (H) 04/15/2017   ALKPHOS 118 (H) 04/15/2017   BILITOT 1.0 04/15/2017  A/P: strongly encouraged alcohol cessation and weight los/healthy habits

## 2017-12-26 NOTE — Progress Notes (Signed)
Phone: [phone]  Subjective:  Patient presents today to establish care.  Prior patient of [facility] of [facility]- has been there since 1995 with Dr. Laney [name]. Chief complaint-noted.   See problem oriented charting  The following were reviewed and entered/updated in epic: Past Medical History:  Diagnosis Date  . Allergy   . Arthritis   . Diverticulitis   . GERD (gastroesophageal reflux disease)   . History of chicken pox   . Hypertension   . Peri-rectal abscess   . Rectal pain    Patient Active Problem List   Diagnosis Date Noted  . Morbid obesity ([place]) 07/10/2013    Priority: High  . Alcohol abuse, daily use 04/19/2011    Priority: High  . GERD (gastroesophageal reflux disease) 12/26/2017    Priority: Medium  . Elevated LFTs 04/21/2017    Priority: Medium  . HTN (hypertension) 03/12/2013    Priority: Medium  . Hyperlipidemia 03/12/2013    Priority: Medium  . Steatohepatitis 04/19/2011    Priority: Medium  . History of cellulitis 12/26/2017    Priority: Low  . History of arthroplasty of left knee 05/26/2017    Priority: Low  . Pain in left knee 05/10/2017    Priority: Low   Past Surgical History:  Procedure Laterality Date  . arthoscopic surgery on Left Knee  04/10/2014  . FRACTURE SURGERY     left shoulder x 6 yrs ago and left x 42 yrs ago femur -knee cap  . 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 plate insertion  6160, 2011   humerus 1977, femur 2011  . REPLACEMENT TOTAL KNEE Left 2019   Dr. Alvan Dame. [facility]  . SHOULDER SURGERY  01/12/2010   Left     Family History  Problem Relation Age of Onset  . Dementia Mother   . Arthritis Mother   . Heart disease Father 28       MI   . Hypertension Father   . Hyperlipidemia Father   . COPD Father   . Diabetes Father   . Hearing loss Father   . Heart attack Father   . Kidney disease Father   . Heart disease  Maternal Grandmother   . Arthritis Maternal Grandmother   . Heart disease Maternal Grandfather   . Lung cancer Maternal Grandfather   . Heart disease Paternal Grandmother   . Stroke Paternal Grandmother   . Leukemia Paternal Grandfather    Medications- reviewed and updated Current Outpatient Medications  Medication Sig Dispense Refill  . lansoprazole (PREVACID) 30 MG capsule Take 30 mg by mouth daily at 12 noon.    Marland Kitchen losartan-hydrochlorothiazide (HYZAAR) 100-12.5 MG tablet Take 1 tablet by mouth daily. 90 tablet 3  . metoprolol succinate (TOPROL-XL) 50 MG 24 hr tablet Take 1 tablet (50 mg total) by mouth daily. Take with or immediately following a meal. 90 tablet 3   No current facility-administered medications for this visit.     Allergies-reviewed and updated No Known Allergies  Social History   Social History Narrative   Married. Adopted son of prior GF who died in fire, 2 step daughters.       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 Even in [place] driving to [place].       Hobbies: photography, site seeing, auto racing    ROS--Full ROS  was completed Review of Systems  Constitutional: Negative for chills and fever.  HENT: Negative for ear discharge and nosebleeds.   Eyes: Negative for pain and discharge.  Respiratory: Negative for cough and shortness of breath.   Cardiovascular: Negative for chest pain and palpitations.  Gastrointestinal: Positive for heartburn (controlled on otc). Negative for nausea.  Genitourinary: Negative for dysuria and urgency.  Musculoskeletal: Negative for falls and neck pain.  Skin: Positive for rash (hyperpigmented shin on left). Negative for itching.  Neurological: Negative for sensory change and speech change.  Endo/Heme/Allergies: Negative for polydipsia. Does not bruise/bleed easily.  Psychiatric/Behavioral: Negative for hallucinations and substance abuse.    Objective: BP 130/76 (BP Location: Left Arm, Patient Position: Sitting, Cuff Size: Large)   Pulse 97   Temp 98.4 F (36.9 C) (Oral)   Ht 5' 7"  (1.702 m)   Wt 233 lb 12.8 oz (106.1 kg)   SpO2 98%   BMI 36.62 kg/m  Gen: NAD, resting comfortably HEENT: Mucous membranes are moist. Oropharynx normal. TM normal. Eyes: sclera and lids normal, PERRLA Neck: no thyromegaly, no cervical lymphadenopathy CV: RRR no murmurs rubs or gallops Lungs: CTAB no crackles, wheeze, rhonchi Abdomen: soft/nontender/nondistended/normal bowel sounds. No rebound or guarding.  Ext: trace edema, some hyperpigmentation on left leg from area of prior cellulitis Skin: warm, dry Neuro: 5/5 strength in upper and lower extremities, normal gait, normal reflexes  Assessment/Plan:  Other notes: 1.worries about heart disease potential given fathers history.   2. He knows he is not eating well- commuting makes this tough- since 1999 3. Has requested to not commute- opportunity within [place] so he could drive and be home at nights.  4. Seeing Dr. Laney [name] every 2 years or so. Small bonus with work if gets annual check up.  5. 2 cases cellulitis after his knee surgery, then one on right foot   HTN (hypertension) S: controlled on Losartan-hctz 100-12.71m and metoprolol 521mXR BP Readings from Last 3 Encounters:  12/26/17 130/76  06/22/17 140/70  04/21/17 (!) 146/80  A/P: We discussed blood pressure goal of <140/90. Continue current meds  Morbid obesity (HCC) BMI >35 plus 2 comorbid conditions- actually 4 (HTN, HLD, OA, and steatohepatitis) . Encouraged alcohol decrease, exercise as able, healthy eating   GERD (gastroesophageal reflux disease) S: controlled on Lansoprazole 3075mnce a day A/P: continue current rx   Alcohol abuse, daily use S:3-6 glasses of wine reported today . Driven by work/family stress[place]: from avs: "04/15/18 or later- please schedule for  physical  Before visit I want you to strongly consider at absolute max cutting to 2 alcoholic beverages per day. Would prefer for you to cut the alcohol out completely. Please strongly consider AA meetings- tons available in [place]"   Steatohepatitis S: noted 2012. Also drinks alcohol to excess. Has had some LFT elevation Lab Results  Component Value Date   ALT 67 (H) 04/15/2017   AST 90 (H) 04/15/2017   ALKPHOS 118 (H) 04/15/2017   BILITOT 1.0 04/15/2017  A/P: strongly encouraged alcohol cessation and weight los/healthy habits    Future Appointments  Date Time Provider DepAnimas2/06/2017  2:15 PM HunMarin OlpD [facility]   Return in about 3 months (around 03/28/2018) for physical.   Lab/Order associations: Colon cancer screening - Plan: Ambulatory referral to Gastroenterology  Steatohepatitis  Alcohol abuse, daily use  Mixed hyperlipidemia  Essential hypertension - Plan: losartan-hydrochlorothiazide (HYZAAR) 100-12.5 MG tablet  Morbid obesity (  [place])  Gastroesophageal reflux disease without esophagitis  History of cellulitis  Meds ordered this encounter  Medications  . losartan-hydrochlorothiazide (HYZAAR) 100-12.5 MG tablet    Sig: Take 1 tablet by mouth daily.    Dispense:  90 tablet    Refill:  3    Return precautions advised. Garret Reddish, MD

## 2017-12-26 NOTE — Patient Instructions (Addendum)
Health Maintenance Due  Topic Date Due  . COLONOSCOPY -We will call you within two weeks about your referral to GI for colonoscopy. If you do not hear within 3 weeks, give [place] a call.  05/02/2017  . INFLUENZA VACCINE -please call our office to schedule this in October/November 11/30/2017   04/15/18 or later- please schedule for physical  Before visit I want you to strongly consider at absolute max cutting to 2 alcoholic beverages per day. Would prefer for you to cut the alcohol out completely. Please strongly consider AA meetings- tons available in [place]  Other medical issues appear stable. We will update labs at follow up- including liver test and cholesterol- if able to get off alcohol and liver better may be able to restart cholesterol medicine. Cutting alcohol and starting statin would lower your overall risk for heart disease   shingrix wait list for Marc Ponce please  Would love to see you drop 5 lbs by follow up

## 2017-12-26 NOTE — Assessment & Plan Note (Addendum)
BMI >35 plus 2 comorbid conditions- actually 4 (HTN, HLD, OA, and steatohepatitis) . Encouraged alcohol decrease, exercise as able, healthy eating

## 2017-12-26 NOTE — Assessment & Plan Note (Addendum)
S: controlled on Losartan-hctz 100-12.68m and metoprolol 513mXR BP Readings from Last 3 Encounters:  12/26/17 130/76  06/22/17 140/70  04/21/17 (!) 146/80  A/P: We discussed blood pressure goal of <140/90. Continue current meds

## 2017-12-26 NOTE — Assessment & Plan Note (Signed)
S:3-6 glasses of wine reported today . Driven by work/family stress- mom with alzheimers in [place].  A/P: from avs: "04/15/18 or later- please schedule for physical  Before visit I want you to strongly consider at absolute max cutting to 2 alcoholic beverages per day. Would prefer for you to cut the alcohol out completely. Please strongly consider AA meetings- tons available in [place] [employer]

## 2017-12-26 NOTE — Assessment & Plan Note (Signed)
S: controlled on Lansoprazole 68m once a day A/P: continue current rx

## 2018-01-05 ENCOUNTER — Telehealth: Payer: Self-pay | Admitting: Surgical

## 2018-01-05 NOTE — Telephone Encounter (Signed)
Left message for patient to call the office to schedule a nurse visit for Shingrix injection.

## 2018-02-28 DIAGNOSIS — Z471 Aftercare following joint replacement surgery: Secondary | ICD-10-CM | POA: Diagnosis not present

## 2018-02-28 DIAGNOSIS — Z96652 Presence of left artificial knee joint: Secondary | ICD-10-CM | POA: Diagnosis not present

## 2018-02-28 DIAGNOSIS — M25861 Other specified joint disorders, right knee: Secondary | ICD-10-CM | POA: Diagnosis not present

## 2018-03-12 ENCOUNTER — Encounter: Payer: Self-pay | Admitting: Family Medicine

## 2018-03-23 ENCOUNTER — Other Ambulatory Visit: Payer: Self-pay | Admitting: Family Medicine

## 2018-03-23 ENCOUNTER — Other Ambulatory Visit: Payer: Self-pay

## 2018-03-23 DIAGNOSIS — I10 Essential (primary) hypertension: Secondary | ICD-10-CM

## 2018-03-23 MED ORDER — LOSARTAN POTASSIUM 100 MG PO TABS: 100.0000 mg | ORAL_TABLET | Freq: Every day | ORAL | 1 refills | Status: DC

## 2018-04-02 ENCOUNTER — Ambulatory Visit: Payer: BLUE CROSS/BLUE SHIELD | Admitting: Family Medicine

## 2018-04-16 ENCOUNTER — Encounter: Payer: Self-pay | Admitting: Family Medicine

## 2018-04-16 ENCOUNTER — Ambulatory Visit (INDEPENDENT_AMBULATORY_CARE_PROVIDER_SITE_OTHER): Payer: BLUE CROSS/BLUE SHIELD | Admitting: Family Medicine

## 2018-04-16 VITALS — BP 128/78 | HR 94 | Temp 98.4°F | Ht 67.0 in | Wt 231.6 lb

## 2018-04-16 DIAGNOSIS — K219 Gastro-esophageal reflux disease without esophagitis: Secondary | ICD-10-CM

## 2018-04-16 DIAGNOSIS — Z23 Encounter for immunization: Secondary | ICD-10-CM

## 2018-04-16 DIAGNOSIS — F1010 Alcohol abuse, uncomplicated: Secondary | ICD-10-CM

## 2018-04-16 DIAGNOSIS — K7581 Nonalcoholic steatohepatitis (NASH): Secondary | ICD-10-CM

## 2018-04-16 DIAGNOSIS — Z0000 Encounter for general adult medical examination without abnormal findings: Secondary | ICD-10-CM

## 2018-04-16 DIAGNOSIS — E782 Mixed hyperlipidemia: Secondary | ICD-10-CM | POA: Diagnosis not present

## 2018-04-16 DIAGNOSIS — I10 Essential (primary) hypertension: Secondary | ICD-10-CM

## 2018-04-16 DIAGNOSIS — Z125 Encounter for screening for malignant neoplasm of prostate: Secondary | ICD-10-CM | POA: Diagnosis not present

## 2018-04-16 LAB — LIPID PANEL
CHOLESTEROL: 199 mg/dL (ref 0–200)
HDL: 40.9 mg/dL (ref >39.00)
NonHDL: 158.35
Total CHOL/HDL Ratio: 5
Triglycerides: 225 mg/dL — ABNORMAL HIGH (ref 0.0–149.0)
VLDL: 45 mg/dL — ABNORMAL HIGH (ref 0.0–40.0)

## 2018-04-16 LAB — CBC
HCT: 40.2 % (ref 39.0–52.0)
HEMOGLOBIN: 13.4 g/dL (ref 13.0–17.0)
MCHC: 33.4 g/dL (ref 30.0–36.0)
MCV: 94 fl (ref 78.0–100.0)
PLATELETS: 152 10*3/uL (ref 150.0–400.0)
RBC: 4.28 Mil/uL (ref 4.22–5.81)
RDW: 16.2 % — AB (ref 11.5–15.5)
WBC: 5.9 10*3/uL (ref 4.0–10.5)

## 2018-04-16 LAB — COMPREHENSIVE METABOLIC PANEL
ALT: 46 U/L (ref 0–53)
AST: 57 U/L — ABNORMAL HIGH (ref 0–37)
Albumin: 4 g/dL (ref 3.5–5.2)
Alkaline Phosphatase: 86 U/L (ref 39–117)
BUN: 20 mg/dL (ref 6–23)
CALCIUM: 9.5 mg/dL (ref 8.4–10.5)
CHLORIDE: 102 meq/L (ref 96–112)
CO2: 27 meq/L (ref 19–32)
Creatinine, Ser: 1.09 mg/dL (ref 0.40–1.50)
GFR: 73.05 mL/min (ref >60.00)
GLUCOSE: 146 mg/dL — AB (ref 70–99)
Potassium: 4.1 mEq/L (ref 3.5–5.1)
Sodium: 138 mEq/L (ref 135–145)
Total Bilirubin: 1.4 mg/dL — ABNORMAL HIGH (ref 0.2–1.2)
Total Protein: 7.3 g/dL (ref 6.0–8.3)

## 2018-04-16 LAB — PSA: PSA: 2.62 ng/mL (ref 0.10–4.00)

## 2018-04-16 LAB — LDL CHOLESTEROL, DIRECT: LDL DIRECT: 119 mg/dL

## 2018-04-16 NOTE — Patient Instructions (Addendum)
Health Maintenance Due  Topic Date Due  . COLONOSCOPY -now due for colonoscopy- he is in process of finalizing location/position for new job- will let us know when we can refer for colonoscopy- if referred now he is not sure could complete 05/02/2017  . INFLUENZA VACCINE - team he has had flu shot- please abstract this with date/location 11/30/2017   Shingrix #1 today. Repeat injection in 2-5 months. Schedule this before you leave.   Please stop by lab before you go

## 2018-04-16 NOTE — Progress Notes (Addendum)
Phone: [phone]  Subjective:  Patient presents today for their annual physical. Chief complaint-noted.   See problem oriented charting- ROS- full  review of systems was completed and negative except for: knee pain, occasional edema, some mild loose stools  The following were reviewed and entered/updated in epic: Past Medical History:  Diagnosis Date  . Allergy   . Arthritis   . Diverticulitis   . GERD (gastroesophageal reflux disease)   . History of chicken pox   . Hypertension   . Peri-rectal abscess   . Rectal pain    Patient Active Problem List   Diagnosis Date Noted  . Morbid obesity ([place]) 07/10/2013    Priority: High  . Alcohol abuse, daily use 04/19/2011    Priority: High  . GERD (gastroesophageal reflux disease) 12/26/2017    Priority: Medium  . Elevated LFTs 04/21/2017    Priority: Medium  . HTN (hypertension) 03/12/2013    Priority: Medium  . Hyperlipidemia 03/12/2013    Priority: Medium  . Steatohepatitis 04/19/2011    Priority: Medium  . History of cellulitis 12/26/2017    Priority: Low  . History of arthroplasty of left knee 05/26/2017    Priority: Low  . Pain in left knee 05/10/2017    Priority: Low   Past Surgical History:  Procedure Laterality Date  . arthoscopic surgery on Left Knee  04/10/2014  . FRACTURE SURGERY     left shoulder x 6 yrs ago and left x 42 yrs ago femur -knee cap  . 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 plate insertion  8466, 2011   humerus 1977, femur 2011  . REPLACEMENT TOTAL KNEE Left 2019   Dr. Alvan Dame. [facility]  . SHOULDER SURGERY  01/12/2010   Left     Family History  Problem Relation Age of Onset  . Dementia Mother   . Arthritis Mother   . Heart disease Father 73       MI   . Hypertension Father   . Hyperlipidemia Father   . COPD Father   . Diabetes Father   . Hearing loss Father   . Heart attack Father   . Kidney  disease Father   . Heart disease Maternal Grandmother   . Arthritis Maternal Grandmother   . Heart disease Maternal Grandfather   . Lung cancer Maternal Grandfather   . Heart disease Paternal Grandmother   . Stroke Paternal Grandmother   . Leukemia Paternal Grandfather     Medications- reviewed and updated Current Outpatient Medications  Medication Sig Dispense Refill  . hydrochlorothiazide (HYDRODIURIL) 12.5 MG tablet Take 1 tablet (12.5 mg total) by mouth daily. 90 tablet 1  . lansoprazole (PREVACID) 30 MG capsule Take 30 mg by mouth daily at 12 noon.    Marland Kitchen losartan (COZAAR) 100 MG tablet Take 1 tablet (100 mg total) by mouth daily. 90 tablet 1  . metoprolol succinate (TOPROL-XL) 50 MG 24 hr tablet Take 50 mg by mouth daily. Take with or immediately following a meal.     No current facility-administered medications for this visit.     Allergies-reviewed and updated No Known Allergies  Social History   Social History Narrative   Married. Adopted son of prior GF who died in fire, 2 step daughters.       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 Even  in [place] driving to [place].       Hobbies: photography, site seeing, auto racing    Objective: BP 128/78 (BP Location: Left Arm, Patient Position: Sitting, Cuff Size: Normal)   Pulse 94   Temp 98.4 F (36.9 C) (Oral)   Ht 5' 7"  (1.702 m)   Wt 231 lb 9.6 oz (105.1 kg)   SpO2 (!) 96%   BMI 36.27 kg/m  Gen: NAD, resting comfortably HEENT: Mucous membranes are moist. Oropharynx normal Neck: no thyromegaly CV: RRR no murmurs rubs or gallops Lungs: CTAB no crackles, wheeze, rhonchi Abdomen: soft/nontender/nondistended/normal bowel sounds. No rebound or guarding. Obese- diastasis recti noted. Small umbilical hernia noted Ext: trace edema Skin: warm, dry Neuro: grossly normal, moves all extremities, PERRLA  Assessment/Plan:  62 y.o. male presenting  for annual physical.  Health Maintenance counseling: 1. Anticipatory guidance: Patient counseled regarding regular dental exams -q6 months (he does q3 months)-recent wisdom tooth removal-still some discomfort-just before Thanksgiving- has bone graft, eye exams -yearly,  avoiding smoking and second hand smoke, limiting alcohol to 2 beverages per day - see below- discussed cutting down.   2. Risk factor reduction:  Advised patient of need for regular exercise and diet rich and fruits and vegetables to reduce risk of heart attack and stroke. Exercise- not exercising- hoping to start with knee doing better. Diet-mild improvements in diet- cutting down on alcohol would really help calorie intake .  Wt Readings from Last 3 Encounters:  04/16/18 231 lb 9.6 oz (105.1 kg)  12/26/17 233 lb 12.8 oz (106.1 kg)  06/22/17 220 lb (99.8 kg)  3. Immunizations/screenings/ancillary studies-flu shot already done - will be input by team . shingrix start today  Immunization History  Administered Date(s) Administered  . Influenza,inj,Quad PF,6+ Mos 06/27/2015, 01/09/2016  . Influenza-Unspecified 01/31/2018  . Tdap 06/27/2015  . Zoster Recombinat (Shingrix) 04/16/2018  4. Prostate cancer screening- asks to skip rectal exam. Will get PSA today and future rectal exam if concerning trend.   Lab Results  Component Value Date   PSA 0.22 06/27/2015   5. Colon cancer screening - now due for colonoscopy- he is in process of finalizing location/position for new job- will let us know when we can refer for colonoscopy- if referred now he is not sure could complete 6. Skin cancer screening- no dermatologist. advised regular sunscreen use. Denies worrisome, changing, or new skin lesions.  7.  Former smoker-quit 1987.  Still want a AAA scan at 18  Status of chronic or acute concerns   Patient was commuting a lot- now has job that will be in same place- he is thrilled about this.   Ortho issues 1. Has had some bursitis right  knee- monitoring with ortho 2. Thumb pain particularly in morning- denies prolonged am stiffness (disolcated left thumb when teenager). Clinches down at night grabbing things at night- can tell when he wakes up he is grabbing is pillow- in AM he feels like he cannot grip quite as well in both hands- more in AM and better as day goes on. Lasts about 2 hours. Pain at MTP joint. Joint does not swell.  - discussed could trial icing - if it worsens, consider sports med or ortho evaluation 3. Knee finally starting to do better on left almost a year ago  When penis is flaccid- notes this feels smaller- seems worse since metoprolol. Sits to urinate- has hard time finding penis with his foreskin - discussed exercise and weight loss as may help - had testosterone  checked 2 years ago and reports normal- states was beginning to have issue at that time.   Hypertension-controlled on losartan hydrochlorothiazide 100-12.5 mg and metoprolol 50 mg extended release  Morbid obesity-BMI over 35 with hypertension, hyperlipidemia, osteoarthritis, steatohepatitis- needs to cut down on alcohol/out if possible.  Have advised healthy eating and regular exercise- down 2 lbs from last visit- he states has improved diet slightly  GERD-controlled on lansoprazole 30 mg once a day  Alcohol abuse- 3 to 6 glasses of wine daily.  We encouraged cessation or adamant on cutting to 2/day- he has stuck closer to 3 beverages.  We discussed using AA   Steatohepatitis-noted 2012- drinks alcohol in excess.  Has had mild LFT elevations-update today  Future Appointments  Date Time Provider [place]  07/17/2018  9:30 AM [facility] NURSE [facility]  10/15/2018  8:00 AM [name], MD [facility]   Return in about 6 months (around 10/16/2018).  Lab/Order associations: FASTING  Preventative health care - Plan: CBC, Comprehensive metabolic panel, Lipid panel, PSA  Mixed hyperlipidemia - Plan: CBC, Comprehensive metabolic  panel, Lipid panel  Screening for prostate cancer - Plan: PSA  Morbid obesity ([place])  Gastroesophageal reflux disease without esophagitis  Essential hypertension  Steatohepatitis  Alcohol abuse, daily use  Need for prophylactic vaccination and inoculation against varicella - Plan: Varicella-zoster vaccine IM  Return precautions advised.  Garret Reddish, MD

## 2018-04-16 NOTE — Addendum Note (Signed)
Addended by: Lyndle Herrlich on: 04/16/2018 08:43 AM   Modules accepted: Orders

## 2018-04-17 ENCOUNTER — Ambulatory Visit: Payer: BLUE CROSS/BLUE SHIELD | Admitting: Family Medicine

## 2018-04-17 ENCOUNTER — Other Ambulatory Visit: Payer: Self-pay

## 2018-04-17 DIAGNOSIS — R739 Hyperglycemia, unspecified: Secondary | ICD-10-CM

## 2018-04-17 DIAGNOSIS — R972 Elevated prostate specific antigen [PSA]: Secondary | ICD-10-CM

## 2018-05-21 DIAGNOSIS — Z96652 Presence of left artificial knee joint: Secondary | ICD-10-CM | POA: Diagnosis not present

## 2018-05-21 DIAGNOSIS — Z471 Aftercare following joint replacement surgery: Secondary | ICD-10-CM | POA: Diagnosis not present

## 2018-06-15 ENCOUNTER — Other Ambulatory Visit: Payer: Self-pay | Admitting: Family Medicine

## 2018-06-15 NOTE — Telephone Encounter (Signed)
Okay for refill?  Last filled by historical provider

## 2018-06-19 ENCOUNTER — Other Ambulatory Visit: Payer: Self-pay | Admitting: Family Medicine

## 2018-06-19 MED ORDER — LOSARTAN POTASSIUM-HCTZ 100-12.5 MG PO TABS: 1.0000 | ORAL_TABLET | Freq: Every day | ORAL | 3 refills | Status: DC

## 2018-07-03 ENCOUNTER — Ambulatory Visit: Payer: BLUE CROSS/BLUE SHIELD | Admitting: Family Medicine

## 2018-07-03 ENCOUNTER — Telehealth: Payer: Self-pay | Admitting: Family Medicine

## 2018-07-03 NOTE — Telephone Encounter (Signed)
Spoke to pt son and stated that hi father can be seen today at 34 per Dr.Hunter.

## 2018-07-03 NOTE — Telephone Encounter (Signed)
Copied from [place] [phone]. Topic: General - Other >> Jul 03, 2018  8:21 AM Oneta Rack wrote: Caller name: Avraham Benish  Relation to pt: son   Call back number:  [phone]    Reason for call:  Head cold, chest congestion, vomited 1x last night, fatigue, head ache, coughing, patient declined to see another PCP due to the relationship he has with Dr. Yong Channel. Son requesting patient be worked in, please advise

## 2018-07-03 NOTE — Telephone Encounter (Signed)
So Colletta [place]- was this osamu olguin the father or nicolaas savo the son that needs to be seen? Both are my patients so im not 100% sure- are we seeing Tedric born in 1957? If not this message should be under father's chart

## 2018-07-03 NOTE — Telephone Encounter (Signed)
Noted  

## 2018-07-03 NOTE — Telephone Encounter (Signed)
Marc Ponce can you please schedule pt for 420 today?  Thanks in advance

## 2018-07-03 NOTE — Telephone Encounter (Signed)
Patient is scheduled   

## 2018-07-17 ENCOUNTER — Ambulatory Visit (INDEPENDENT_AMBULATORY_CARE_PROVIDER_SITE_OTHER): Payer: BLUE CROSS/BLUE SHIELD

## 2018-07-17 ENCOUNTER — Other Ambulatory Visit: Payer: Self-pay

## 2018-07-17 DIAGNOSIS — Z23 Encounter for immunization: Secondary | ICD-10-CM | POA: Diagnosis not present

## 2018-10-14 ENCOUNTER — Encounter: Payer: Self-pay | Admitting: Family Medicine

## 2018-10-15 ENCOUNTER — Encounter: Payer: Self-pay | Admitting: Family Medicine

## 2018-10-15 ENCOUNTER — Ambulatory Visit (INDEPENDENT_AMBULATORY_CARE_PROVIDER_SITE_OTHER): Payer: BC Managed Care – PPO | Admitting: Family Medicine

## 2018-10-15 VITALS — BP 162/80 | HR 81 | Temp 98.6°F | Ht 67.0 in | Wt 234.0 lb

## 2018-10-15 DIAGNOSIS — R945 Abnormal results of liver function studies: Secondary | ICD-10-CM

## 2018-10-15 DIAGNOSIS — K7581 Nonalcoholic steatohepatitis (NASH): Secondary | ICD-10-CM

## 2018-10-15 DIAGNOSIS — E782 Mixed hyperlipidemia: Secondary | ICD-10-CM | POA: Diagnosis not present

## 2018-10-15 DIAGNOSIS — I10 Essential (primary) hypertension: Secondary | ICD-10-CM

## 2018-10-15 DIAGNOSIS — R7989 Other specified abnormal findings of blood chemistry: Secondary | ICD-10-CM

## 2018-10-15 DIAGNOSIS — F1010 Alcohol abuse, uncomplicated: Secondary | ICD-10-CM

## 2018-10-15 DIAGNOSIS — Z1211 Encounter for screening for malignant neoplasm of colon: Secondary | ICD-10-CM

## 2018-10-15 NOTE — Assessment & Plan Note (Signed)
S: controlled on losartan hydrochlorothiazide 100-12.5 mg and metoprolol 50 mg extended release in the past-recently home readings have been in the 150s or 160s.   Feels like exercise down, stress up, weight up. Had been high salt diet through covid 19. States does get some mild ankle swelling- none at the moment BP Readings from Last 3 Encounters:  10/15/18 (!) 162/80  04/16/18 128/78  12/26/17 130/76  A/P:  Patient still has some old hctz 12.27m and we will combine that with losartan hctz 100-12.5 mg and continue metoprolol 560mXR then recheck in 1 month- work on increasing exercise, decreasing salt, working to get weight back down.

## 2018-10-15 NOTE — Progress Notes (Signed)
Phone [phone]   Subjective:  Virtual visit via Video note. Chief complaint: Chief Complaint  Patient presents with  . Follow-up  . Hypertension    BP readings have been in the 150s/60s mostly. Occasionally they will be more elevated. Denies HA, dizziness, visual changes, CP, SOB.   . Gastroesophageal Reflux    Taking Pevacid with good relief.   . Hyperlipidemia   This visit type was conducted due to national recommendations for restrictions regarding the COVID-19 Pandemic (e.g. social distancing).  This format is felt to be most appropriate for this patient at this time balancing risks to patient and risks to population by having him in for in person visit.  No physical exam was performed (except for noted visual exam or audio findings with Telehealth visits).    Our team/I connected with Hannah Beat at  8:00 AM EDT by a video enabled telemedicine application (doxy.me or caregility through epic) and verified that I am speaking with the correct person using two identifiers.  Location patient: Home[facility] Location provider: [facility], office Persons participating in the virtual visit:  patient  Our team/I discussed the limitations of evaluation and management by telemedicine and the availability of in person appointments. In light of current covid-19 pandemic, patient also understands that we are trying to protect them by minimizing in office contact if at all possible.  The patient expressed consent for telemedicine visit and agreed to proceed. Patient understands insurance will be billed.   ROS- No chest pain or shortness of breath. No headache or blurry vision.  No fever, chills, no new cough (likely related to losartan), shortness of breath, body aches, sore throat, or loss of taste or smell   Past Medical History-  Patient Active Problem List   Diagnosis Date Noted  . Morbid obesity ([place]) 07/10/2013    Priority: High  . Alcohol abuse, daily use 04/19/2011    Priority:  High  . GERD (gastroesophageal reflux disease) 12/26/2017    Priority: Medium  . Elevated LFTs 04/21/2017    Priority: Medium  . HTN (hypertension) 03/12/2013    Priority: Medium  . Hyperlipidemia 03/12/2013    Priority: Medium  . Steatohepatitis 04/19/2011    Priority: Medium  . History of cellulitis 12/26/2017    Priority: Low  . History of arthroplasty of left knee 05/26/2017    Priority: Low  . Pain in left knee 05/10/2017    Priority: Low    Medications- reviewed and updated Current Outpatient Medications  Medication Sig Dispense Refill  . lansoprazole (PREVACID) 30 MG capsule Take 30 mg by mouth daily at 12 noon.    Marland Kitchen losartan-hydrochlorothiazide (HYZAAR) 100-12.5 MG tablet Take 1 tablet by mouth daily. 90 tablet 3  . metoprolol succinate (TOPROL-XL) 50 MG 24 hr tablet TAKE 1 TABLET (50 MG TOTAL) BY MOUTH DAILY. TAKE WITH OR IMMEDIATELY FOLLOWING A MEAL. 90 tablet 3   No current facility-administered medications for this visit.      Objective:  BP (!) 162/80   Pulse 81   Temp 98.6 F (37 C)   Ht 5' 7"  (1.702 m)   Wt 234 lb (106.1 kg)   BMI 36.65 kg/m  self reported vitals Gen: NAD, resting comfortably Lungs: nonlabored, normal respiratory rate  Skin: appears dry, no obvious rash    Assessment and Plan   #hypertension S: controlled on losartan hydrochlorothiazide 100-12.5 mg and metoprolol 50 mg extended release in the past-recently home readings have been in the 150s or 160s. Dry cough  on losartan but not new - not as bad with ace-I in past. Also has some allergies and runny nose intermittently  Feels like exercise down, stress up, weight up. Had been high salt diet through covid 19. States does get some mild ankle swelling- none at the moment BP Readings from Last 3 Encounters:  10/15/18 (!) 162/80  04/16/18 128/78  12/26/17 130/76  A/P:  Patient still has some old hctz 12.44m and we will combine that with losartan hctz 100-12.5 mg and continue metoprolol  598mXR then recheck in 1 month- work on increasing exercise, decreasing salt, working to get weight back down.   #hyperlipidemia S: Poorly controlled on no statin with LDL over 100 and triglycerides over 200-cardiology had stopped atorvastatin in the past due to LFT elevations-patient also had increased risk of diabetes and we have been holding off and trying to work on healthy eating and regular exercise Lab Results  Component Value Date   CHOL 199 04/16/2018   HDL 40.90 04/16/2018   LDLCALC 79 04/15/2017   LDLDIRECT 119.0 04/16/2018   TRIG 225.0 (H) 04/16/2018   CHOLHDL 5 04/16/2018   A/P: discussed holding off on statin and focus on weight loss.    # Obesity morbid S: BMI remains under 35 with hypertension, hyperlipidemia, osteoarthritis, steatohepatitis.  Alcohol use makes weight loss more difficult- he  Is back up to 3 to 6 glasses of wine a day with recent stressors.  We have discussed AA in the past and again today.  Wt Readings from Last 3 Encounters:  10/15/18 234 lb (106.1 kg)  04/16/18 231 lb 9.6 oz (105.1 kg)  12/26/17 233 lb 12.8 oz (106.1 kg)  A/P: poor control of weight- trending up as well as alcohol use. Discussed AA and Encouraged need for healthy eating, regular exercise, weight loss.   #Steatohepatitis/alcohol dependence S: This was noted in 2012-patient drinks alcohol in excess.  LFTs were trending down on last labs.  Unfortunately alcohol back up to 3-6 glasses of wine per day A/P: discussed importance of cutting  Out alcohol- he will come by for cmp  # labs- didn't come by for a1c, psa as planned. We will add cmp  #Social update-patient still thankful and particularly thankful with covid-19 that he does not have to travel as much- but on other hand he is trying to be pushed out with a bad earlier retirement program (has been there over 30 years). Wife [place] business in [place] forced to take 68 days off with sounds like many unpaid  days. Dad moved into senior apartment which has been good for him and reduced costs and prepping his old home for sale.   - may have to voluntarily leave his job to preserve retirement package  Other notes: 1.Last visit patient deferred as was having change in position at work- today- willing to do GI referral  2.  Remains on Prevacid for GERD- need B12 level to be considered for future labs  6-84-monthysical advised Lab/Order associations:   ICD-10-CM   1. Essential hypertension  I10   2. Mixed hyperlipidemia  E78.2 Comprehensive metabolic panel  3. Morbid obesity ([place]   4. Elevated LFTs  R94.5   5. Alcohol abuse, daily use  F10.10   6. Steatohepatitis  K75.81 Comprehensive metabolic panel  7. Screening for colon cancer  Z12.11 Ambulatory referral to Gastroenterology   Return precautions advised.  SteGarret ReddishD

## 2018-10-15 NOTE — Patient Instructions (Signed)
Health Maintenance Due  Topic Date Due  . COLONOSCOPY - referral placed 05/02/2017   Depression screen [facility] 2/9 10/15/2018 04/16/2018 12/26/2017  Decreased Interest 1 0 0  Down, Depressed, Hopeless 1 0 0  PHQ - 2 Score 2 0 0  Altered sleeping 2 - -  Tired, decreased energy 2 - -  Change in appetite 1 - -  Feeling bad or failure about yourself  0 - -  Trouble concentrating 1 - -  Moving slowly or fidgety/restless 0 - -  Suicidal thoughts 0 - -  PHQ-9 Score 8 - -

## 2018-10-18 ENCOUNTER — Encounter: Payer: Self-pay | Admitting: Family Medicine

## 2018-10-18 MED ORDER — HYDROCHLOROTHIAZIDE 12.5 MG PO TABS: 12.5000 mg | ORAL_TABLET | Freq: Every day | ORAL | 0 refills | Status: DC

## 2018-10-18 NOTE — Telephone Encounter (Signed)
Per OV note 10/15/18 -  Patient still has some old hctz 12.54m and we will combine that with losartan hctz 100-12.5 mg and continue metoprolol 528mXR then recheck in 1 month- work on increasing exercise, decreasing salt, working to get weight back down.

## 2018-11-13 ENCOUNTER — Telehealth: Payer: Self-pay | Admitting: Gastroenterology

## 2018-11-13 NOTE — Telephone Encounter (Signed)
Dr. Ardis Hughs DOD 6.29.20  There is a referral for pt to have a colonoscopy.  Previous colon and path reports from 2011 at [facility] will be sent to you for review.

## 2018-11-14 ENCOUNTER — Other Ambulatory Visit: Payer: Self-pay | Admitting: Family Medicine

## 2018-12-24 ENCOUNTER — Encounter: Payer: Self-pay | Admitting: Family Medicine

## 2019-03-14 ENCOUNTER — Telehealth: Payer: Self-pay | Admitting: Gastroenterology

## 2019-03-14 ENCOUNTER — Encounter: Payer: Self-pay | Admitting: Family Medicine

## 2019-03-14 NOTE — Telephone Encounter (Signed)
Patient got records reviewed by Dr. Ardis Hughs and referral sent over for a colonoscopy. Dr. Ardis Hughs said it was okay to schedule colon. Spoke with patient and he is now having issues with hemorrhoids. He said that for the past three weeks he has had loose stools and some bright red blood. A little bit of discomfort. Right now he is not bleeding but is still has discomfort and has tried treating the past three weeks but is not going away. Dr. Ardis Hughs does not have any more new appointments for the rest of the year and when records are reviewed we are only supposed to schedule patient with that doctor only. Patient is asking if there is anything he can do for discomfort and I was not sure what to do to help him.

## 2019-03-14 NOTE — Telephone Encounter (Signed)
Tried to call pt but phone rang then hung up.  We can not offer any advise to the pt.  He has not been seen in our office.  I have sent a My Chart message to the pt that he should contact his PCP for recommendations until he is seen.

## 2019-03-15 MED ORDER — HYDROCORTISONE ACETATE 25 MG RE SUPP: 25.0000 mg | Freq: Two times a day (BID) | RECTAL | 0 refills | Status: DC

## 2019-04-14 ENCOUNTER — Telehealth: Payer: BC Managed Care – PPO | Admitting: Physician Assistant

## 2019-04-14 DIAGNOSIS — Z20828 Contact with and (suspected) exposure to other viral communicable diseases: Secondary | ICD-10-CM

## 2019-04-14 DIAGNOSIS — Z20822 Contact with and (suspected) exposure to covid-19: Secondary | ICD-10-CM

## 2019-04-14 NOTE — Progress Notes (Signed)
I have spent 5 minutes in review of e-visit questionnaire, review and updating patient chart, medical decision making and response to patient.   Boluwatife Mutchler Cody Chenise Mulvihill, PA-C    

## 2019-04-14 NOTE — Progress Notes (Signed)
E-Visit for [employer] Virus Screening   Your questionnaire indicated close exposure to COVID. It is reasonable (not just for work) to be tested. Many health care providers can now test patients at their office but not all are.  [facility] has multiple testing sites. For information on our COVID testing locations and hours go to [email or web address]  No appointments currently needed. Appointment scheduling begins Dec 15th.   Testing Information: The COVID-19 Community Testing sites will begin testing BY APPOINTMENT ONLY starting the week of December 14th (see go-live dates by location below).  You can begin scheduling online 04/12/2019 at [email or web address]. If you do not have access to a smart phone or computer you may call [phone] . In addition, starting the week of December 14th we will move INDOORS for testing (see locations below). . Community testing site appointment hours will be 8 a.m. to 3:30 p.m.   Testing Locations: [place] will begin Tuesday December 15th. Closed on Monday December 14th to relocate indoors to [address] [place] [address] [facility] Appointments will begin Wednesday December 16th. Closed on Tuesday December 15th for move to indoors at [place]. [address] [place], [place] [address] [facility] Appointments will begin Wednesday December 16th. Closed on Tuesday December 15th for move to indoors at [address] [place] [address]   We are enrolling you in our [place] for [place] . Daily you will receive a questionnaire within the [place] website. Our COVID 19 response team will be monitoring your responses daily.  Please quarantine yourself while awaiting your test results. If you develop fever/cough/breathlessness, please stay home for 10 days with improving symptoms and until you have had 24 hours of no fever (without taking a fever reducer).  You should wear a mask or cloth face covering over your  nose and mouth if you must be around other people or animals, including pets (even at home). Try to stay at least 6 feet away from other people. This will protect the people around you.  Please continue good preventive care measures, including:  frequent hand-washing, avoid touching your face, cover coughs/sneezes, stay out of crowds and keep a 6 foot distance from others.  COVID-19 is a respiratory illness with symptoms that are similar to the flu. Symptoms are typically mild to moderate, but there have been cases of severe illness and death due to the virus.   The following symptoms may appear 2-14 days after exposure: . Fever . Cough . Shortness of breath or difficulty breathing . Chills . Repeated shaking with chills . Muscle pain . Headache . Sore throat . New loss of taste or smell . Fatigue . Congestion or runny nose . Nausea or vomiting . Diarrhea  Go to the nearest hospital ED for assessment if fever/cough/breathlessness are severe or illness seems like a threat to life.  It is vitally important that if you feel that you have an infection such as this virus or any other virus that you stay home and away from places where you may spread it to others.  You should avoid contact with people age 49 and older.    You may also take acetaminophen (Tylenol) as needed for fever.  Reduce your risk of any infection by using the same precautions used for avoiding the common cold or flu:  Marland Kitchen Wash your hands often with soap and warm water for at least 20 seconds.  If soap and water are not readily available, use an alcohol-based  hand sanitizer with at least 60% alcohol.  . If coughing or sneezing, cover your mouth and nose by coughing or sneezing into the elbow areas of your shirt or coat, into a tissue or into your sleeve (not your hands). . Avoid shaking hands with others and consider head nods or verbal greetings only. . Avoid touching your eyes, nose, or mouth with unwashed hands.  . Avoid  close contact with people who are sick. . Avoid places or events with large numbers of people in one location, like concerts or sporting events. . Carefully consider travel plans you have or are making. . If you are planning any travel outside or inside the [place], visit the CDC's Travelers' Health webpage for the latest health notices. . If you have some symptoms but not all symptoms, continue to monitor at home and seek medical attention if your symptoms worsen. . If you are having a medical emergency, call 911.  HOME CARE . Only take medications as instructed by your medical team. . Drink plenty of fluids and get plenty of rest. . A steam or ultrasonic humidifier can help if you have congestion.   GET HELP RIGHT AWAY IF YOU HAVE EMERGENCY WARNING SIGNS** FOR COVID-19. If you or someone is showing any of these signs seek emergency medical care immediately. Call 911 or proceed to your closest emergency facility if: . You develop worsening high fever. . Trouble breathing . Bluish lips or face . Persistent pain or pressure in the chest . New confusion . Inability to wake or stay awake . You cough up blood. . Your symptoms become more severe  **This list is not all possible symptoms. Contact your medical provider for any symptoms that are sever or concerning to you.  MAKE SURE YOU   Understand these instructions.  Will watch your condition.  Will get help right away if you are not doing well or get worse.  Your e-visit answers were reviewed by a board certified advanced clinical practitioner to complete your personal care plan.  Depending on the condition, your plan could have included both over the counter or prescription medications.  If there is a problem please reply once you have received a response from your provider.  Your safety is important to [place].  If you have drug allergies check your prescription carefully.    You can use MyChart to ask questions about today's visit, request  a non-urgent call back, or ask for a work or school excuse for 24 hours related to this e-Visit. If it has been greater than 24 hours you will need to follow up with your provider, or enter a new e-Visit to address those concerns. You will get an e-mail in the next two days asking about your experience.  I hope that your e-visit has been valuable and will speed your recovery. Thank you for using e-visits.

## 2019-04-16 DIAGNOSIS — Z03818 Encounter for observation for suspected exposure to other biological agents ruled out: Secondary | ICD-10-CM | POA: Diagnosis not present

## 2019-04-16 DIAGNOSIS — Z20828 Contact with and (suspected) exposure to other viral communicable diseases: Secondary | ICD-10-CM | POA: Diagnosis not present

## 2019-04-29 ENCOUNTER — Other Ambulatory Visit: Payer: Self-pay

## 2019-04-29 ENCOUNTER — Telehealth (INDEPENDENT_AMBULATORY_CARE_PROVIDER_SITE_OTHER): Payer: BC Managed Care – PPO | Admitting: Family Medicine

## 2019-04-29 DIAGNOSIS — L03115 Cellulitis of right lower limb: Secondary | ICD-10-CM

## 2019-04-29 DIAGNOSIS — R6889 Other general symptoms and signs: Secondary | ICD-10-CM

## 2019-04-29 MED ORDER — CEPHALEXIN 500 MG PO CAPS: 500.0000 mg | ORAL_CAPSULE | Freq: Three times a day (TID) | ORAL | 0 refills | Status: AC

## 2019-04-29 NOTE — Progress Notes (Signed)
Virtual Visit via Video Note  I connected with the patient on 04/29/19 at  2:45 PM EST by a video enabled telemedicine application and verified that I am speaking with the correct person using two identifiers.  Location patient: home Location provider:work or home office Persons participating in the virtual visit: patient, provider  I discussed the limitations of evaluation and management by telemedicine and the availability of in person appointments. The patient expressed understanding and agreed to proceed.   HPI: Here for several issues. First he says he developed chills and a fever up to 102 degrees last night, and that after taking Tylenol this has come down to 99.2 degrees today. He also has body aches, a headache, and diarrhea. No cough or chest pain or SOB. He has already made plans to get a Covid-19 test at the [facility] in [facility] tomorrow morning. His wife (who has no symptoms) will go with him to be tested as well. He is drinking fluids. In addition to these symptoms, he thinks he has developed another case of cellulitis on the lower right leg. He has had this several times in the past. Yesterday he noticed a patch of skin on the right shin had turned red and was warm to the touch, and today is has become tender.   ROS: See pertinent positives and negatives per HPI.  Past Medical History:  Diagnosis Date  . Allergy   . Arthritis   . Diverticulitis   . GERD (gastroesophageal reflux disease)   . History of chicken pox   . Hypertension   . Peri-rectal abscess   . Rectal pain     Past Surgical History:  Procedure Laterality Date  . arthoscopic surgery on Left Knee  04/10/2014  . FRACTURE SURGERY     left shoulder x 6 yrs ago and left x 42 yrs ago femur -knee cap  . 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 plate insertion  5170, 2011   humerus 1977, femur 2011  . REPLACEMENT TOTAL KNEE  Left 2019   [facility]  . SHOULDER SURGERY  01/12/2010   Left     Family History  Problem Relation Age of Onset  . Dementia Mother   . Arthritis Mother   . Heart disease Father 106       MI   . Hypertension Father   . Hyperlipidemia Father   . COPD Father   . Diabetes Father   . Hearing loss Father   . Heart attack Father   . Kidney disease Father   . Heart disease Maternal Grandmother   . Arthritis Maternal Grandmother   . Heart disease Maternal Grandfather   . Lung cancer Maternal Grandfather   . Heart disease Paternal Grandmother   . Stroke Paternal Grandmother   . Leukemia Paternal Grandfather      Current Outpatient Medications:  .  cephALEXin (KEFLEX) 500 MG capsule, Take 1 capsule (500 mg total) by mouth 3 (three) times daily for 10 days., Disp: 30 capsule, Rfl: 0 .  hydrochlorothiazide (HYDRODIURIL) 12.5 MG tablet, TAKE 1 TABLET BY MOUTH EVERY DAY, Disp: 90 tablet, Rfl: 1 .  hydrocortisone (ANUSOL-HC) 25 MG suppository, Place 1 suppository (25 mg total) rectally 2 (two) times daily., Disp: 12 suppository, Rfl: 0 .  lansoprazole (PREVACID) 30 MG capsule, Take 30 mg by mouth daily at 12 noon., Disp: , Rfl:  .  losartan-hydrochlorothiazide (HYZAAR) 100-12.5 MG tablet, Take 1 tablet by mouth daily., Disp: 90 tablet, Rfl: 3 .  metoprolol succinate (TOPROL-XL) 50 MG 24 hr tablet, TAKE 1 TABLET (50 MG TOTAL) BY MOUTH DAILY. TAKE WITH OR IMMEDIATELY FOLLOWING A MEAL., Disp: 90 tablet, Rfl: 3  EXAM:  VITALS per patient if applicable:  GENERAL: alert, oriented, appears well and in no acute distress  HEENT: atraumatic, conjunttiva clear, no obvious abnormalities on inspection of external nose and ears  NECK: normal movements of the head and neck  LUNGS: on inspection no signs of respiratory distress, breathing rate appears normal, no obvious gross SOB, gasping or wheezing  CV: no obvious cyanosis  MS: moves all visible extremities without noticeable  abnormality  PSYCH/NEURO: pleasant and cooperative, no obvious depression or anxiety, speech and thought processing grossly intact  ASSESSMENT AND PLAN: For the flu like symptoms, I think it is very likely that he does have a Covid-19 infection. He will go to be tested tomorrow, and he will continue to drink fluids and take Tylenol in the meantime. I advised him to quarantine himself at home, and he agrees. On top of that he seems to have an early cellulitis so we will treat this with Keflex.  Alysia Penna, MD  Discussed the following assessment and plan:  No diagnosis found.     I discussed the assessment and treatment plan with the patient. The patient was provided an opportunity to ask questions and all were answered. The patient agreed with the plan and demonstrated an understanding of the instructions.   The patient was advised to call back or seek an in-person evaluation if the symptoms worsen or if the condition fails to improve as anticipated.

## 2019-04-30 DIAGNOSIS — Z20828 Contact with and (suspected) exposure to other viral communicable diseases: Secondary | ICD-10-CM | POA: Diagnosis not present

## 2019-05-03 ENCOUNTER — Encounter: Payer: Self-pay | Admitting: Family Medicine

## 2019-05-06 NOTE — Telephone Encounter (Signed)
I am glad to hear his Covid test was negative. He could have had the flu, but I have not heard of any flu in our community yet. At this point it would be too late to test for flu. His symptoms ocould have been from the cellulitis in his leg as well. As for a flu shot, yes it would be a good idea for him to get one. Even if he did have a strain of flu, the shot covers 4 strains so he could be protected from getting any of the other strains

## 2019-06-04 ENCOUNTER — Other Ambulatory Visit: Payer: Self-pay | Admitting: Family Medicine

## 2019-08-15 ENCOUNTER — Other Ambulatory Visit: Payer: Self-pay

## 2019-08-15 ENCOUNTER — Encounter: Payer: Self-pay | Admitting: Family Medicine

## 2019-08-15 ENCOUNTER — Ambulatory Visit (INDEPENDENT_AMBULATORY_CARE_PROVIDER_SITE_OTHER): Payer: BC Managed Care – PPO | Admitting: Family Medicine

## 2019-08-15 VITALS — BP 136/64 | HR 82 | Temp 98.3°F | Ht 67.0 in | Wt 228.8 lb

## 2019-08-15 DIAGNOSIS — E782 Mixed hyperlipidemia: Secondary | ICD-10-CM | POA: Diagnosis not present

## 2019-08-15 DIAGNOSIS — Z125 Encounter for screening for malignant neoplasm of prostate: Secondary | ICD-10-CM | POA: Diagnosis not present

## 2019-08-15 DIAGNOSIS — R739 Hyperglycemia, unspecified: Secondary | ICD-10-CM | POA: Diagnosis not present

## 2019-08-15 DIAGNOSIS — Z0000 Encounter for general adult medical examination without abnormal findings: Secondary | ICD-10-CM | POA: Diagnosis not present

## 2019-08-15 DIAGNOSIS — Z79899 Other long term (current) drug therapy: Secondary | ICD-10-CM

## 2019-08-15 DIAGNOSIS — I10 Essential (primary) hypertension: Secondary | ICD-10-CM | POA: Diagnosis not present

## 2019-08-15 DIAGNOSIS — K219 Gastro-esophageal reflux disease without esophagitis: Secondary | ICD-10-CM

## 2019-08-15 DIAGNOSIS — K7581 Nonalcoholic steatohepatitis (NASH): Secondary | ICD-10-CM

## 2019-08-15 DIAGNOSIS — Z1283 Encounter for screening for malignant neoplasm of skin: Secondary | ICD-10-CM

## 2019-08-15 DIAGNOSIS — R7989 Other specified abnormal findings of blood chemistry: Secondary | ICD-10-CM

## 2019-08-15 DIAGNOSIS — F1010 Alcohol abuse, uncomplicated: Secondary | ICD-10-CM

## 2019-08-15 DIAGNOSIS — Z1211 Encounter for screening for malignant neoplasm of colon: Secondary | ICD-10-CM

## 2019-08-15 MED ORDER — LOSARTAN POTASSIUM-HCTZ 100-12.5 MG PO TABS: 1.0000 | ORAL_TABLET | Freq: Every day | ORAL | 3 refills | Status: DC

## 2019-08-15 MED ORDER — METOPROLOL SUCCINATE ER 50 MG PO TB24: ORAL_TABLET | ORAL | 3 refills | Status: DC
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
## 2019-08-15 NOTE — Patient Instructions (Addendum)
Health Maintenance Due  Topic Date Due  . COLONOSCOPY - We will call you within two weeks about your referral to GI for this. If you do not hear within 3 weeks, give [place] a call.   05/02/2017   I like your idea of working on a healthy lunch and healthier snacks.  I would love to see your alcohol at 2 or less per day-cutting further than that may also significantly help with weight loss.  As always AA is a great resource for helping people cut down alcohol.  Could try xyzal or claritin before bed to see if that helps with overnight congestion. Let me know if not improving- can refer to ENT  We discussed trial of Pepcid with better long-term safety profile for reflux over prevacid.  We will check B12 level given long-term PPI use -hold off on trying this until after you have tried claritin/xyzal  Please stop by lab before you go If you do not have mychart- we will call you about results within 5 business days of [place] receiving them.  If you have mychart- we will send your results within 3 business days of [place] receiving them.  If abnormal or we want to clarify a result, we will call or mychart you to make sure you receive the message.  If you have questions or concerns or don't hear within 5 business days, please send [place] a message or call us.   Recommended follow up: Return in about 6 months (around 02/14/2020) for follow up- or sooner if needed.

## 2019-08-16 ENCOUNTER — Other Ambulatory Visit: Payer: Self-pay

## 2019-08-16 DIAGNOSIS — R7989 Other specified abnormal findings of blood chemistry: Secondary | ICD-10-CM

## 2019-08-16 LAB — COMPREHENSIVE METABOLIC PANEL
ALT: 38 U/L (ref 0–53)
AST: 77 U/L — ABNORMAL HIGH (ref 0–37)
Albumin: 3.5 g/dL (ref 3.5–5.2)
Alkaline Phosphatase: 124 U/L — ABNORMAL HIGH (ref 39–117)
BUN: 20 mg/dL (ref 6–23)
CO2: 24 mEq/L (ref 19–32)
Calcium: 9 mg/dL (ref 8.4–10.5)
Chloride: 102 mEq/L (ref 96–112)
Creatinine, Ser: 1.11 mg/dL (ref 0.40–1.50)
GFR: 67.01 mL/min (ref >60.00)
Glucose, Bld: 117 mg/dL — ABNORMAL HIGH (ref 70–99)
Potassium: 4.3 mEq/L (ref 3.5–5.1)
Sodium: 135 mEq/L (ref 135–145)
Total Bilirubin: 2.8 mg/dL — ABNORMAL HIGH (ref 0.2–1.2)
Total Protein: 7.3 g/dL (ref 6.0–8.3)

## 2019-08-16 LAB — CBC WITH DIFFERENTIAL/PLATELET
Basophils Absolute: 0.1 10*3/uL (ref 0.0–0.1)
Basophils Relative: 1.2 % (ref 0.0–3.0)
Eosinophils Absolute: 0.2 10*3/uL (ref 0.0–0.7)
Eosinophils Relative: 2.4 % (ref 0.0–5.0)
HCT: 38.2 % — ABNORMAL LOW (ref 39.0–52.0)
Hemoglobin: 12.9 g/dL — ABNORMAL LOW (ref 13.0–17.0)
Lymphocytes Relative: 17.4 % (ref 12.0–46.0)
Lymphs Abs: 1.2 10*3/uL (ref 0.7–4.0)
MCHC: 33.8 g/dL (ref 30.0–36.0)
MCV: 99.7 fl (ref 78.0–100.0)
Monocytes Absolute: 0.8 10*3/uL (ref 0.1–1.0)
Monocytes Relative: 11.6 % (ref 3.0–12.0)
Neutro Abs: 4.5 10*3/uL (ref 1.4–7.7)
Neutrophils Relative %: 67.4 % (ref 43.0–77.0)
Platelets: 128 10*3/uL — ABNORMAL LOW (ref 150.0–400.0)
RBC: 3.83 Mil/uL — ABNORMAL LOW (ref 4.22–5.81)
RDW: 16.8 % — ABNORMAL HIGH (ref 11.5–15.5)
WBC: 6.6 10*3/uL (ref 4.0–10.5)

## 2019-08-16 LAB — LIPID PANEL
Cholesterol: 224 mg/dL — ABNORMAL HIGH (ref 0–200)
HDL: 35.5 mg/dL — ABNORMAL LOW (ref >39.00)
NonHDL: 188.76
Total CHOL/HDL Ratio: 6
Triglycerides: 277 mg/dL — ABNORMAL HIGH (ref 0.0–149.0)
VLDL: 55.4 mg/dL — ABNORMAL HIGH (ref 0.0–40.0)

## 2019-08-16 LAB — VITAMIN B12: Vitamin B-12: 806 pg/mL (ref 211–911)

## 2019-08-16 LAB — HEMOGLOBIN A1C: Hgb A1c MFr Bld: 6 % (ref 4.6–6.5)

## 2019-08-16 LAB — PSA: PSA: 0.2 ng/mL (ref 0.10–4.00)

## 2019-08-16 LAB — LDL CHOLESTEROL, DIRECT: Direct LDL: 123 mg/dL

## 2019-08-19 ENCOUNTER — Other Ambulatory Visit: Payer: Self-pay | Admitting: Family Medicine

## 2019-08-19 DIAGNOSIS — R7989 Other specified abnormal findings of blood chemistry: Secondary | ICD-10-CM

## 2019-08-21 ENCOUNTER — Other Ambulatory Visit: Payer: Self-pay

## 2019-08-21 DIAGNOSIS — R7989 Other specified abnormal findings of blood chemistry: Secondary | ICD-10-CM

## 2019-09-24 ENCOUNTER — Other Ambulatory Visit: Payer: BC Managed Care – PPO

## 2019-11-26 ENCOUNTER — Other Ambulatory Visit: Payer: Self-pay

## 2019-11-26 ENCOUNTER — Encounter: Payer: Self-pay | Admitting: Dermatology

## 2019-11-26 ENCOUNTER — Ambulatory Visit (INDEPENDENT_AMBULATORY_CARE_PROVIDER_SITE_OTHER): Payer: BC Managed Care – PPO | Admitting: Dermatology

## 2019-11-26 DIAGNOSIS — D239 Other benign neoplasm of skin, unspecified: Secondary | ICD-10-CM

## 2019-11-26 DIAGNOSIS — L570 Actinic keratosis: Secondary | ICD-10-CM

## 2019-11-26 DIAGNOSIS — D234 Other benign neoplasm of skin of scalp and neck: Secondary | ICD-10-CM

## 2019-11-26 DIAGNOSIS — L918 Other hypertrophic disorders of the skin: Secondary | ICD-10-CM

## 2019-11-26 DIAGNOSIS — L300 Nummular dermatitis: Secondary | ICD-10-CM | POA: Diagnosis not present

## 2019-11-26 MED ORDER — BETAMETHASONE DIPROPIONATE AUG 0.05 % EX CREA: TOPICAL_CREAM | Freq: Two times a day (BID) | CUTANEOUS | 2 refills | Status: DC

## 2019-11-26 MED ORDER — BETAMETHASONE DIPROPIONATE AUG 0.05 % EX CREA: TOPICAL_CREAM | CUTANEOUS | 2 refills | Status: DC | PRN

## 2019-11-26 NOTE — Patient Instructions (Addendum)
Nummular Eczema Nummular eczema is a common disease that causes red, circular, crusted (plaque) lesions that may be itchy. It most commonly affects the lower legs and the backs of hands. Men tend to get their first outbreak between 24 and 63 years of age, and women tend to get their first outbreak during their teen or young adult years. What are the causes? The cause of this condition is not known. It may be related to certain skin sensitivities, such as sensitivity to:  Metals such as nickel and, rarely, mercury.  Formaldehyde.  Antibiotic medicine that is applied to the skin. What increases the risk? You are more likely to develop this condition if:  You have very dry skin.  You live in a place with dry and cold weather.  You have a personal or family history of eczema, asthma, or allergies.  You drink alcohol.  You have poor blood flow (circulation). What are the signs or symptoms? Symptoms most commonly affect the lower legs, but may also affect the hands, torso, arms, or feet. Symptoms include:  Groups of tiny, red spots.  Blister-like sores that leak fluid. These sores may grow together and form circular patches. After a long time, they may become crusty and then scaly.  Well-defined patches of pink, red, or brown skin.  Itchiness and burning, ranging from mild to severe. Itchiness may be worse at night and may cause trouble sleeping. Scratching lesions can cause bleeding. How is this diagnosed? This condition is diagnosed based on a physical exam and your medical history. Usually more tests are not needed, but you may need a swab test to check for skin infection. This test involves swabbing an affected area and testing the sample for bacteria (culture). You may work with a health care provider who specializes in the skin (dermatologist) to help diagnose and treat this condition. How is this treated? There is no cure for this condition, but treatment can help relieve  symptoms. Depending on how severe your symptoms are, your healthcare provider may suggest:  Medicine applied to the skin to reduce swelling and irritation (topical corticosteroids).  Medicine taken by mouth to reduce itching (oral antihistamines).  Antibiotic medicine to take by mouth (oral antibiotic) or to apply to your skin (topical antibiotic), if you have a skin infection.  Light therapy (phototherapy). This involves shining ultraviolet (UV) light on affected skin to reduce itchiness and inflammation.  Soaking in a bath that contains a type of salt that dries out blisters (potassium permanganate soaks). Follow these instructions at home: Medicines  Take over-the-counter and prescription medicines only as told by your health care provider.  If you were prescribed an antibiotic, take or apply it as told by your health care provider. Do not stop using the antibiotic even if you start to feel better. Skin Care   Keep your fingernails short to avoid breaking open the skin when you scratch.  Wash your hands with mild soap and water to avoid infection.  Pat your skin dry after bathing or washing your hands. Avoid rubbing your skin.  Keep your skin hydrated. To do this: ? Avoid very hot water. Take lukewarm baths or showers. ? Apply moisturizer within three minutes of bathing. This locks in moisture. ? Use a humidifier when you have the heating or air conditioning on. This will add moisture to the air.  Identify and avoid things that trigger symptoms or irritate your skin. Triggers may include taking long, hot showers or baths, or not using creams  or ointments to moisturize. Certain soaps may also trigger this condition. General instructions  Dress in clothes made of cotton or cotton blends. Avoid wearing clothes with wool fabric.  Avoid activities that may cause skin injury. Wear protective clothing when doing outdoor activities such as gardening or hiking. Cuts, scrapes, and insect  bites can make symptoms worse.  Keep all follow-up visits as told by your health care provider. This is important. Contact a health care provider if:  You develop a yellowish crust on an area of affected skin.  You have symptoms that do not go away with treatment or home care methods. Get help right away if:  You have more redness, pain, pus, or swelling. Summary  Nummular eczema is a common disease that causes red, circular, crusted (plaque) lesions that may be itchy.  The cause of this condition is not known. It may be related to certain skin sensitivities.  Treatments may include medicines to reduce swelling and irritation, avoiding triggers, and keeping your skin hydrated. This information is not intended to replace advice given to you by your health care provider. Make sure you discuss any questions you have with your health care provider. Document Revised: 06/14/2018 Document Reviewed: 09/01/2016 Elsevier Patient Education  [place]. Multiple issues discussed today with Marc Ponce date of birth [birth year].  We discussed Marc Ponce history of fairly typical cellulitis after knee surgery on the left leg and 2 bouts of inflammatory possible pseudocellulitis on the right leg. He is advised to keep Marc Ponce skin hydrated and if he does get itching after being in the grass he may use the prescription medication; hopefully this will minimize recurrence.  On the right lower hip is a 1 cm eczematous patch which fits nummular eczema.  Prescription phoned in for betamethasone diprionate which he can use daily after bathing when itchy spots appear.  He should not use this on the face.  General skin examination showed no atypical moles or skin cancer.  On the left parietal area is a 4 mm uniform gray spot which is amorphous with dermoscopy and fits a benign blue nevus.  OnW the left dorsal hand and the left temple are flat tan spots that show solar lentigo with dermoscopy and can be left if stable.   On the right temple is a pink crust that fits a precancer-solar keratosis which was treated with 5 seconds freezing.  We'll meet in 6 months to discuss any other options for sun damage and he knows he can call me with any questions before then.  The final skin issue that he brought up were a little white dots on Marc Ponce feet which are called acro keratosis and are also harmless.

## 2019-12-18 ENCOUNTER — Encounter: Payer: Self-pay | Admitting: Gastroenterology

## 2019-12-23 ENCOUNTER — Telehealth: Payer: Self-pay | Admitting: Gastroenterology

## 2019-12-23 NOTE — Telephone Encounter (Signed)
Spoke with patient, he reports having hemorrhoids due to a 700 mile car trip. Pt states that he has occasional bleeding - dripping in toilet. Advised patient to get Preparation H suppositories, discussed sitz baths, using wipes and patting the area dry vs vigorously rubbing the area. Advised that he should follow up with PCP if symptoms worsen due to limited availability at our office. Advised patient to call our office is he had any questions or concerns.

## 2020-01-04 ENCOUNTER — Encounter: Payer: Self-pay | Admitting: Dermatology

## 2020-01-04 NOTE — Progress Notes (Signed)
  NEW PATIENT VISIT   Subjective  Marc Ponce is a 64 y.o. male who presents for the following: New Patient (Initial Visit) (Pt stated--need skin check forehead/whole body.).  Multiple skin issues Location:  Duration:  Quality:  Associated Signs/Symptoms: Modifying Factors:  Severity:  Timing: Context:   Objective  Well appearing patient in no apparent distress; mood and affect are within normal limits.  A full examination was performed including scalp, head, eyes, ears, nose, lips, neck, chest, axillae, abdomen, back, buttocks, bilateral upper extremities, bilateral lower extremities, hands, feet, fingers, toes, fingernails, and toenails. All findings within normal limits unless otherwise noted below.   Assessment & Plan    Blue nevus Left Parietal Scalp  Leave if stable  Skin tag Left Anterior Neck  Nummular eczema Right Thigh - Anterior  Betamethasone diprionate daily after bathing for 1 month. Do not use on face.  Reordered Medications augmented betamethasone dipropionate (DIPROLENE-AF) 0.05 % cream  AK (actinic keratosis) Right Temple  Destruction of lesion - Right Temple Complexity: simple   Destruction method: cryotherapy   Informed consent: discussed and consent obtained   Timeout:  patient name, date of birth, surgical site, and procedure verified Lesion destroyed using liquid nitrogen: Yes   Region frozen until ice ball extended beyond lesion: Yes   Cryotherapy cycles:  5 Outcome: patient tolerated procedure well with no complications   Multiple issues discussed today with Marc Ponce date of birth 29-Oct-1955.  We discussed his history of fairly typical cellulitis after knee surgery on the left leg and 2 bouts of inflammatory possible pseudocellulitis on the right leg. He is advised to keep his skin hydrated and if he does get itching after being in the grass he may use the prescription medication; hopefully this will minimize recurrence.  On the  right lower hip is a 1 cm eczematous patch which fits nummular eczema.  Prescription phoned in for betamethasone diprionate which he can use daily after bathing when itchy spots appear.  He should not use this on the face.  General skin examination showed no atypical moles or skin cancer.  On the left parietal area is a 4 mm uniform gray spot which is amorphous with dermoscopy and fits a benign blue nevus.  OnW the left dorsal hand and the left temple are flat tan spots that show solar lentigo with dermoscopy and can be left if stable.  On the right temple is a pink crust that fits a precancer-solar keratosis which was treated with 5 seconds freezing.  We'll meet in 6 months to discuss any other options for sun damage and he knows he can call me with any questions before then.  The final skin issue that he brought up were a little white dots on his feet which are called acro keratosis and are also harmless.     I, Marc Monarch, MD, have reviewed all documentation for this visit.  The documentation on 01/04/20 for the exam, diagnosis, procedures, and orders are all accurate and complete.

## 2020-01-04 NOTE — Progress Notes (Signed)
   New Patient   Subjective  Marc Ponce is a 64 y.o. male who presents for the following: New Patient (Initial Visit) (Pt stated--need skin check forehead/whole body.).  Multiple skin issues Location:  Duration:  Quality:  Associated Signs/Symptoms: Modifying Factors:  Severity:  Timing: Context:    The following portions of the chart were reviewed this encounter and updated as appropriate: Tobacco  Allergies  Meds  Problems  Med Hx  Surg Hx  Fam Hx      Objective  Well appearing patient in no apparent distress; mood and affect are within normal limits.  A full examination was performed including scalp, head, eyes, ears, nose, lips, neck, chest, axillae, abdomen, back, buttocks, bilateral upper extremities, bilateral lower extremities, hands, feet, fingers, toes, fingernails, and toenails. All findings within normal limits unless otherwise noted below.   Assessment & Plan  Blue nevus Left Parietal Scalp  Leave if stable  Actinic keratosis Right Temporal Scalp  Skin tag Left Anterior Neck  Nummular eczema Right Thigh - Anterior  Reordered Medications augmented betamethasone dipropionate (DIPROLENE-AF) 0.05 % cream  Multiple issues discussed today with Duc Crocket date of birth [birth year].  We discussed his history of fairly typical cellulitis after knee surgery on the left leg and 2 bouts of inflammatory possible pseudocellulitis on the right leg if he will keep his skin hydrated and if he does get itching after being in the grass he may use the prescription medication hopefully this will minimize recurrence.  On the right lower hip is a 1 cm eczematous patch which fits nummular eczema.  Prescription phoned in for betamethasone debris and 8 which she can use daily after bathing when itchy spots appear.  He should not use this on the face.  General skin examination showed no atypical moles or skin cancer.  On the left parietal area is a 4 mm uniform gray spot  which is amorphous with dermoscopy and fits a benign blue nevus.  On the left dorsal hand and the left temple are flat tan spots that show solar lentigo with dermoscopy and can be left if stable.  On the right temple is a pink crust that fits a precancer-solar keratosis which was treated with 5 seconds freezing.  Will meet in 6 months to discuss any other options for sun damage and he knows he can call me with any questions before then.  The final skin issue that he brought up were a little white dots on his feet which are called acro keratosis and are also harmless.

## 2020-02-13 NOTE — Progress Notes (Deleted)
  Phone [phone] In person visit   Subjective:   Marc Ponce is a 64 y.o. year old very pleasant male patient who presents for/with See problem oriented charting No chief complaint on file.   This visit occurred during the SARS-CoV-2 public health emergency.  Safety protocols were in place, including screening questions prior to the visit, additional usage of staff PPE, and extensive cleaning of exam room while observing appropriate contact time as indicated for disinfecting solutions.   Past Medical History-  Patient Active Problem List   Diagnosis Date Noted  . GERD (gastroesophageal reflux disease) 12/26/2017  . History of cellulitis 12/26/2017  . History of arthroplasty of left knee 05/26/2017  . Pain in left knee 05/10/2017  . Elevated LFTs 04/21/2017  . Morbid obesity ([place]) 07/10/2013  . HTN (hypertension) 03/12/2013  . Hyperlipidemia 03/12/2013  . Steatohepatitis 04/19/2011  . Alcohol abuse, daily use 04/19/2011    Medications- reviewed and updated Current Outpatient Medications  Medication Sig Dispense Refill  . augmented betamethasone dipropionate (DIPROLENE-AF) 0.05 % cream Apply topically as needed. 50 g 2  . lansoprazole (PREVACID) 30 MG capsule Take 30 mg by mouth daily at 12 noon.    Marland Kitchen losartan-hydrochlorothiazide (HYZAAR) 100-12.5 MG tablet Take 1 tablet by mouth daily. 90 tablet 3  . metoprolol succinate (TOPROL-XL) 50 MG 24 hr tablet TAKE 1 TABLET (50 MG TOTAL) BY MOUTH DAILY. TAKE WITH OR IMMEDIATELY FOLLOWING A MEAL. 90 tablet 3  . Probiotic Product (PROBIOTIC DAILY PO) Take by mouth.     No current facility-administered medications for this visit.     Objective:  There were no vitals taken for this visit. Gen: NAD, resting comfortably CV: RRR no murmurs rubs or gallops Lungs: CTAB no crackles, wheeze, rhonchi Abdomen: soft/nontender/nondistended/normal bowel sounds. No rebound or guarding.  Ext: no edema Skin: warm, dry Neuro: grossly normal,  moves all extremities  ***    Assessment and Plan   #hypertension S: medication: metoprolol 50Mg Home readings #s: *** BP Readings from Last 3 Encounters:  08/15/19 136/64  10/15/18 (!) 162/80  04/16/18 128/78  A/P: ***  # GERD S:Medication: Prevacid 30Mg B12 levels related to PPI use: Lab Results  Component Value Date   VITAMINB12 806 08/15/2019   A/P: ***   #hyperlipidemia S: Medication:***  Lab Results  Component Value Date   CHOL 224 (H) 08/15/2019   HDL 35.50 (L) 08/15/2019   LDLCALC 79 04/15/2017   LDLDIRECT 123.0 08/15/2019   TRIG 277.0 (H) 08/15/2019   CHOLHDL 6 08/15/2019   A/P: ***   No specialty comments available.  No problem-specific Assessment & Plan notes found for this encounter.   Recommended follow up: ***No follow-ups on file. Future Appointments  Date Time Provider [place]  02/14/2020 10:00 AM [facility] RM [facility]  02/17/2020  1:40 PM [name], MD [facility]  02/28/2020  8:00 AM Milus Banister, MD [facility]  06/01/2020  9:30 AM Lavonna Monarch, MD [facility]    Lab/Order associations: No diagnosis found.  No orders of the defined types were placed in this encounter.   Time Spent: *** minutes of total time (9:08 PM***- 9:08 PM***) was spent on the date of the encounter performing the following actions: chart review prior to seeing the patient, obtaining history, performing a medically necessary exam, counseling on the treatment plan, placing orders, and documenting in our EHR.   Return precautions advised.  Clyde Lundborg, CMA

## 2020-02-14 ENCOUNTER — Ambulatory Visit (AMBULATORY_SURGERY_CENTER): Payer: Self-pay | Admitting: *Deleted

## 2020-02-14 ENCOUNTER — Other Ambulatory Visit: Payer: Self-pay

## 2020-02-14 VITALS — Ht 67.0 in | Wt 221.0 lb

## 2020-02-14 DIAGNOSIS — Z1211 Encounter for screening for malignant neoplasm of colon: Secondary | ICD-10-CM

## 2020-02-14 NOTE — Progress Notes (Signed)
No egg or soy allergy known to patient  No issues with past sedation with any surgeries or procedures no intubation problems in the past  No FH of Malignant Hyperthermia No diet pills per patient No home 02 use per patient  No blood thinners per patient  Pt denies issues with constipation  No A fib or A flutter  EMMI video to pt or via [place] 19 guidelines implemented in PV today with Pt and RN   Due to the COVID-19 pandemic we are asking patients to follow these guidelines. Please only bring one care partner. Please be aware that your care partner may wait in the car in the parking lot or if they feel like they will be too hot to wait in the car, they may wait in the lobby on the 4th floor. All care partners are required to wear a mask the entire time (we do not have any that we can provide them), they need to practice social distancing, and we will do a Covid check for all patient's and care partners when you arrive. Also we will check their temperature and your temperature. If the care partner waits in their car they need to stay in the parking lot the entire time and we will call them on their cell phone when the patient is ready for discharge so they can bring the car to the front of the building. Also all patient's will need to wear a mask into building.

## 2020-02-17 ENCOUNTER — Ambulatory Visit: Payer: BC Managed Care – PPO | Admitting: Family Medicine

## 2020-02-17 ENCOUNTER — Encounter: Payer: Self-pay | Admitting: Gastroenterology

## 2020-02-17 DIAGNOSIS — I10 Essential (primary) hypertension: Secondary | ICD-10-CM

## 2020-02-17 DIAGNOSIS — Z1211 Encounter for screening for malignant neoplasm of colon: Secondary | ICD-10-CM

## 2020-02-17 DIAGNOSIS — E782 Mixed hyperlipidemia: Secondary | ICD-10-CM

## 2020-02-17 DIAGNOSIS — K219 Gastro-esophageal reflux disease without esophagitis: Secondary | ICD-10-CM

## 2020-02-28 ENCOUNTER — Encounter: Payer: Self-pay | Admitting: Gastroenterology

## 2020-02-28 ENCOUNTER — Other Ambulatory Visit: Payer: Self-pay

## 2020-02-28 ENCOUNTER — Ambulatory Visit (AMBULATORY_SURGERY_CENTER): Payer: BC Managed Care – PPO | Admitting: Gastroenterology

## 2020-02-28 VITALS — BP 122/59 | HR 73 | Temp 97.8°F | Resp 10 | Ht 67.0 in | Wt 221.0 lb

## 2020-02-28 DIAGNOSIS — D125 Benign neoplasm of sigmoid colon: Secondary | ICD-10-CM | POA: Diagnosis not present

## 2020-02-28 DIAGNOSIS — Z1211 Encounter for screening for malignant neoplasm of colon: Secondary | ICD-10-CM | POA: Diagnosis not present

## 2020-02-28 DIAGNOSIS — D124 Benign neoplasm of descending colon: Secondary | ICD-10-CM

## 2020-02-28 DIAGNOSIS — D120 Benign neoplasm of cecum: Secondary | ICD-10-CM

## 2020-02-28 MED ORDER — SODIUM CHLORIDE 0.9 % IV SOLN: 500.0000 mL | Freq: Once | INTRAVENOUS | Status: DC

## 2020-02-28 NOTE — Progress Notes (Signed)
[employer]

## 2020-02-28 NOTE — Patient Instructions (Addendum)
Health Maintenance Due  Topic Date Due  . INFLUENZA VACCINE - wanted to do covid 19 booster and then flu shot 2-3 weeks later 12/01/2019   Trial pepcid over the counter twice a day before meals- this has a better long term safety profile than prevacid IF it controls your reflux  Team please give him update on RUQ ultrasound scheduling/help to schedule reorder if needed)  We will call you within two weeks about your referral to ENT for hearing issues/eustachian tube issues. If you do not hear within 3 weeks, give [place] a call.    Heartburn Heartburn is a type of pain or discomfort that can happen in the throat or chest. It is often described as a burning pain. It may also cause a bad, acid-like taste in the mouth. Heartburn may feel worse when you lie down or bend over, and it is often worse at night. Heartburn may be caused by stomach contents that move back up into the esophagus (reflux). Follow these instructions at home: Eating and drinking   Avoid certain foods and drinks as told by your health care provider. This may include: ? Coffee and tea (with or without caffeine). ? Drinks that contain alcohol. ? Energy drinks and sports drinks. ? Carbonated drinks or sodas. ? Chocolate and cocoa. ? Peppermint and mint flavorings. ? Garlic and onions. ? Horseradish. ? Spicy and acidic foods, including peppers, chili powder, curry powder, vinegar, hot sauces, and barbecue sauce. ? Citrus fruit juices and citrus fruits, such as oranges, lemons, and limes. ? Tomato-based foods, such as red sauce, chili, salsa, and pizza with red sauce. ? Fried and fatty foods, such as donuts, french fries, potato chips, and high-fat dressings. ? High-fat meats, such as hot dogs and fatty cuts of red and white meats, such as rib eye steak, sausage, ham, and bacon. ? High-fat dairy items, such as whole milk, butter, and cream cheese.  Eat small, frequent meals instead of large meals.  Avoid drinking large  amounts of liquid with your meals.  Avoid eating meals during the 2-3 hours before bedtime.  Avoid lying down right after you eat.  Do not exercise right after you eat. Lifestyle      If you are overweight, reduce your weight to an amount that is healthy for you. Ask your health care provider for guidance about a safe weight loss goal.  Do not use any products that contain nicotine or tobacco, such as cigarettes, e-cigarettes, and chewing tobacco. These can make your symptoms worse. If you need help quitting, ask your health care provider.  Wear loose-fitting clothing. Do not wear anything tight around your waist that causes pressure on your abdomen.  Raise (elevate) the head of your bed about 6 inches (15 cm) when you sleep.  Try to reduce your stress, such as with yoga or meditation. If you need help reducing stress, ask your health care provider. General instructions  Pay attention to any changes in your symptoms.  Take over-the-counter and prescription medicines only as told by your health care provider. ? Do not take aspirin, ibuprofen, or other NSAIDs unless your health care provider told you to do so. ? Stop medicines only as told by your health care provider. If you stop taking some medicines too quickly, your symptoms may get worse.  Keep all follow-up visits as told by your health care provider. This is important. Contact a health care provider if:  You have new symptoms.  You have unexplained weight loss.  You have difficulty swallowing, or it hurts to swallow.  You have wheezing or a persistent cough.  Your symptoms do not improve with treatment.  You have frequent heartburn for more than 2 weeks. Get help right away if:  You have pain in your arms, neck, jaw, teeth, or back.  You feel sweaty, dizzy, or light-headed.  You have chest pain or shortness of breath.  You vomit and your vomit looks like blood or coffee grounds.  Your stool is bloody or  black. These symptoms may represent a serious problem that is an emergency. Do not wait to see if the symptoms will go away. Get medical help right away. Call your local emergency services (911 in the [place] Do not drive yourself to the hospital. Summary  Heartburn is a type of pain or discomfort that can happen in the throat or chest. It is often described as a burning pain. It may also cause a bad, acid-like taste in the mouth.  Avoid certain foods and drinks as told by your health care provider.  Take over-the-counter and prescription medicines only as told by your health care provider. Do not take aspirin, ibuprofen, or other NSAIDs unless your health care provider told you to do so.  Contact a health care provider if your symptoms do not improve or they get worse. This information is not intended to replace advice given to you by your health care provider. Make sure you discuss any questions you have with your health care provider. Document Revised: 09/18/2017 Document Reviewed: 09/18/2017 Elsevier Patient Education  [place].

## 2020-02-28 NOTE — Progress Notes (Signed)
Report to PACU, RN, vss, BBS= Clear.  

## 2020-02-28 NOTE — Patient Instructions (Signed)
Thank you for letting us take care of your healthcare needs today. Please see handouts given to you on Polyps and Hemorrhoids.     YOU HAD AN ENDOSCOPIC PROCEDURE TODAY AT THE [facility]:   Refer to the procedure report that was given to you for any specific questions about what was found during the examination.  If the procedure report does not answer your questions, please call your gastroenterologist to clarify.  If you requested that your care partner not be given the details of your procedure findings, then the procedure report has been included in a sealed envelope for you to review at your convenience later.  YOU SHOULD EXPECT: Some feelings of bloating in the abdomen. Passage of more gas than usual.  Walking can help get rid of the air that was put into your GI tract during the procedure and reduce the bloating. If you had a lower endoscopy (such as a colonoscopy or flexible sigmoidoscopy) you may notice spotting of blood in your stool or on the toilet paper. If you underwent a bowel prep for your procedure, you may not have a normal bowel movement for a few days.  Please Note:  You might notice some irritation and congestion in your nose or some drainage.  This is from the oxygen used during your procedure.  There is no need for concern and it should clear up in a day or so.  SYMPTOMS TO REPORT IMMEDIATELY:  Following lower endoscopy (colonoscopy or flexible sigmoidoscopy):  Excessive amounts of blood in the stool  Significant tenderness or worsening of abdominal pains  Swelling of the abdomen that is new, acute  Fever of 100F or higher  For urgent or emergent issues, a gastroenterologist can be reached at any hour by calling [phone]. Do not use MyChart messaging for urgent concerns.    DIET:  We do recommend a small meal at first, but then you may proceed to your regular diet.  Drink plenty of fluids but you should avoid alcoholic beverages for 24  hours.  ACTIVITY:  You should plan to take it easy for the rest of today and you should NOT DRIVE or use heavy machinery until tomorrow (because of the sedation medicines used during the test).    FOLLOW UP: Our staff will call the number listed on your records 48-72 hours following your procedure to check on you and address any questions or concerns that you may have regarding the information given to you following your procedure. If we do not reach you, we will leave a message.  We will attempt to reach you two times.  During this call, we will ask if you have developed any symptoms of COVID 19. If you develop any symptoms (ie: fever, flu-like symptoms, shortness of breath, cough etc.) before then, please call [phone].  If you test positive for Covid 19 in the 2 weeks post procedure, please call and report this information to us.    If any biopsies were taken you will be contacted by phone or by letter within the next 1-3 weeks.  Please call us at [phone] if you have not heard about the biopsies in 3 weeks.    SIGNATURES/CONFIDENTIALITY: You and/or your care partner have signed paperwork which will be entered into your electronic medical record.  These signatures attest to the fact that that the information above on your After Visit Summary has been reviewed and is understood.  Full responsibility of the confidentiality of this discharge information   lies with you and/or your care-partner.  

## 2020-02-28 NOTE — Op Note (Signed)
[place] Patient Name: Marc Ponce Procedure Date: 02/28/2020 7:17 AM MRN: [id number] Endoscopist: Milus Banister , MD Age: 64 Referring MD:  Date of Birth: 10/05/1955 Gender: Male Account #: [id number] Procedure:                Colonoscopy Indications:              Screening for colorectal malignant neoplasm Medicines:                Monitored Anesthesia Care Procedure:                Pre-Anesthesia Assessment:                           - Prior to the procedure, a History and Physical                            was performed, and patient medications and                            allergies were reviewed. The patient's tolerance of                            previous anesthesia was also reviewed. The risks                            and benefits of the procedure and the sedation                            options and risks were discussed with the patient.                            All questions were answered, and informed consent                            was obtained. Prior Anticoagulants: The patient has                            taken no previous anticoagulant or antiplatelet                            agents. ASA Grade Assessment: II - A patient with                            mild systemic disease. After reviewing the risks                            and benefits, the patient was deemed in                            satisfactory condition to undergo the procedure.                           After obtaining informed consent, the colonoscope  was passed under direct vision. Throughout the                            procedure, the patient's blood pressure, pulse, and                            oxygen saturations were monitored continuously. The                            Colonoscope was introduced through the anus and                            advanced to the the cecum, identified by                            appendiceal orifice and  ileocecal valve. The                            colonoscopy was performed without difficulty. The                            patient tolerated the procedure well. The quality                            of the bowel preparation was good. The ileocecal                            valve, appendiceal orifice, and rectum were                            photographed. Scope In: 8:05:57 AM Scope Out: 8:22:48 AM Scope Withdrawal Time: 0 hours 15 minutes 14 seconds  Total Procedure Duration: 0 hours 16 minutes 51 seconds  Findings:                 Four sessile polyps were found in the sigmoid                            colon, ascending colon and cecum. The polyps were 2                            to5 mm in size. These polyps were removed with a                            cold snare. Resection and retrieval were complete.                            The 55m sessile polyp in the cecum was a bit                            atypical appearing (vasculasr but with discrete,                            raised borders) and it  was nearby more obvious AVMs                            in cecum. After cold snare resection the site bled                            more than usual and required placement of a single                            endoclip.                           Internal hemorrhoids were found. The hemorrhoids                            were medium-sized.                           The exam was otherwise without abnormality on                            direct and retroflexion views. Complications:            No immediate complications. Estimated blood loss:                            None. Estimated Blood Loss:     Estimated blood loss: none. Impression:               - Four 2 to 5 mm polyps in the sigmoid colon, in                            the ascending colon and in the cecum, removed with                            a cold snare. Resected and retrieved.                           - The 46m sessile  polyp in the cecum was a bit                            atypical appearing (vasculasr but with discrete,                            raised borders) and it was nearby more obvious AVMs                            in cecum. After cold snare resection the site bled                            more than usual and required placement of a single                            endoclip.                           -  Internal hemorrhoids.                           - The examination was otherwise normal on direct                            and retroflexion views. Recommendation:           - Patient has a contact number available for                            emergencies. The signs and symptoms of potential                            delayed complications were discussed with the                            patient. Return to normal activities tomorrow.                            Written discharge instructions were provided to the                            patient.                           - Resume previous diet.                           - Continue present medications.                           - Await pathology results.                           - My office will arrange referral for in office                            internal hemorrhoidal banding. Milus Banister, MD 02/28/2020 8:28:03 AM This report has been signed electronically.

## 2020-02-28 NOTE — Progress Notes (Signed)
Phone [phone] In person visit   Subjective:   Marc Ponce is a 64 y.o. year old very pleasant male patient who presents for/with See problem oriented charting Chief Complaint  Patient presents with  . Hypertension  . Mixed Hyperlipidemia   This visit occurred during the SARS-CoV-2 public health emergency.  Safety protocols were in place, including screening questions prior to the visit, additional usage of staff PPE, and extensive cleaning of exam room while observing appropriate contact time as indicated for disinfecting solutions.   Past Medical History-  Patient Active Problem List   Diagnosis Date Noted  . Morbid obesity ([place]) 07/10/2013    Priority: High  . Alcohol abuse, daily use 04/19/2011    Priority: High  . GERD (gastroesophageal reflux disease) 12/26/2017    Priority: Medium  . Elevated LFTs 04/21/2017    Priority: Medium  . HTN (hypertension) 03/12/2013    Priority: Medium  . Hyperlipidemia 03/12/2013    Priority: Medium  . Steatohepatitis 04/19/2011    Priority: Medium  . History of cellulitis 12/26/2017    Priority: Low  . History of arthroplasty of left knee 05/26/2017    Priority: Low  . Pain in left knee 05/10/2017    Priority: Low    Medications- reviewed and updated Current Outpatient Medications  Medication Sig Dispense Refill  . lansoprazole (PREVACID) 30 MG capsule Take 30 mg by mouth daily at 12 noon.    Marland Kitchen losartan-hydrochlorothiazide (HYZAAR) 100-12.5 MG tablet Take 1 tablet by mouth daily. 90 tablet 3  . metoprolol succinate (TOPROL-XL) 50 MG 24 hr tablet TAKE 1 TABLET (50 MG TOTAL) BY MOUTH DAILY. TAKE WITH OR IMMEDIATELY FOLLOWING A MEAL. 90 tablet 3  . Probiotic Product (PROBIOTIC DAILY PO) Take by mouth.    Marland Kitchen augmented betamethasone dipropionate (DIPROLENE-AF) 0.05 % cream Apply topically as needed. (Patient not taking: Reported on 02/14/2020) 50 g 2   No current facility-administered medications for this visit.       Objective:  BP 128/72   Pulse 72   Temp 97.8 F (36.6 C) (Temporal)   Resp 18   Ht 5' 7"  (1.702 m)   Wt 212 lb 12.8 oz (96.5 kg)   SpO2 98%   BMI 33.33 kg/m  Gen: NAD, resting comfortably Tm normal.  CV: RRR no murmurs rubs or gallops Lungs: CTAB no crackles, wheeze, rhonchi Abdomen: soft/nontender/nondistended/normal bowel sounds. No rebound or guarding.  Ext: trace to 1+ edema Skin: warm, dry    Assessment and Plan   #colonoscopy 02/28/20 - pending path. banding for hemorrhoids planned  # dermatology did some cryotherapy.   # hearing loss- intermittent issues over the last year. Has sensation of right eustachian tube being clogged- sometimes slightly better when clears throat. Will refer to ENT for their expertise given ongoing issues.   #hypertension S: medication: Hyzaar 100-12.5Mg, Metoprolol 50Mg extended-release BP Readings from Last 3 Encounters:  03/05/20 128/72  02/28/20 (!) 122/59  08/15/19 136/64  A/P: Excellent control today-continue current medications  # GERD S:Medication: Prevacid 30Mg A/P: Reasonable control.  Discussed possibly stepping down to Pepcid-he is willing to trial   #hyperlipidemia S: Medication:None-holding off with LFT elevation.  10-year ASCVD risk of 14.8% Lab Results  Component Value Date   CHOL 224 (H) 08/15/2019   HDL 35.50 (L) 08/15/2019   LDLCALC 79 04/15/2017   LDLDIRECT 123.0 08/15/2019   TRIG 277.0 (H) 08/15/2019   CHOLHDL 6 08/15/2019   A/P: slightly high- working on weight loss and cutting down  on alcohol- will repeat at physical and reconsider  #Elevated LFTs/obesity-concern for alcohol-related liver damage.  Recommended 1 month follow-up LFTs and right upper quadrant ultrasound after last visit-neither of these was completed.  We will update CBC/CMP today.  Discussed ultrasound and he is not sure he was every called- will have team update him on this.  Patient reports alcohol wise-last visit was down to 3 glasses of  wine per day from 6. Has tried to cut down on alcohol- down to 2 max per day. Down to 14 per week on average. Down 16 lbs from April as well- smaller portions and watching out for sweets (congratulated on weight loss and encouraged to continue) Hepatic Function Latest Ref Rng & Units 08/15/2019 04/16/2018 04/15/2017  Total Protein 6.0 - 8.3 g/dL 7.3 7.3 7.4  Albumin 3.5 - 5.2 g/dL 3.5 4.0 4.3  AST 0 - 37 U/L 77(H) 57(H) 90(H)  ALT 0 - 53 U/L 38 46 67(H)  Alk Phosphatase 39 - 117 U/L 124(H) 86 118(H)  Total Bilirubin 0.2 - 1.2 mg/dL 2.8(H) 1.4(H) 1.0    # slight anemia- has noted some fatigue and some pallor to skin. Make sur Wynona Dove is not worsening. If it is consider hematology consult since colonoscopy reassuring Lab Results  Component Value Date   WBC 6.6 08/15/2019   HGB 12.9 (L) 08/15/2019   HCT 38.2 (L) 08/15/2019   MCV 99.7 08/15/2019   PLT 128.0 (L) 08/15/2019   # Hyperglycemia/insulin resistance/prediabetes S:  Medication: none Exercise and diet- working on healthy eating as above. Doing some walking Lab Results  Component Value Date   HGBA1C 6.0 08/15/2019   HGBA1C 5.7 (H) 04/15/2017   HGBA1C 5.9 06/27/2015  A/P: hopefully controlled- continue weight loss efforts  Recommended follow up: Return in about 6 months (around 09/02/2020) for physical or sooner if needed. Future Appointments  Date Time Provider [place]  03/31/2020  3:40 PM Ladene Artist, MD [facility]  06/01/2020  9:30 AM Lavonna Monarch, MD [facility]   Lab/Order associations:   ICD-10-CM   1. Primary hypertension  I10 CBC With Differential/Platelet    COMPLETE METABOLIC PANEL WITH GFR  2. Gastroesophageal reflux disease without esophagitis  K21.9   3. Mixed hyperlipidemia  E78.2   4. Steatohepatitis  K75.81 CBC With Differential/Platelet    COMPLETE METABOLIC PANEL WITH GFR  5. Elevated LFTs  R79.89   6. Hyperglycemia  R73.9 CBC With Differential/Platelet    Hemoglobin A1c   Return  precautions advised.  Garret Reddish, MD

## 2020-02-28 NOTE — Progress Notes (Signed)
Called to room to assist during endoscopic procedure.  Patient ID and intended procedure confirmed with present staff. Received instructions for my participation in the procedure from the performing physician.  

## 2020-03-03 ENCOUNTER — Telehealth: Payer: Self-pay | Admitting: *Deleted

## 2020-03-03 NOTE — Telephone Encounter (Signed)
°  Follow up Call-  Call back number 02/28/2020  Post procedure Call Back phone  # [phone]  Permission to leave phone message Yes  Some recent data might be hidden     Patient questions:  Do you have a fever, pain , or abdominal swelling? No. Pain Score  0 *  Have you tolerated food without any problems? Yes.    Have you been able to return to your normal activities? Yes.    Do you have any questions about your discharge instructions: Diet   No. Medications  No. Follow up visit  No.  Do you have questions or concerns about your Care? No.  Actions: * If pain score is 4 or above: No action needed, pain <4.   1. Have you developed a fever since your procedure?no  2.   Have you had an respiratory symptoms (SOB or cough) since your procedure? no  3.   Have you tested positive for COVID 19 since your procedure no  4.   Have you had any family members/close contacts diagnosed with the COVID 19 since your procedure?  no   If yes to any of these questions please route to Joylene John, RN and Joella Prince, RN

## 2020-03-05 ENCOUNTER — Ambulatory Visit (INDEPENDENT_AMBULATORY_CARE_PROVIDER_SITE_OTHER): Payer: BC Managed Care – PPO | Admitting: Family Medicine

## 2020-03-05 ENCOUNTER — Other Ambulatory Visit: Payer: Self-pay

## 2020-03-05 ENCOUNTER — Encounter: Payer: Self-pay | Admitting: Family Medicine

## 2020-03-05 VITALS — BP 128/72 | HR 72 | Temp 97.8°F | Resp 18 | Ht 67.0 in | Wt 212.8 lb

## 2020-03-05 DIAGNOSIS — R7989 Other specified abnormal findings of blood chemistry: Secondary | ICD-10-CM

## 2020-03-05 DIAGNOSIS — K7581 Nonalcoholic steatohepatitis (NASH): Secondary | ICD-10-CM

## 2020-03-05 DIAGNOSIS — I10 Essential (primary) hypertension: Secondary | ICD-10-CM | POA: Diagnosis not present

## 2020-03-05 DIAGNOSIS — K219 Gastro-esophageal reflux disease without esophagitis: Secondary | ICD-10-CM

## 2020-03-05 DIAGNOSIS — R739 Hyperglycemia, unspecified: Secondary | ICD-10-CM | POA: Diagnosis not present

## 2020-03-05 DIAGNOSIS — H9191 Unspecified hearing loss, right ear: Secondary | ICD-10-CM

## 2020-03-05 DIAGNOSIS — E782 Mixed hyperlipidemia: Secondary | ICD-10-CM | POA: Diagnosis not present

## 2020-03-06 ENCOUNTER — Other Ambulatory Visit: Payer: Self-pay

## 2020-03-06 DIAGNOSIS — R17 Unspecified jaundice: Secondary | ICD-10-CM

## 2020-03-06 DIAGNOSIS — D649 Anemia, unspecified: Secondary | ICD-10-CM

## 2020-03-06 DIAGNOSIS — D619 Aplastic anemia, unspecified: Secondary | ICD-10-CM

## 2020-03-06 DIAGNOSIS — R7401 Elevation of levels of liver transaminase levels: Secondary | ICD-10-CM

## 2020-03-06 LAB — CBC WITH DIFFERENTIAL/PLATELET
Absolute Monocytes: 578 cells/uL (ref 200–950)
Basophils Absolute: 39 cells/uL (ref 0–200)
Basophils Relative: 0.7 %
Eosinophils Absolute: 121 cells/uL (ref 15–500)
Eosinophils Relative: 2.2 %
HCT: 33.8 % — ABNORMAL LOW (ref 38.5–50.0)
Hemoglobin: 12.3 g/dL — ABNORMAL LOW (ref 13.2–17.1)
Lymphs Abs: 869 cells/uL (ref 850–3900)
MCH: 35.1 pg — ABNORMAL HIGH (ref 27.0–33.0)
MCHC: 36.4 g/dL — ABNORMAL HIGH (ref 32.0–36.0)
MCV: 96.6 fL (ref 80.0–100.0)
MPV: 11.7 fL (ref 7.5–12.5)
Monocytes Relative: 10.5 %
Neutro Abs: 3894 cells/uL (ref 1500–7800)
Neutrophils Relative %: 70.8 %
Platelets: 152 10*3/uL (ref 140–400)
RBC: 3.5 10*6/uL — ABNORMAL LOW (ref 4.20–5.80)
RDW: 14.9 % (ref 11.0–15.0)
Total Lymphocyte: 15.8 %
WBC: 5.5 10*3/uL (ref 3.8–10.8)

## 2020-03-06 LAB — COMPLETE METABOLIC PANEL WITH GFR
AG Ratio: 0.9 (calc) — ABNORMAL LOW (ref 1.0–2.5)
ALT: 55 U/L — ABNORMAL HIGH (ref 9–46)
AST: 118 U/L — ABNORMAL HIGH (ref 10–35)
Albumin: 3.3 g/dL — ABNORMAL LOW (ref 3.6–5.1)
Alkaline phosphatase (APISO): 116 U/L (ref 35–144)
BUN: 19 mg/dL (ref 7–25)
CO2: 23 mmol/L (ref 20–32)
Calcium: 9 mg/dL (ref 8.6–10.3)
Chloride: 104 mmol/L (ref 98–110)
Creat: 1.2 mg/dL (ref 0.70–1.25)
GFR, Est African American: 74 mL/min/{1.73_m2} (ref >=60)
GFR, Est Non African American: 64 mL/min/{1.73_m2} (ref >=60)
Globulin: 3.8 g/dL (calc) — ABNORMAL HIGH (ref 1.9–3.7)
Glucose, Bld: 163 mg/dL — ABNORMAL HIGH (ref 65–99)
Potassium: 4.1 mmol/L (ref 3.5–5.3)
Sodium: 136 mmol/L (ref 135–146)
Total Bilirubin: 6.3 mg/dL — ABNORMAL HIGH (ref 0.2–1.2)
Total Protein: 7.1 g/dL (ref 6.1–8.1)

## 2020-03-06 LAB — HEMOGLOBIN A1C
Hgb A1c MFr Bld: 5 % of total Hgb (ref <5.7)
Mean Plasma Glucose: 97 (calc)
eAG (mmol/L): 5.4 (calc)

## 2020-03-10 ENCOUNTER — Encounter: Payer: Self-pay | Admitting: Gastroenterology

## 2020-03-31 ENCOUNTER — Ambulatory Visit (INDEPENDENT_AMBULATORY_CARE_PROVIDER_SITE_OTHER): Payer: BC Managed Care – PPO | Admitting: Gastroenterology

## 2020-03-31 ENCOUNTER — Encounter: Payer: Self-pay | Admitting: Gastroenterology

## 2020-03-31 VITALS — BP 142/78 | HR 84 | Ht 67.0 in | Wt 212.4 lb

## 2020-03-31 DIAGNOSIS — K641 Second degree hemorrhoids: Secondary | ICD-10-CM

## 2020-03-31 DIAGNOSIS — K648 Other hemorrhoids: Secondary | ICD-10-CM

## 2020-03-31 DIAGNOSIS — L309 Dermatitis, unspecified: Secondary | ICD-10-CM

## 2020-03-31 NOTE — Progress Notes (Signed)
PROCEDURE NOTE: The patient presents with symptomatic bleeding grade 2 hemorrhoids, requesting rubber band ligation of their hemorrhoidal disease.  All risks, benefits and alternative forms of therapy were described and informed consent was obtained. Colonoscopy by Dr. Ardis Hughs on 02/28/2020 reviewed showing medium sized internal hemorrhoids and 4 small polyps. Path: tubular adenoma and sessile serrated polyps without dysplasia.  The anorectum was pre-medicated during digital exam with 0.125% NTG and 5% lidocaine. In the Left Lateral Decubitus position anoscopic examination revealed perianal dermatitis and grade 2 hemorrhoids in the RA > RP > LL positions.  The decision was made to band the RA internal hemorrhoid, and the [place] was used to perform band ligation without complication.  Digital anorectal examination was then performed to assure proper positioning of the band and to adjust the banded tissue as required. No adjustment was needed.  No complications were encountered and the patient tolerated the procedure well.  Lotrimin AF topically to perianal area BID for 7 days then prn for perianal dermatitis.  Dietary and behavioral recommendations were given and along with follow-up instructions.   Return for possible additional hemorrhoid banding in 2 - 3 weeks as required.  High fiber diet, Benefiber daily and at least 8 glasses of water daily. The patient was discharged home without pain or other post procedure problems.

## 2020-03-31 NOTE — Patient Instructions (Addendum)
Use over the counter Lotrimin AF cream to apply rectally twice daily x 1 week then as needed.  HEMORRHOID BANDING PROCEDURE    FOLLOW-UP CARE   1. The procedure you have had should have been relatively painless since the banding of the area involved does not have nerve endings and there is no pain sensation.  The rubber band cuts off the blood supply to the hemorrhoid and the band may fall off as soon as 48 hours after the banding (the band may occasionally be seen in the toilet bowl following a bowel movement). You may notice a temporary feeling of fullness in the rectum which should respond adequately to plain Tylenol or Motrin.  2. Following the banding, avoid strenuous exercise that evening and resume full activity the next day.  A sitz bath (soaking in a warm tub) or bidet is soothing, and can be useful for cleansing the area after bowel movements.     3. To avoid constipation, take two tablespoons of natural wheat bran, natural oat bran, flax, Benefiber or any over the counter fiber supplement and increase your water intake to 7-8 glasses daily.    4. Unless you have been prescribed anorectal medication, do not put anything inside your rectum for two weeks: No suppositories, enemas, fingers, etc.  5. Occasionally, you may have more bleeding than usual after the banding procedure.  This is often from the untreated hemorrhoids rather than the treated one.  Don't be concerned if there is a tablespoon or so of blood.  If there is more blood than this, lie flat with your bottom higher than your head and apply an ice pack to the area. If the bleeding does not stop within a half an hour or if you feel faint, call our office at [phone] or go to the emergency room.  6. Problems are not common; however, if there is a substantial amount of bleeding, severe pain, chills, fever or difficulty passing urine (very rare) or other problems, you should call us at [phone] or report to the  nearest emergency room.  7. Do not stay seated continuously for more than 2-3 hours for a day or two after the procedure.  Tighten your buttock muscles 10-15 times every two hours and take 10-15 deep breaths every 1-2 hours.  Do not spend more than a few minutes on the toilet if you cannot empty your bowel; instead re-visit the toilet at a later time.

## 2020-05-04 ENCOUNTER — Encounter: Payer: Self-pay | Admitting: Family Medicine

## 2020-05-12 ENCOUNTER — Encounter: Payer: Self-pay | Admitting: Gastroenterology

## 2020-05-12 ENCOUNTER — Ambulatory Visit: Payer: BC Managed Care – PPO | Admitting: Gastroenterology

## 2020-05-12 VITALS — BP 150/60 | HR 100 | Ht 67.0 in | Wt 221.0 lb

## 2020-05-12 DIAGNOSIS — K641 Second degree hemorrhoids: Secondary | ICD-10-CM

## 2020-05-12 DIAGNOSIS — K648 Other hemorrhoids: Secondary | ICD-10-CM

## 2020-05-12 NOTE — Patient Instructions (Signed)
Please start over the counter Lotrimin AF cream 2-3 for at least 7 days for your perianal dermatitis.   HEMORRHOID BANDING PROCEDURE    FOLLOW-UP CARE   1. The procedure you have had should have been relatively painless since the banding of the area involved does not have nerve endings and there is no pain sensation.  The rubber band cuts off the blood supply to the hemorrhoid and the band may fall off as soon as 48 hours after the banding (the band may occasionally be seen in the toilet bowl following a bowel movement). You may notice a temporary feeling of fullness in the rectum which should respond adequately to plain Tylenol or Motrin.  2. Following the banding, avoid strenuous exercise that evening and resume full activity the next day.  A sitz bath (soaking in a warm tub) or bidet is soothing, and can be useful for cleansing the area after bowel movements.     3. To avoid constipation, take two tablespoons of natural wheat bran, natural oat bran, flax, Benefiber or any over the counter fiber supplement and increase your water intake to 7-8 glasses daily.    4. Unless you have been prescribed anorectal medication, do not put anything inside your rectum for two weeks: No suppositories, enemas, fingers, etc.  5. Occasionally, you may have more bleeding than usual after the banding procedure.  This is often from the untreated hemorrhoids rather than the treated one.  Don't be concerned if there is a tablespoon or so of blood.  If there is more blood than this, lie flat with your bottom higher than your head and apply an ice pack to the area. If the bleeding does not stop within a half an hour or if you feel faint, call our office at [phone] or go to the emergency room.  6. Problems are not common; however, if there is a substantial amount of bleeding, severe pain, chills, fever or difficulty passing urine (very rare) or other problems, you should call us at [phone] or report to  the nearest emergency room.  7. Do not stay seated continuously for more than 2-3 hours for a day or two after the procedure.  Tighten your buttock muscles 10-15 times every two hours and take 10-15 deep breaths every 1-2 hours.  Do not spend more than a few minutes on the toilet if you cannot empty your bowel; instead re-visit the toilet at a later time.

## 2020-05-12 NOTE — Progress Notes (Signed)
PROCEDURE NOTE: The patient presents with symptomatic bleeding grade II hemorrhoids, requesting rubber band ligation of their hemorrhoidal disease.  All risks, benefits and alternative forms of therapy were described and informed consent was obtained.  Following banding #1 he noted increased bleeding for several days. Now the bleeding is back to his baseline. Hemorrhoid prolapse and rectal pain have improved but both persist. After a thorough discussion of mgmt options, including an offer to refer to a colorectal surgeon for further mgmt, he decided to proceed with anoscopy to reassess and he decided to proceed with banding of the next hemorrhoid.   The anorectum was pre-medicated during digital exam with 0.125% NTG and 5% lidocaine. Marked perianal erythema was noted about the anus c/w dermatitis.  In the Left Lateral Decubitus position anoscopic examination revealed grade II hemorrhoids in the RP > RA > LL positions. The RP hemorrhoid was mildly bleeding with insertion of the anoscope.   The decision was made to band the RP internal hemorrhoid, and the [place] was used to perform band ligation without complication. There was mild bleeding with band placement with a small amount of blood on the ligator.  Digital anorectal examination was then performed to assure proper positioning of the band and to adjust the banded tissue as required. No adjustment was needed.  No complications were encountered and the patient tolerated the procedure well.  Dietary and behavioral recommendations were given and along with follow-up instructions.   Return for possible additional hemorrhoid banding in 2 - 3 weeks as required.  High fiber diet, Benefiber daily and at least 8 glasses of water daily. Avoid straining with bowel movement, heavy exertion and prolonged sitting.  Lotrimin AF cream at least bid and preferably tid for at least 7 days for perianal dermatitis. If this does not resolve then recommend  dermatology referral.  The patient was discharged home without pain or other post procedure problems.

## 2020-05-28 ENCOUNTER — Encounter: Payer: Self-pay | Admitting: Gastroenterology

## 2020-05-28 ENCOUNTER — Ambulatory Visit: Payer: BC Managed Care – PPO | Admitting: Gastroenterology

## 2020-05-28 VITALS — BP 130/72 | HR 84 | Ht 67.0 in | Wt 217.0 lb

## 2020-05-28 DIAGNOSIS — K641 Second degree hemorrhoids: Secondary | ICD-10-CM

## 2020-05-28 NOTE — Patient Instructions (Signed)
Your next banding appointment with Dr. Fuller Plan is on 06/25/20 at 3:40pm.  Thank you for choosing me and [facility].  Pricilla Riffle. Dagoberto Ligas., MD., Marval Regal

## 2020-05-28 NOTE — Progress Notes (Signed)
Pt presents for banding #3. He has noted substantial improvement in symptoms.  He relates only minor amounts of bleeding occasionally with bowel movements. He has had many bowel movements without any bleeding.  Prolapse has completely resolved.  Discomfort with sitting for prolonged periods is substantially improved but has not completely resolved.  He would like to wait his third banding for a few more weeks.  He prefers an afternoon appointment for banding as opposed to today's appointment in the morning.  We discussed proceeding with anoscopy prior to his third banding to assess the previously treated hemorrhoids.  We talked about the expected success hemorrhoidal banding generally reducing symptoms around 80% to 90%.  He had several questions and I addressed the questions to his satisfaction. Not reexamined today.  Reschedule hemorrhoid banding for an afternoon session in the next several weeks at his convenience.

## 2020-06-01 ENCOUNTER — Other Ambulatory Visit: Payer: Self-pay

## 2020-06-01 ENCOUNTER — Encounter: Payer: Self-pay | Admitting: Dermatology

## 2020-06-01 ENCOUNTER — Ambulatory Visit: Payer: BC Managed Care – PPO | Admitting: Dermatology

## 2020-06-01 DIAGNOSIS — D234 Other benign neoplasm of skin of scalp and neck: Secondary | ICD-10-CM

## 2020-06-01 DIAGNOSIS — L570 Actinic keratosis: Secondary | ICD-10-CM | POA: Diagnosis not present

## 2020-06-01 DIAGNOSIS — Z1283 Encounter for screening for malignant neoplasm of skin: Secondary | ICD-10-CM | POA: Diagnosis not present

## 2020-06-01 DIAGNOSIS — D239 Other benign neoplasm of skin, unspecified: Secondary | ICD-10-CM

## 2020-06-01 DIAGNOSIS — L739 Follicular disorder, unspecified: Secondary | ICD-10-CM

## 2020-06-06 ENCOUNTER — Encounter: Payer: Self-pay | Admitting: Dermatology

## 2020-06-06 NOTE — Progress Notes (Signed)
   Follow-Up Visit   Subjective  Marc Ponce is a 65 y.o. male who presents for the following: Follow-up (6 moth- f/u- right temple- LN2 last office visit- went away).  Check skin Location: Rash on side Duration:  Quality:  Associated Signs/Symptoms: Modifying Factors:  Severity:  Timing: Context:   Objective  Well appearing patient in no apparent distress; mood and affect are within normal limits. Objective  Neck - Anterior: Waist up skin examination- no atypical moles or non mole skin cancer  Objective  Left Parietal Scalp: 2 mm deep dermal blue-gray macule; dermoscopy amorphous  Objective  Right Abdomen (side) - Upper: Lichenified patch with small follicular papules  Objective  Right Temple: No palpable residual but still very subtle fine sandpaperlike texture.   All skin waist up examined.   Assessment & Plan    Encounter for screening for malignant neoplasm of skin Neck - Anterior  Yearly skin check  Blue nevus Left Parietal Scalp  Okay to leave if stable  Folliculitis Right Abdomen (side) - Upper  Over the counter Cerave anti itch lotion  AK (actinic keratosis) Right Temple  Defer further intervention if stable.     I, Lavonna Monarch, MD, have reviewed all documentation for this visit.  The documentation on 06/06/20 for the exam, diagnosis, procedures, and orders are all accurate and complete.

## 2020-06-25 ENCOUNTER — Encounter: Payer: Self-pay | Admitting: Gastroenterology

## 2020-06-25 ENCOUNTER — Ambulatory Visit: Payer: BC Managed Care – PPO | Admitting: Gastroenterology

## 2020-06-25 VITALS — BP 142/70 | HR 76 | Ht 67.0 in | Wt 211.6 lb

## 2020-06-25 DIAGNOSIS — K641 Second degree hemorrhoids: Secondary | ICD-10-CM | POA: Diagnosis not present

## 2020-06-25 NOTE — Progress Notes (Signed)
PROCEDURE NOTE: The patient presents with symptomatic bleeding, grade II hemorrhoids, requesting rubber band ligation of their hemorrhoidal disease.  All risks, benefits and alternative forms of therapy were described and informed consent was obtained.   #1 RP banded 1/11 #2 RA banded 1/27  Currently he has minimal bleeding when straining, otherwise no bleeding. No recent prolapse. Symptoms substantially improved.   The anorectum was pre-medicated during digital exam with 0.125% NTG and 5% lidocaine. In the Left Lateral Decubitus position anoscopic examination revealed grade II hemorrhoids in the LL > RA > RP positions.   The decision was made to band the LL internal hemorrhoid, and the [place] was used to perform band ligation without complication.  Digital anorectal examination was then performed to assure proper positioning of the band and to adjust the banded tissue as required. No adjustment was needed.  No complications were encountered and the patient tolerated the procedure well.  Dietary and behavioral recommendations were given and along with follow-up instructions.   Return for possible additional hemorrhoid banding in 2 - 3 weeks as required.  High fiber diet, Benefiber daily and at least 8 glasses of water daily. The patient was discharged home without pain or other post procedure problems. Pt advised to call if hemorrhoid symptoms active after 1 month.

## 2020-06-25 NOTE — Patient Instructions (Signed)

## 2020-08-03 ENCOUNTER — Telehealth: Payer: Self-pay | Admitting: Family Medicine

## 2020-08-03 NOTE — Telephone Encounter (Signed)
Patient is leaving country this Thursday and will be ut of medication before he gets back to the [place] and would like refill before he leaves on Thursday

## 2020-08-03 NOTE — Telephone Encounter (Signed)
Medications refilled

## 2020-08-27 NOTE — Patient Instructions (Incomplete)
Please stop by lab before you go If you have mychart- we will send your results within 3 business days of [place] receiving them.  If you do not have mychart- we will call you about results within 5 business days of [place] receiving them.  *please also note that you will see labs on mychart as soon as they post. I will later go in and write notes on them- will say "notes from Dr. Yong Channel"  Health Maintenance Due  Topic Date Due  . COVID-19 Vaccine (3 - Pfizer risk 4-dose series) 09/03/2019   Depression screen [facility] 2/9 08/15/2019 10/15/2018 04/16/2018  Decreased Interest 0 1 0  Down, Depressed, Hopeless 0 1 0  PHQ - 2 Score 0 2 0  Altered sleeping 0 2 -  Tired, decreased energy 0 2 -  Change in appetite 0 1 -  Feeling bad or failure about yourself  0 0 -  Trouble concentrating 0 1 -  Moving slowly or fidgety/restless 0 0 -  Suicidal thoughts 0 0 -  PHQ-9 Score 0 8 -  Difficult doing work/chores Not difficult at all - -

## 2020-08-27 NOTE — Progress Notes (Deleted)
Phone: [phone]   Subjective:  Patient presents today for their annual physical. Chief complaint-noted.   See problem oriented charting- ROS- full  review of systems was completed and negative  except for: ***  The following were reviewed and entered/updated in epic: Past Medical History:  Diagnosis Date  . Allergy   . Arthritis   . Diverticulitis   . GERD (gastroesophageal reflux disease)   . History of chicken pox   . Hypertension   . Peri-rectal abscess   . Rectal pain    Patient Active Problem List   Diagnosis Date Noted  . GERD (gastroesophageal reflux disease) 12/26/2017  . History of cellulitis 12/26/2017  . History of arthroplasty of left knee 05/26/2017  . Pain in left knee 05/10/2017  . Elevated LFTs 04/21/2017  . Morbid obesity ([place]) 07/10/2013  . HTN (hypertension) 03/12/2013  . Hyperlipidemia 03/12/2013  . Steatohepatitis 04/19/2011  . Alcohol abuse, daily use 04/19/2011   Past Surgical History:  Procedure Laterality Date  . arthoscopic surgery on Left Knee  04/10/2014  . fatty tumor removed Left    left side of face  . FRACTURE SURGERY     left shoulder x 6 yrs ago and left x 42 yrs ago femur -knee cap  . HEMORRHOID BANDING    . 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 plate insertion  5366, 2011   humerus 1977, femur 2011  . REPLACEMENT TOTAL KNEE Left 2019   Dr. Alvan Dame. [facility]  . SHOULDER SURGERY  01/12/2010   Left     Family History  Problem Relation Age of Onset  . Dementia Mother   . Arthritis Mother   . Heart disease Father 32       MI   . Hypertension Father   . Hyperlipidemia Father   . COPD Father   . Diabetes Father   . Hearing loss Father   . Heart attack Father   . Kidney disease Father   . Heart disease Maternal Grandmother   . Arthritis Maternal Grandmother   . Heart disease Maternal Grandfather   . Lung cancer Maternal Grandfather    . Heart disease Paternal Grandmother   . Stroke Paternal Grandmother   . Leukemia Paternal Grandfather   . Colon cancer Neg Hx   . Colon polyps Neg Hx   . Stomach cancer Neg Hx   . Esophageal cancer Neg Hx     Medications- reviewed and updated Current Outpatient Medications  Medication Sig Dispense Refill  . augmented betamethasone dipropionate (DIPROLENE-AF) 0.05 % cream Apply topically as needed. 50 g 2  . lansoprazole (PREVACID) 30 MG capsule Take 30 mg by mouth daily at 12 noon.    Marland Kitchen losartan-hydrochlorothiazide (HYZAAR) 100-12.5 MG tablet TAKE 1 TABLET BY MOUTH EVERY DAY 90 tablet 3  . metoprolol succinate (TOPROL-XL) 50 MG 24 hr tablet TAKE 1 TABLET (50 MG TOTAL) BY MOUTH DAILY. TAKE WITH OR IMMEDIATELY FOLLOWING A MEAL. 90 tablet 3  . Probiotic Product (PROBIOTIC DAILY PO) Take by mouth.     No current facility-administered medications for this visit.    Allergies-reviewed and updated No Known Allergies  Social History   Social History Narrative   Married. Adopted son of prior GF who died in fire, 2 step daughters.       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 Even in [place] driving to [place].       Hobbies: photography, site seeing, auto racing   Objective  Objective:  There were no vitals taken for this visit. Gen: NAD, resting comfortably HEENT: Mucous membranes are moist. Oropharynx normal Neck: no thyromegaly CV: RRR no murmurs rubs or gallops Lungs: CTAB no crackles, wheeze, rhonchi Abdomen: soft/nontender/nondistended/normal bowel sounds. No rebound or guarding.  Ext: no edema Skin: warm, dry Neuro: grossly normal, moves all extremities, PERRLA ***   Assessment and Plan  65 y.o. male presenting for annual physical.  Health Maintenance counseling: 1. Anticipatory guidance: Patient counseled regarding regular dental exams ***q6 months, eye exams ***yearly,  avoiding smoking and  second hand smoke*** , limiting alcohol to 2 beverages per day ***.   2. Risk factor reduction:  Advised patient of need for regular exercise and diet rich and fruits and vegetables to reduce risk of heart attack and stroke. Exercise- ***. Diet-***.  Wt Readings from Last 3 Encounters:  06/25/20 211 lb 9.6 oz (96 kg)  05/28/20 217 lb (98.4 kg)  05/12/20 221 lb (100.2 kg)   3. Immunizations/screenings/ancillary studies Immunization History  Administered Date(s) Administered  . Influenza Inj Mdck Quad Pf 02/16/2018  . Influenza,inj,Quad PF,6+ Mos 06/27/2015, 01/09/2016  . Influenza,inj,quad, With Preservative 02/16/2018  . Influenza-Unspecified 01/31/2018  . PFIZER(Purple Top)SARS-COV-2 Vaccination 07/15/2019, 08/06/2019  . Tdap 06/27/2015  . Zoster Recombinat (Shingrix) 04/16/2018, 07/17/2018   Health Maintenance Due  Topic Date Due  . COVID-19 Vaccine (3 - Pfizer risk 4-dose series) 09/03/2019   4. Prostate cancer screening- ***  Lab Results  Component Value Date   PSA 0.20 08/15/2019   PSA 2.62 04/16/2018   PSA 0.22 06/27/2015   5. Colon cancer screening - *** 6. Skin cancer screening- ***advised regular sunscreen use. Denies worrisome, changing, or new skin lesions.  7. *** smoker 8. STD screening - ***  Status of chronic or acute concerns  #hypertension S: medication: metoprolol 35m daily, losartan-hctz 100-12.557mdaily. Home readings #s: *** BP Readings from Last 3 Encounters:  06/25/20 (!) 142/70  05/28/20 130/72  05/12/20 (!) 150/60  A/P: ***  #hyperlipidemia S: Medication:***  Lab Results  Component Value Date   CHOL 224 (H) 08/15/2019   HDL 35.50 (L) 08/15/2019   LDLCALC 79 04/15/2017   LDLDIRECT 123.0 08/15/2019   TRIG 277.0 (H) 08/15/2019   CHOLHDL 6 08/15/2019   A/P: ***   #History adenomatous polyp of the colon as well as sessile serrated polyp 02/28/2020-repeat 3 years***  *** No diagnosis found.  Recommended follow up: ***No follow-ups on  file. Future Appointments  Date Time Provider [place]5/06/2020 10:40 AM HuMarin OlpMD [facility]    No chief complaint on file.  Lab/Order associations:*** fasting No diagnosis found.  No orders of the defined types were placed in this encounter.   Return precautions advised.  KeClyde LundborgCMA

## 2020-09-01 ENCOUNTER — Encounter: Payer: BC Managed Care – PPO | Admitting: Family Medicine

## 2020-09-01 DIAGNOSIS — R739 Hyperglycemia, unspecified: Secondary | ICD-10-CM

## 2020-09-01 DIAGNOSIS — I10 Essential (primary) hypertension: Secondary | ICD-10-CM

## 2020-09-01 DIAGNOSIS — E782 Mixed hyperlipidemia: Secondary | ICD-10-CM

## 2020-09-01 DIAGNOSIS — Z0000 Encounter for general adult medical examination without abnormal findings: Secondary | ICD-10-CM

## 2020-10-02 ENCOUNTER — Ambulatory Visit: Payer: BC Managed Care – PPO | Admitting: Family Medicine

## 2020-11-06 ENCOUNTER — Other Ambulatory Visit: Payer: BC Managed Care – PPO

## 2020-11-20 ENCOUNTER — Other Ambulatory Visit: Payer: BC Managed Care – PPO

## 2020-11-30 ENCOUNTER — Ambulatory Visit: Payer: BC Managed Care – PPO | Admitting: Family Medicine

## 2020-12-03 ENCOUNTER — Other Ambulatory Visit: Payer: BC Managed Care – PPO

## 2021-01-05 ENCOUNTER — Ambulatory Visit: Payer: BC Managed Care – PPO | Admitting: Family Medicine

## 2021-02-22 NOTE — Progress Notes (Signed)
Phone [phone] In person visit   Subjective:   Marc Ponce is a 65 y.o. year old very pleasant male patient who presents for/with See problem oriented charting Chief Complaint  Patient presents with   Follow-up   Insomnia   This visit occurred during the SARS-CoV-2 public health emergency.  Safety protocols were in place, including screening questions prior to the visit, additional usage of staff PPE, and extensive cleaning of exam room while observing appropriate contact time as indicated for disinfecting solutions.   Past Medical History-  Patient Active Problem List   Diagnosis Date Noted   Morbid obesity ([place]) 07/10/2013    Priority: High   Alcohol abuse, daily use 04/19/2011    Priority: High   GERD (gastroesophageal reflux disease) 12/26/2017    Priority: Medium    Elevated LFTs 04/21/2017    Priority: Medium    HTN (hypertension) 03/12/2013    Priority: Medium    Hyperlipidemia 03/12/2013    Priority: Medium    Steatohepatitis 04/19/2011    Priority: Medium    History of cellulitis 12/26/2017    Priority: Low   History of arthroplasty of left knee 05/26/2017    Priority: Low   Pain in left knee 05/10/2017    Priority: Low    Medications- reviewed and updated Current Outpatient Medications  Medication Sig Dispense Refill   augmented betamethasone dipropionate (DIPROLENE-AF) 0.05 % cream Apply topically as needed. 50 g 2   lansoprazole (PREVACID) 30 MG capsule Take 30 mg by mouth daily at 12 noon.     losartan-hydrochlorothiazide (HYZAAR) 100-12.5 MG tablet TAKE 1 TABLET BY MOUTH EVERY DAY 90 tablet 3   metoprolol succinate (TOPROL-XL) 50 MG 24 hr tablet TAKE 1 TABLET (50 MG TOTAL) BY MOUTH DAILY. TAKE WITH OR IMMEDIATELY FOLLOWING A MEAL. 90 tablet 3   Probiotic Product (PROBIOTIC DAILY PO) Take by mouth.     No current facility-administered medications for this visit.     Objective:  BP 138/72   Pulse 72   Temp 98.5 F (36.9 C)   Ht 5' 7.01"  (1.702 m)   Wt 223 lb 9.6 oz (101.4 kg)   SpO2 98%   BMI 35.01 kg/m  Gen: NAD, resting comfortably CV: RRR no murmurs rubs or gallops Lungs: CTAB no crackles, wheeze, rhonchi Abdomen: soft/nontender/nondistended/normal bowel sounds. No rebound or guarding.  Ext: 1+  edema Skin: warm, dry    Assessment and Plan    # hearing loss- intermittent issues over the last year. Had sensation of right eustachian tube being clogged- sometimes slightly better when clears throat. Referred to ENT for their expertise given ongoing issues in past - also getting some throat congestion- hed like to discuss that with ENT as well as very thick- even despite prevacid for reflux    #hypertension S: medication: Hyzaar 100-12.5Mg everyday, Metoprolol 50Mg daily XR BP Readings from Last 3 Encounters:  03/01/21 138/72  06/25/20 (!) 142/70  05/28/20 130/72  A/P:  high normal but acceptable- continue current meds   # GERD S:Medication: Prevacid 30Mg at noon A/P: reasonably well controlled other than possibly phlegm issues but could be from sinuses- getting ENT opinoin   #hyperlipidemia S: Medication:None-holding off with LFT elevation.  10-year ASCVD risk of 20.5% Lab Results  Component Value Date   CHOL 224 (H) 08/15/2019   HDL 35.50 (L) 08/15/2019   LDLCALC 79 04/15/2017   LDLDIRECT 123.0 08/15/2019   TRIG 277.0 (H) 08/15/2019   CHOLHDL 6 08/15/2019  A/P: poor control - hesitantat to start statin unless LFTs improve- hoping some improvement with cutting down on alcohol  Lab Results  Component Value Date   ALT 55 (H) 03/05/2020   AST 118 (H) 03/05/2020   ALKPHOS 124 (H) 08/15/2019   BILITOT 6.3 (H) 03/05/2020   #Elevated LFTs/obesity/alcohol dependence (improved)- S: concerned for alcohol-related liver damage.   - ultrasound was never set up -last visit was down to 3 glasses of wine per day from 6.  - has been trying to cut back on alcohol- some days avoiding completely or max 2 per day if  does drink. Wine and hard liquor out of house- doing cold fruity seltzer with lower alcohol intake -weight is going up some (12 lbs from February)- goal is to be under 200 within a year.  A/P: will monitor LFTs today- encouraged him to call to schedule ultrasound -encouraged him to consider AA and continue cutting back or out alcohol   #insomnia- trying 5 mg melatonin but having trouble sleeping (was getting some nightmares). Sleep hygiene- advised trying 1.25 mg or 2.5 mg.   # slight anemia-  noted some fatigue and some pallor to skin. Made sure anemia was not worsened. If it was considered hematology consult since colonoscopy reassured- will trend again today Lab Results  Component Value Date   WBC 5.5 03/05/2020   HGB 12.3 (L) 03/05/2020   HCT 33.8 (L) 03/05/2020   MCV 96.6 03/05/2020   PLT 152 03/05/2020   # Hyperglycemia/insulin resistance/prediabetes S:  Medication: none Exercise and diet- has not been the best lately Lab Results  Component Value Date   HGBA1C 5.0 03/05/2020   HGBA1C 6.0 08/15/2019   HGBA1C 5.7 (H) 04/15/2017   A/P: hopefully stable- update a1c today. Continue without meds for now  #BMI >35 with htn, hld- morbid obesity- Encouraged need for healthy eating, regular exercise, weight loss.    Recommended follow up: Return in about 4 months (around 06/29/2021) for physical or sooner if needed.  Lab/Order associations:   ICD-10-CM   1. Essential hypertension  I10     2. Elevated LFTs  R79.89 Comprehensive metabolic panel    3. Mixed hyperlipidemia  E78.2 CBC with Differential/Platelet    Comprehensive metabolic panel    Lipid panel    4. Hyperglycemia  R73.9 Hemoglobin A1c    5. Morbid obesity ([place])  E66.01     6. Need for immunization against influenza  Z23     7. Hearing loss, unspecified hearing loss type, unspecified laterality  H91.90 Ambulatory referral to ENT    8. Throat congestion  R68.89 Ambulatory referral to ENT      No orders of the  defined types were placed in this encounter.   I,Jada Bradford,acting as a scribe for Garret Reddish, MD.,have documented all relevant documentation on the behalf of Garret Reddish, MD,as directed by  Garret Reddish, MD while in the presence of Garret Reddish, MD.  I, Garret Reddish, MD, have reviewed all documentation for this visit. The documentation on 03/01/21 for the exam, diagnosis, procedures, and orders are all accurate and complete.  Return precautions advised.  Garret Reddish, MD

## 2021-03-01 ENCOUNTER — Other Ambulatory Visit: Payer: Self-pay

## 2021-03-01 ENCOUNTER — Encounter: Payer: Self-pay | Admitting: Family Medicine

## 2021-03-01 ENCOUNTER — Ambulatory Visit: Payer: BC Managed Care – PPO | Admitting: Family Medicine

## 2021-03-01 VITALS — BP 138/72 | HR 72 | Temp 98.5°F | Ht 67.01 in | Wt 223.6 lb

## 2021-03-01 DIAGNOSIS — R6889 Other general symptoms and signs: Secondary | ICD-10-CM

## 2021-03-01 DIAGNOSIS — I10 Essential (primary) hypertension: Secondary | ICD-10-CM

## 2021-03-01 DIAGNOSIS — R739 Hyperglycemia, unspecified: Secondary | ICD-10-CM | POA: Diagnosis not present

## 2021-03-01 DIAGNOSIS — Z23 Encounter for immunization: Secondary | ICD-10-CM | POA: Diagnosis not present

## 2021-03-01 DIAGNOSIS — R7989 Other specified abnormal findings of blood chemistry: Secondary | ICD-10-CM | POA: Diagnosis not present

## 2021-03-01 DIAGNOSIS — E782 Mixed hyperlipidemia: Secondary | ICD-10-CM

## 2021-03-01 DIAGNOSIS — H9190 Unspecified hearing loss, unspecified ear: Secondary | ICD-10-CM

## 2021-03-01 NOTE — Patient Instructions (Addendum)
Health Maintenance Due  Topic Date Due   Pneumococcal Vaccine 28-64 Years old (1 - PCV)- consider in the next year or so Never done   COVID-19 Vaccine (3 - Pfizer risk series)- recommend new bivalent booster at pharmacy 09/03/2019   INFLUENZA VACCINE - today 11/30/2020   We will call you within two weeks about your referral to ENT. If you do not hear within 2 weeks, give [place] a call.   Schedule a lab visit at the check out desk within 2 weeks. Return for future fasting labs meaning nothing but water after midnight please. Ok to take your medications with water.   Try melatonin 1.25- 2.5 mg 30 mins before bed at least- no screens after this time.   Goal at least 3 lbs down by 4 month physical   Consider AA  Call [facility] back- need to get set up for ultrasound again  Recommended follow up: Return in about 4 months (around 06/29/2021) for physical or sooner if needed.

## 2021-07-25 ENCOUNTER — Other Ambulatory Visit: Payer: Self-pay | Admitting: Family Medicine

## 2021-09-20 ENCOUNTER — Ambulatory Visit (INDEPENDENT_AMBULATORY_CARE_PROVIDER_SITE_OTHER): Payer: BC Managed Care – PPO | Admitting: Family Medicine

## 2021-09-20 ENCOUNTER — Encounter: Payer: Self-pay | Admitting: Family Medicine

## 2021-09-20 VITALS — BP 132/60 | HR 64 | Temp 98.7°F | Ht 67.0 in | Wt 218.0 lb

## 2021-09-20 DIAGNOSIS — Z125 Encounter for screening for malignant neoplasm of prostate: Secondary | ICD-10-CM

## 2021-09-20 DIAGNOSIS — Z0000 Encounter for general adult medical examination without abnormal findings: Secondary | ICD-10-CM

## 2021-09-20 DIAGNOSIS — E782 Mixed hyperlipidemia: Secondary | ICD-10-CM

## 2021-09-20 DIAGNOSIS — I10 Essential (primary) hypertension: Secondary | ICD-10-CM | POA: Diagnosis not present

## 2021-09-20 DIAGNOSIS — K7581 Nonalcoholic steatohepatitis (NASH): Secondary | ICD-10-CM

## 2021-09-20 DIAGNOSIS — R7989 Other specified abnormal findings of blood chemistry: Secondary | ICD-10-CM

## 2021-09-20 DIAGNOSIS — R739 Hyperglycemia, unspecified: Secondary | ICD-10-CM

## 2021-09-20 DIAGNOSIS — Z79899 Other long term (current) drug therapy: Secondary | ICD-10-CM

## 2021-09-20 MED ORDER — METOPROLOL SUCCINATE ER 50 MG PO TB24: ORAL_TABLET | ORAL | 3 refills | Status: AC

## 2021-09-20 MED ORDER — LOSARTAN POTASSIUM-HCTZ 100-12.5 MG PO TABS: 1.0000 | ORAL_TABLET | Freq: Every day | ORAL | 3 refills | Status: DC

## 2021-09-20 NOTE — Addendum Note (Signed)
Addended by: Clyde Lundborg A on: 09/20/2021 02:46 PM   Modules accepted: Orders

## 2021-09-20 NOTE — Patient Instructions (Addendum)
Please stop by lab before you go If you have mychart- we will send your results within 3 business days of [place] receiving them.  If you do not have mychart- we will call you about results within 5 business days of [place] receiving them.  *please also note that you will see labs on mychart as soon as they post. I will later go in and write notes on them- will say "notes from Dr. Yong Channel"   We need to cut down alcohol completely to protect the liver. Recommend considering AA ( I know your use is not extreme at present but I am very worried about your liver health)  Recommended follow up: Return in about 4 months (around 01/21/2022) for followup or sooner if needed.Schedule b4 you leave. To follow liver closely

## 2021-09-20 NOTE — Progress Notes (Signed)
Phone: [phone]   Subjective:  Patient presents today for their annual physical. Chief complaint-noted.   See problem oriented charting- ROS- full  review of systems was completed and negative  except for: congestion, nosebleeds, hearing loss, sinus pressure  The following were reviewed and entered/updated in epic: Past Medical History:  Diagnosis Date   Allergy    Arthritis    Diverticulitis    GERD (gastroesophageal reflux disease)    History of chicken pox    Hypertension    Peri-rectal abscess    Rectal pain    Patient Active Problem List   Diagnosis Date Noted   Alcohol abuse, daily use 04/19/2011    Priority: High   GERD (gastroesophageal reflux disease) 12/26/2017    Priority: Medium    Elevated LFTs 04/21/2017    Priority: Medium    HTN (hypertension) 03/12/2013    Priority: Medium    Hyperlipidemia 03/12/2013    Priority: Medium    Steatohepatitis 04/19/2011    Priority: Medium    History of cellulitis 12/26/2017    Priority: Low   History of arthroplasty of left knee 05/26/2017    Priority: Low   Pain in left knee 05/10/2017    Priority: Low   Past Surgical History:  Procedure Laterality Date   arthoscopic surgery on Left Knee  04/10/2014   fatty tumor removed Left    left side of face   FRACTURE SURGERY     left shoulder x 6 yrs ago and left x 42 yrs ago femur -knee cap   HEMORRHOID BANDING     OTHER SURGICAL HISTORY     thought parotid tumor- when dissected was fatty tissue per patient- may have been lipoma   plate insertion  4709, 2011   humerus 1977, femur 2011   [facility] Left 2019   Dr. Alvan Dame. [facility]   SHOULDER SURGERY  01/12/2010   Left     Family History  Problem Relation Age of Onset   Dementia Mother    Arthritis Mother    Heart disease Father 80       MI    Hypertension Father    Hyperlipidemia Father    COPD Father    Diabetes Father    Hearing loss Father    Heart attack Father    Kidney disease  Father    Heart disease Maternal Grandmother    Arthritis Maternal Grandmother    Heart disease Maternal Grandfather    Lung cancer Maternal Grandfather    Heart disease Paternal Grandmother    Stroke Paternal Grandmother    Leukemia Paternal Grandfather    Colon cancer Neg Hx    Colon polyps Neg Hx    Stomach cancer Neg Hx    Esophageal cancer Neg Hx     Medications- reviewed and updated Current Outpatient Medications  Medication Sig Dispense Refill   augmented betamethasone dipropionate (DIPROLENE-AF) 0.05 % cream Apply topically as needed. 50 g 2   lansoprazole (PREVACID) 30 MG capsule Take 30 mg by mouth daily at 12 noon.     losartan-hydrochlorothiazide (HYZAAR) 100-12.5 MG tablet TAKE 1 TABLET BY MOUTH EVERY DAY 90 tablet 3   metoprolol succinate (TOPROL-XL) 50 MG 24 hr tablet TAKE 1 TABLET BY MOUTH DAILY. TAKE WITH OR IMMEDIATELY FOLLOWING A MEAL. 90 tablet 3   Probiotic Product (PROBIOTIC DAILY PO) Take by mouth.     No current facility-administered medications for this visit.    Allergies-reviewed and updated No Known Allergies  Social History   Social History Narrative   Married. Adopted son of prior GF who died in fire, 2 step daughters.       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 Even in [place] driving to [place].       Hobbies: photography, site seeing, auto racing   Objective  Objective:  BP 132/60   Pulse 64   Temp 98.7 F (37.1 C)   Ht 5' 7"  (1.702 m)   Wt 218 lb (98.9 kg)   SpO2 97%   BMI 34.14 kg/m  Gen: NAD, resting comfortably HEENT: Mucous membranes are moist. Oropharynx normal Neck: no thyromegaly CV: RRR no murmurs rubs or gallops Lungs: CTAB no crackles, wheeze, rhonchi Abdomen: soft/nontender/nondistended/normal bowel sounds. No rebound or guarding.  Ext: trace edema Skin:  warm, dry Neuro: grossly normal, moves all extremities, PERRLA    Assessment and Plan  66 y.o. male presenting for annual physical.  Health Maintenance counseling: 1. Anticipatory guidance: Patient counseled regarding regular dental exams -q6 months, eye exams -yearly,  avoiding smoking and second hand smoke , limiting alcohol to 2 beverages per day - see below, no illicit drugs.   2. Risk factor reduction:  Advised patient of need for regular exercise and diet rich and fruits and vegetables to reduce risk of heart attack and stroke.  Exercise- not at present other than yardwork- encouraged to increase- discussed considering walking.  - has lost several friends/associates in last year Diet/weight management-has done a great job overall with weight loss-Down from 234 in 2020 and no longer in morbid obesity rangeDown from 234 in 2020 and no longer in morbid obesity range-has had some fluctuations though-that he has restarted his journey since October.   Wt Readings from Last 3 Encounters:  09/20/21 218 lb (98.9 kg)  03/01/21 223 lb 9.6 oz (101.4 kg)  06/25/20 211 lb 9.6 oz (96 kg)  3. Immunizations/screenings/ancillary studies-declines further COVID-19 vaccination  Immunization History  Administered Date(s) Administered   Influenza Inj Mdck Quad Pf 02/16/2018   Influenza,inj,Quad PF,6+ Mos 06/27/2015, 01/09/2016, 03/01/2021   Influenza,inj,quad, With Preservative 02/16/2018   Influenza-Unspecified 01/31/2018   PFIZER(Purple Top)SARS-COV-2 Vaccination 07/15/2019, 08/06/2019, 05/11/2020   Tdap 06/27/2015   Zoster Recombinat (Shingrix) 04/16/2018, 07/17/2018   4. Prostate cancer screening- trend psa with labs  Lab Results  Component Value Date   PSA 0.20 08/15/2019   PSA 2.62 04/16/2018   PSA 0.22 06/27/2015   5. Colon cancer screening - #History adenomatous polyp of the colon as well as sessile serrated polyp 02/28/2020-repeat 3 years  6. Skin cancer screening-has seen dermatology in the  past but only once (reported good report) advised regular sunscreen use. Denies worrisome, changing, or new skin lesions.  7. Smoking associated screening (lung cancer screening, AAA screen 65-75, UA)- former smoker- quit 1980s- consider AAA screen next year 8. STD screening - only active with wife   Status of chronic or acute concerns   #looking at teaching aviation potentially  # dental infection- on augmentin and prednisone through oral surgery- had CT scan around portion of bone graft- but they actually saw a shadow of sinuses on the left side- Dr. Benjamine Mola reviewed scan and started him on antibiotic -ongoing congestion issues trying to work with Dr. Benjamine Mola  #mild hearing loss- considering aids but leaning against it with audiology  #  hypertension S: medication: Losartan hydrochlorothiazide 100-12.5 mg, metoprolol 50 mg extended release BP Readings from Last 3 Encounters:  09/20/21 132/60  03/01/21 138/72  06/25/20 (!) 142/70  A/P: Controlled. Continue current medications.   #hyperlipidemia S: Medication:None-hesitant to start statin with elevated LFTs Lab Results  Component Value Date   CHOL 224 (H) 08/15/2019   HDL 35.50 (L) 08/15/2019   LDLCALC 79 04/15/2017   LDLDIRECT 123.0 08/15/2019   TRIG 277.0 (H) 08/15/2019   CHOLHDL 6 08/15/2019   A/P: update lipid panel- unlikely to start statin until we sort through liver issues     # GERD S:Medication: Lansoprazole 30 mg A/P: reasonable control- continue current meds   #Fatty liver  #alcohol use history (got rid of hard liquor, cut down on wine- some periods of not drinking, 2-3 glasses of wine per day at present)-update LFTs and continue to work on weight loss -We have ordered ultrasound of the liver several times-stressed importance again today at this and risk for cancer/worsening liver function- ordered again today  - was told years ago had significant gall bladder sludge- was some consideration of removal- does get mild  pain    Latest Ref Rng & Units 03/05/2020   11:06 AM 08/15/2019    3:59 PM 04/16/2018    8:41 AM  Hepatic Function  Total Protein 6.1 - 8.1 g/dL 7.1   7.3   7.3    Albumin 3.5 - 5.2 g/dL  3.5   4.0    AST 10 - 35 U/L 118   77   57    ALT 9 - 46 U/L 55   38   46    Alk Phosphatase 39 - 117 U/L  124   86    Total Bilirubin 0.2 - 1.2 mg/dL 6.3   2.8   1.4      # Hyperglycemia/insulin resistance/prediabetes S:  Medication: none Lab Results  Component Value Date   HGBA1C 5.0 03/05/2020   HGBA1C 6.0 08/15/2019   HGBA1C 5.7 (H) 04/15/2017   A/P: update a1c with labs  Recommended follow up: Return in about 4 months (around 01/21/2022) for followup or sooner if needed.Schedule b4 you leave.  Lab/Order associations:NOT fasting   ICD-10-CM   1. Preventative health care  Z00.00 CBC with Differential/Platelet    Comprehensive metabolic panel    Lipid panel    B12    PSA    US Abdomen Limited RUQ (LIVER/GB)    Hemoglobin A1c    2. Primary hypertension  I10 CBC with Differential/Platelet    Comprehensive metabolic panel    Lipid panel    3. Mixed hyperlipidemia  E78.2 CBC with Differential/Platelet    Comprehensive metabolic panel    Lipid panel    4. Steatohepatitis  K75.81 US Abdomen Limited RUQ (LIVER/GB)    5. Elevated LFTs  R79.89 US Abdomen Limited RUQ (LIVER/GB)    6. Screening for prostate cancer  Z12.5 PSA    7. High risk medication use  Z79.899 B12    8. Hyperglycemia  R73.9 Hemoglobin A1c      No orders of the defined types were placed in this encounter.   Return precautions advised.  Garret Reddish, MD

## 2022-01-21 ENCOUNTER — Ambulatory Visit: Payer: BC Managed Care – PPO | Admitting: Family Medicine

## 2022-01-24 ENCOUNTER — Encounter: Payer: Self-pay | Admitting: *Deleted

## 2022-02-11 ENCOUNTER — Other Ambulatory Visit: Payer: Self-pay

## 2022-02-11 ENCOUNTER — Emergency Department (HOSPITAL_BASED_OUTPATIENT_CLINIC_OR_DEPARTMENT_OTHER)
Admission: EM | Admit: 2022-02-11 | Discharge: 2022-02-11 | Disposition: A | Discharge status: Home / Self Care | Payer: Medicare Other | Attending: Emergency Medicine | Admitting: Emergency Medicine

## 2022-02-11 ENCOUNTER — Encounter (HOSPITAL_BASED_OUTPATIENT_CLINIC_OR_DEPARTMENT_OTHER): Payer: Self-pay

## 2022-02-11 DIAGNOSIS — Z79899 Other long term (current) drug therapy: Secondary | ICD-10-CM | POA: Diagnosis not present

## 2022-02-11 DIAGNOSIS — I10 Essential (primary) hypertension: Secondary | ICD-10-CM | POA: Insufficient documentation

## 2022-02-11 DIAGNOSIS — M5441 Lumbago with sciatica, right side: Secondary | ICD-10-CM

## 2022-02-11 DIAGNOSIS — M545 Low back pain, unspecified: Secondary | ICD-10-CM | POA: Diagnosis present

## 2022-02-11 LAB — URINALYSIS, ROUTINE W REFLEX MICROSCOPIC
Glucose, UA: NEGATIVE mg/dL
Hgb urine dipstick: NEGATIVE
Ketones, ur: NEGATIVE mg/dL
Leukocytes,Ua: NEGATIVE
Nitrite: NEGATIVE
Protein, ur: NEGATIVE mg/dL
Specific Gravity, Urine: 1.019 (ref 1.005–1.030)
pH: 5.5 (ref 5.0–8.0)

## 2022-02-11 MED ORDER — PREDNISONE 20 MG PO TABS: 40.0000 mg | ORAL_TABLET | Freq: Every day | ORAL | 0 refills | Status: DC

## 2022-02-11 MED ORDER — METHOCARBAMOL 500 MG PO TABS: 500.0000 mg | ORAL_TABLET | Freq: Three times a day (TID) | ORAL | 0 refills | Status: DC | PRN

## 2022-02-11 MED ORDER — KETOROLAC TROMETHAMINE 15 MG/ML IJ SOLN
15.0000 mg | Freq: Once | INTRAMUSCULAR | Status: AC
  Administered 2022-02-11: 15 mg via INTRAMUSCULAR
  Filled 2022-02-11: qty 1

## 2022-02-11 MED ORDER — PREDNISONE 10 MG PO TABS
60.0000 mg | ORAL_TABLET | Freq: Once | ORAL | Status: AC
  Administered 2022-02-11: 60 mg via ORAL
  Filled 2022-02-11: qty 1

## 2022-02-11 NOTE — ED Triage Notes (Signed)
Patient here POV from Home.  Endorses Right Lower Back Pain for approximately 2 Days. Radiates down Buttock. Has attempted Muscle Relaxer's but finds the Pain returns quickly.   No Dysuria/Hematuria. No Known Trauma.   NAD Noted during Triage. A&Ox4. GCS 15. Ambulatory.

## 2022-02-11 NOTE — ED Provider Notes (Signed)
[facility] Provider Note   CSN: [id number] Arrival date & time: 02/11/22  1625     History  Chief Complaint  Patient presents with   Back Pain    Marc Ponce is a 66 y.o. male.   Back Pain Patient presents with right-sided low back pain going leg.  Has had for around 2 days.  States it began after he woke up after falling asleep on the bed with his legs hanging off.  Worse with certain movements.  Better with just sitting still.  No loss of bladder bowel control.  States he has had a little bit of urinary issues but it was because he could not get up fast enough to go to the bathroom due to the pain.  States he feels as if he is guarding moving the right leg and it hurts to move it although does not necessarily feel weak.  No fevers or chills.  No cancer history.  No IV drug use history    Past Medical History:  Diagnosis Date   Allergy    Arthritis    Diverticulitis    GERD (gastroesophageal reflux disease)    History of chicken pox    Hypertension    Peri-rectal abscess    Rectal pain     Home Medications Prior to Admission medications   Medication Sig Start Date End Date Taking? Authorizing Provider  methocarbamol (ROBAXIN) 500 MG tablet Take 1 tablet (500 mg total) by mouth every 8 (eight) hours as needed for muscle spasms. 02/11/22  Yes Davonna Belling, MD  predniSONE (DELTASONE) 20 MG tablet Take 2 tablets (40 mg total) by mouth daily. First dose on October 14. 02/11/22  Yes Davonna Belling, MD  augmented betamethasone dipropionate (DIPROLENE-AF) 0.05 % cream Apply topically as needed. 11/26/19   Lavonna Monarch, MD  lansoprazole (PREVACID) 30 MG capsule Take 30 mg by mouth daily at 12 noon.    [provider]  losartan-hydrochlorothiazide (HYZAAR) 100-12.5 MG tablet Take 1 tablet by mouth daily. 09/20/21   [name], MD  metoprolol succinate (TOPROL-XL) 50 MG 24 hr tablet TAKE 1 TABLET BY MOUTH DAILY. TAKE WITH OR  IMMEDIATELY FOLLOWING A MEAL. 09/20/21   [name], MD  Probiotic Product (PROBIOTIC DAILY PO) Take by mouth.    [provider]      Allergies    Patient has no known allergies.    Review of Systems   Review of Systems  Musculoskeletal:  Positive for back pain.    Physical Exam Updated Vital Signs BP (!) 130/53 (BP Location: Right Arm)   Pulse 70   Temp 98.3 F (36.8 C) (Oral)   Resp 18   Ht 5' 7"  (1.702 m)   Wt 98.9 kg   SpO2 98%   BMI 34.15 kg/m  Physical Exam Vitals and nursing note reviewed.  HENT:     Head: Atraumatic.  Cardiovascular:     Rate and Rhythm: Regular rhythm.  Abdominal:     Tenderness: There is no abdominal tenderness.  Musculoskeletal:        General: Tenderness present.     Cervical back: Neck supple.     Comments: Tenderness in the right lumbar paraspinal area and little bit lower to the SI area with muscle spasm.  No rash.  Pain with straight leg raise on right.  Neurological:     Mental Status: He is alert.     ED Results / Procedures / Treatments   Labs (  all labs ordered are listed, but only abnormal results are displayed) Labs Reviewed  URINALYSIS, ROUTINE W REFLEX MICROSCOPIC - Abnormal; Notable for the following components:      Result Value   Bilirubin Urine SMALL (*)    All other components within normal limits    EKG None  Radiology No results found.  Procedures Procedures    Medications Ordered in ED Medications  ketorolac (TORADOL) 15 MG/ML injection 15 mg (15 mg Intramuscular Given 02/11/22 1726)  predniSONE (DELTASONE) tablet 60 mg (60 mg Oral Given 02/11/22 1726)    ED Course/ Medical Decision Making/ A&P                           Medical Decision Making Amount and/or Complexity of Data Reviewed Labs: ordered.  Risk Prescription drug management.   Patient with low back pain.  Differential diagnoses long includes infection, radiculopathy, muscle pain began after awkward positioning  sleeping with his feet hanging off the bed.  Muscle relaxers help at home but only for about 3 hours.  We will give shot of Toradol here and some steroids to help.  Will refill Robaxin and give steroids also.  Outpatient follow-up with neurosurgery as needed.        Final Clinical Impression(s) / ED Diagnoses Final diagnoses:  Acute right-sided low back pain with right-sided sciatica    Rx / DC Orders ED Discharge Orders          Ordered    methocarbamol (ROBAXIN) 500 MG tablet  Every 8 hours PRN        02/11/22 1729    predniSONE (DELTASONE) 20 MG tablet  Daily        02/11/22 1729              Davonna Belling, MD 02/11/22 1729

## 2022-02-16 ENCOUNTER — Other Ambulatory Visit: Payer: Self-pay

## 2022-02-16 ENCOUNTER — Emergency Department (HOSPITAL_BASED_OUTPATIENT_CLINIC_OR_DEPARTMENT_OTHER): Payer: Medicare Other | Admitting: Radiology

## 2022-02-16 ENCOUNTER — Encounter (HOSPITAL_BASED_OUTPATIENT_CLINIC_OR_DEPARTMENT_OTHER): Payer: Self-pay | Admitting: Emergency Medicine

## 2022-02-16 ENCOUNTER — Inpatient Hospital Stay (HOSPITAL_BASED_OUTPATIENT_CLINIC_OR_DEPARTMENT_OTHER)
Admission: EM | Admit: 2022-02-16 | Discharge: 2022-02-28 | DRG: 814 | Disposition: A | Discharge status: Home Health | Payer: Medicare Other | Attending: Internal Medicine | Admitting: Internal Medicine

## 2022-02-16 ENCOUNTER — Emergency Department (HOSPITAL_BASED_OUTPATIENT_CLINIC_OR_DEPARTMENT_OTHER): Payer: Medicare Other

## 2022-02-16 DIAGNOSIS — K766 Portal hypertension: Secondary | ICD-10-CM | POA: Diagnosis present

## 2022-02-16 DIAGNOSIS — F1010 Alcohol abuse, uncomplicated: Secondary | ICD-10-CM | POA: Diagnosis present

## 2022-02-16 DIAGNOSIS — M4626 Osteomyelitis of vertebra, lumbar region: Secondary | ICD-10-CM | POA: Diagnosis not present

## 2022-02-16 DIAGNOSIS — Z87891 Personal history of nicotine dependence: Secondary | ICD-10-CM | POA: Diagnosis not present

## 2022-02-16 DIAGNOSIS — K651 Peritoneal abscess: Secondary | ICD-10-CM | POA: Diagnosis not present

## 2022-02-16 DIAGNOSIS — E871 Hypo-osmolality and hyponatremia: Secondary | ICD-10-CM | POA: Diagnosis not present

## 2022-02-16 DIAGNOSIS — I10 Essential (primary) hypertension: Secondary | ICD-10-CM | POA: Diagnosis present

## 2022-02-16 DIAGNOSIS — K5730 Diverticulosis of large intestine without perforation or abscess without bleeding: Secondary | ICD-10-CM | POA: Diagnosis not present

## 2022-02-16 DIAGNOSIS — K8020 Calculus of gallbladder without cholecystitis without obstruction: Secondary | ICD-10-CM | POA: Diagnosis not present

## 2022-02-16 DIAGNOSIS — D696 Thrombocytopenia, unspecified: Secondary | ICD-10-CM

## 2022-02-16 DIAGNOSIS — K7460 Unspecified cirrhosis of liver: Secondary | ICD-10-CM | POA: Diagnosis not present

## 2022-02-16 DIAGNOSIS — R6520 Severe sepsis without septic shock: Secondary | ICD-10-CM | POA: Diagnosis not present

## 2022-02-16 DIAGNOSIS — K2211 Ulcer of esophagus with bleeding: Secondary | ICD-10-CM | POA: Diagnosis not present

## 2022-02-16 DIAGNOSIS — K2210 Ulcer of esophagus without bleeding: Secondary | ICD-10-CM

## 2022-02-16 DIAGNOSIS — R188 Other ascites: Secondary | ICD-10-CM | POA: Diagnosis not present

## 2022-02-16 DIAGNOSIS — K3189 Other diseases of stomach and duodenum: Secondary | ICD-10-CM

## 2022-02-16 DIAGNOSIS — K219 Gastro-esophageal reflux disease without esophagitis: Secondary | ICD-10-CM | POA: Diagnosis not present

## 2022-02-16 DIAGNOSIS — K7030 Alcoholic cirrhosis of liver without ascites: Secondary | ICD-10-CM | POA: Diagnosis present

## 2022-02-16 DIAGNOSIS — K7031 Alcoholic cirrhosis of liver with ascites: Secondary | ICD-10-CM | POA: Diagnosis not present

## 2022-02-16 DIAGNOSIS — T39395A Adverse effect of other nonsteroidal anti-inflammatory drugs [NSAID], initial encounter: Secondary | ICD-10-CM | POA: Diagnosis present

## 2022-02-16 DIAGNOSIS — K7682 Hepatic encephalopathy: Secondary | ICD-10-CM | POA: Diagnosis not present

## 2022-02-16 DIAGNOSIS — Z471 Aftercare following joint replacement surgery: Secondary | ICD-10-CM | POA: Diagnosis not present

## 2022-02-16 DIAGNOSIS — M6281 Muscle weakness (generalized): Secondary | ICD-10-CM | POA: Diagnosis not present

## 2022-02-16 DIAGNOSIS — I8510 Secondary esophageal varices without bleeding: Secondary | ICD-10-CM

## 2022-02-16 DIAGNOSIS — K254 Chronic or unspecified gastric ulcer with hemorrhage: Secondary | ICD-10-CM | POA: Diagnosis not present

## 2022-02-16 DIAGNOSIS — D735 Infarction of spleen: Secondary | ICD-10-CM | POA: Diagnosis not present

## 2022-02-16 DIAGNOSIS — S0990XA Unspecified injury of head, initial encounter: Secondary | ICD-10-CM | POA: Diagnosis not present

## 2022-02-16 DIAGNOSIS — Z96652 Presence of left artificial knee joint: Secondary | ICD-10-CM | POA: Diagnosis present

## 2022-02-16 DIAGNOSIS — D62 Acute posthemorrhagic anemia: Secondary | ICD-10-CM | POA: Diagnosis not present

## 2022-02-16 DIAGNOSIS — M5441 Lumbago with sciatica, right side: Secondary | ICD-10-CM | POA: Diagnosis present

## 2022-02-16 DIAGNOSIS — M4620 Osteomyelitis of vertebra, site unspecified: Secondary | ICD-10-CM

## 2022-02-16 DIAGNOSIS — K921 Melena: Secondary | ICD-10-CM | POA: Diagnosis not present

## 2022-02-16 DIAGNOSIS — R791 Abnormal coagulation profile: Secondary | ICD-10-CM | POA: Diagnosis present

## 2022-02-16 DIAGNOSIS — K5521 Angiodysplasia of colon with hemorrhage: Secondary | ICD-10-CM | POA: Diagnosis not present

## 2022-02-16 DIAGNOSIS — Z515 Encounter for palliative care: Secondary | ICD-10-CM

## 2022-02-16 DIAGNOSIS — D6959 Other secondary thrombocytopenia: Secondary | ICD-10-CM | POA: Diagnosis present

## 2022-02-16 DIAGNOSIS — Z79899 Other long term (current) drug therapy: Secondary | ICD-10-CM | POA: Diagnosis not present

## 2022-02-16 DIAGNOSIS — F10139 Alcohol abuse with withdrawal, unspecified: Secondary | ICD-10-CM | POA: Diagnosis not present

## 2022-02-16 DIAGNOSIS — E782 Mixed hyperlipidemia: Secondary | ICD-10-CM | POA: Diagnosis not present

## 2022-02-16 DIAGNOSIS — K922 Gastrointestinal hemorrhage, unspecified: Secondary | ICD-10-CM | POA: Diagnosis not present

## 2022-02-16 DIAGNOSIS — E785 Hyperlipidemia, unspecified: Secondary | ICD-10-CM | POA: Diagnosis present

## 2022-02-16 DIAGNOSIS — M4656 Other infective spondylopathies, lumbar region: Secondary | ICD-10-CM | POA: Diagnosis present

## 2022-02-16 DIAGNOSIS — R32 Unspecified urinary incontinence: Secondary | ICD-10-CM | POA: Diagnosis present

## 2022-02-16 DIAGNOSIS — Z043 Encounter for examination and observation following other accident: Secondary | ICD-10-CM | POA: Diagnosis not present

## 2022-02-16 DIAGNOSIS — D72829 Elevated white blood cell count, unspecified: Secondary | ICD-10-CM

## 2022-02-16 DIAGNOSIS — M5416 Radiculopathy, lumbar region: Secondary | ICD-10-CM | POA: Diagnosis not present

## 2022-02-16 DIAGNOSIS — R7989 Other specified abnormal findings of blood chemistry: Secondary | ICD-10-CM | POA: Diagnosis present

## 2022-02-16 DIAGNOSIS — Z66 Do not resuscitate: Secondary | ICD-10-CM | POA: Diagnosis not present

## 2022-02-16 DIAGNOSIS — J9811 Atelectasis: Secondary | ICD-10-CM | POA: Diagnosis not present

## 2022-02-16 DIAGNOSIS — M40204 Unspecified kyphosis, thoracic region: Secondary | ICD-10-CM | POA: Diagnosis not present

## 2022-02-16 DIAGNOSIS — A411 Sepsis due to other specified staphylococcus: Secondary | ICD-10-CM | POA: Diagnosis not present

## 2022-02-16 DIAGNOSIS — R7881 Bacteremia: Secondary | ICD-10-CM | POA: Diagnosis not present

## 2022-02-16 DIAGNOSIS — K2951 Unspecified chronic gastritis with bleeding: Secondary | ICD-10-CM | POA: Diagnosis not present

## 2022-02-16 DIAGNOSIS — Z7189 Other specified counseling: Secondary | ICD-10-CM | POA: Diagnosis not present

## 2022-02-16 DIAGNOSIS — K7581 Nonalcoholic steatohepatitis (NASH): Secondary | ICD-10-CM | POA: Diagnosis present

## 2022-02-16 DIAGNOSIS — R531 Weakness: Principal | ICD-10-CM

## 2022-02-16 DIAGNOSIS — D733 Abscess of spleen: Secondary | ICD-10-CM | POA: Diagnosis not present

## 2022-02-16 DIAGNOSIS — B957 Other staphylococcus as the cause of diseases classified elsewhere: Secondary | ICD-10-CM | POA: Diagnosis not present

## 2022-02-16 DIAGNOSIS — N179 Acute kidney failure, unspecified: Secondary | ICD-10-CM | POA: Diagnosis not present

## 2022-02-16 DIAGNOSIS — J90 Pleural effusion, not elsewhere classified: Secondary | ICD-10-CM | POA: Diagnosis not present

## 2022-02-16 DIAGNOSIS — Z83438 Family history of other disorder of lipoprotein metabolism and other lipidemia: Secondary | ICD-10-CM

## 2022-02-16 DIAGNOSIS — Z8249 Family history of ischemic heart disease and other diseases of the circulatory system: Secondary | ICD-10-CM

## 2022-02-16 DIAGNOSIS — I8511 Secondary esophageal varices with bleeding: Secondary | ICD-10-CM | POA: Diagnosis not present

## 2022-02-16 LAB — CBC WITH DIFFERENTIAL/PLATELET
Abs Immature Granulocytes: 0.5 10*3/uL — ABNORMAL HIGH (ref 0.00–0.07)
Basophils Absolute: 0.1 10*3/uL (ref 0.0–0.1)
Basophils Relative: 0 %
Eosinophils Absolute: 0 10*3/uL (ref 0.0–0.5)
Eosinophils Relative: 0 %
HCT: 36.9 % — ABNORMAL LOW (ref 39.0–52.0)
Hemoglobin: 12.4 g/dL — ABNORMAL LOW (ref 13.0–17.0)
Immature Granulocytes: 3 %
Lymphocytes Relative: 3 %
Lymphs Abs: 0.5 10*3/uL — ABNORMAL LOW (ref 0.7–4.0)
MCH: 33.1 pg (ref 26.0–34.0)
MCHC: 33.6 g/dL (ref 30.0–36.0)
MCV: 98.4 fL (ref 80.0–100.0)
Monocytes Absolute: 1.8 10*3/uL — ABNORMAL HIGH (ref 0.1–1.0)
Monocytes Relative: 10 %
Neutro Abs: 15.6 10*3/uL — ABNORMAL HIGH (ref 1.7–7.7)
Neutrophils Relative %: 84 %
Platelets: 148 10*3/uL — ABNORMAL LOW (ref 150–400)
RBC: 3.75 MIL/uL — ABNORMAL LOW (ref 4.22–5.81)
RDW: 18.5 % — ABNORMAL HIGH (ref 11.5–15.5)
WBC: 18.5 10*3/uL — ABNORMAL HIGH (ref 4.0–10.5)
nRBC: 0.1 % (ref 0.0–0.2)

## 2022-02-16 LAB — COMPREHENSIVE METABOLIC PANEL
ALT: 40 U/L (ref 0–44)
AST: 82 U/L — ABNORMAL HIGH (ref 15–41)
Albumin: 2.6 g/dL — ABNORMAL LOW (ref 3.5–5.0)
Alkaline Phosphatase: 156 U/L — ABNORMAL HIGH (ref 38–126)
Anion gap: 11 (ref 5–15)
BUN: 85 mg/dL — ABNORMAL HIGH (ref 8–23)
CO2: 21 mmol/L — ABNORMAL LOW (ref 22–32)
Calcium: 8.9 mg/dL (ref 8.9–10.3)
Chloride: 101 mmol/L (ref 98–111)
Creatinine, Ser: 2.05 mg/dL — ABNORMAL HIGH (ref 0.61–1.24)
GFR, Estimated: 35 mL/min — ABNORMAL LOW (ref >60)
Glucose, Bld: 164 mg/dL — ABNORMAL HIGH (ref 70–99)
Potassium: 4.4 mmol/L (ref 3.5–5.1)
Sodium: 133 mmol/L — ABNORMAL LOW (ref 135–145)
Total Bilirubin: 8 mg/dL — ABNORMAL HIGH (ref 0.3–1.2)
Total Protein: 7.2 g/dL (ref 6.5–8.1)

## 2022-02-16 LAB — MAGNESIUM: Magnesium: 2.8 mg/dL — ABNORMAL HIGH (ref 1.7–2.4)

## 2022-02-16 LAB — URINALYSIS, ROUTINE W REFLEX MICROSCOPIC
Bilirubin Urine: NEGATIVE
Glucose, UA: NEGATIVE mg/dL
Ketones, ur: NEGATIVE mg/dL
Leukocytes,Ua: NEGATIVE
Nitrite: NEGATIVE
Protein, ur: NEGATIVE mg/dL
Specific Gravity, Urine: 1.016 (ref 1.005–1.030)
pH: 5.5 (ref 5.0–8.0)

## 2022-02-16 LAB — AMMONIA: Ammonia: 86 umol/L — ABNORMAL HIGH (ref 9–35)

## 2022-02-16 LAB — PROTIME-INR
INR: 1.8 — ABNORMAL HIGH (ref 0.8–1.2)
Prothrombin Time: 21 seconds — ABNORMAL HIGH (ref 11.4–15.2)

## 2022-02-16 LAB — LACTIC ACID, PLASMA
Lactic Acid, Venous: 3.2 mmol/L (ref 0.5–1.9)
Lactic Acid, Venous: 2.5 mmol/L (ref 0.5–1.9)

## 2022-02-16 LAB — T4, FREE: Free T4: 0.86 ng/dL (ref 0.61–1.12)

## 2022-02-16 LAB — TSH: TSH: 0.59 u[IU]/mL (ref 0.350–4.500)

## 2022-02-16 MED ORDER — ACETAMINOPHEN 325 MG PO TABS
650.0000 mg | ORAL_TABLET | Freq: Four times a day (QID) | ORAL | Status: DC | PRN
  Administered 2022-02-21 (×2): 650 mg via ORAL
  Filled 2022-02-16 (×2): qty 2

## 2022-02-16 MED ORDER — METHOCARBAMOL 500 MG PO TABS: 500.0000 mg | ORAL_TABLET | Freq: Three times a day (TID) | ORAL | Status: DC | PRN

## 2022-02-16 MED ORDER — LACTATED RINGERS IV BOLUS
1000.0000 mL | Freq: Once | INTRAVENOUS | Status: AC
  Administered 2022-02-16: 1000 mL via INTRAVENOUS

## 2022-02-16 MED ORDER — HEPARIN BOLUS VIA INFUSION
3000.0000 [IU] | Freq: Once | INTRAVENOUS | Status: AC
  Administered 2022-02-16: 3000 [IU] via INTRAVENOUS

## 2022-02-16 MED ORDER — LIDOCAINE 5 % EX PTCH
1.0000 | MEDICATED_PATCH | CUTANEOUS | Status: DC
  Administered 2022-02-16 – 2022-02-27 (×8): 1 via TRANSDERMAL
  Filled 2022-02-16 (×10): qty 1

## 2022-02-16 MED ORDER — SODIUM CHLORIDE 0.9 % IV SOLN
2.0000 g | Freq: Once | INTRAVENOUS | Status: AC
  Administered 2022-02-16: 2 g via INTRAVENOUS
  Filled 2022-02-16: qty 12.5

## 2022-02-16 MED ORDER — MORPHINE SULFATE (PF) 4 MG/ML IV SOLN
4.0000 mg | Freq: Once | INTRAVENOUS | Status: AC
  Administered 2022-02-16: 4 mg via INTRAVENOUS
  Filled 2022-02-16: qty 1

## 2022-02-16 MED ORDER — METOPROLOL SUCCINATE ER 50 MG PO TB24
50.0000 mg | ORAL_TABLET | Freq: Every day | ORAL | Status: DC
  Administered 2022-02-17 – 2022-02-28 (×11): 50 mg via ORAL
  Filled 2022-02-16 (×11): qty 1

## 2022-02-16 MED ORDER — KETOROLAC TROMETHAMINE 30 MG/ML IJ SOLN
30.0000 mg | Freq: Once | INTRAMUSCULAR | Status: AC
  Administered 2022-02-16: 30 mg via INTRAVENOUS
  Filled 2022-02-16: qty 1

## 2022-02-16 MED ORDER — OXYCODONE HCL 5 MG PO TABS: 5.0000 mg | ORAL_TABLET | ORAL | Status: DC | PRN

## 2022-02-16 MED ORDER — SODIUM CHLORIDE 0.9 % IV SOLN: INTRAVENOUS | Status: AC

## 2022-02-16 MED ORDER — ADULT MULTIVITAMIN W/MINERALS CH: 1.0000 | ORAL_TABLET | Freq: Every day | ORAL | Status: DC

## 2022-02-16 MED ORDER — HEPARIN (PORCINE) 25000 UT/250ML-% IV SOLN
1700.0000 [IU]/h | INTRAVENOUS | Status: DC
  Administered 2022-02-16: 1400 [IU]/h via INTRAVENOUS
  Administered 2022-02-17: 1700 [IU]/h via INTRAVENOUS
  Filled 2022-02-16 (×2): qty 250

## 2022-02-16 MED ORDER — PANTOPRAZOLE SODIUM 40 MG PO TBEC
40.0000 mg | DELAYED_RELEASE_TABLET | Freq: Every day | ORAL | Status: DC
  Administered 2022-02-17 – 2022-02-18 (×2): 40 mg via ORAL
  Filled 2022-02-16 (×2): qty 1

## 2022-02-16 MED ORDER — THIAMINE HCL 100 MG/ML IJ SOLN
100.0000 mg | Freq: Once | INTRAMUSCULAR | Status: AC
  Administered 2022-02-16: 100 mg via INTRAVENOUS
  Filled 2022-02-16: qty 2

## 2022-02-16 MED ORDER — THIAMINE MONONITRATE 100 MG PO TABS: 100.0000 mg | ORAL_TABLET | Freq: Every day | ORAL | Status: DC

## 2022-02-16 MED ORDER — METRONIDAZOLE 500 MG/100ML IV SOLN
500.0000 mg | Freq: Once | INTRAVENOUS | Status: AC
  Administered 2022-02-16: 500 mg via INTRAVENOUS
  Filled 2022-02-16: qty 100

## 2022-02-16 MED ORDER — FOLIC ACID 1 MG PO TABS: 1.0000 mg | ORAL_TABLET | Freq: Every day | ORAL | Status: DC

## 2022-02-16 MED ORDER — SODIUM CHLORIDE 0.9% FLUSH
3.0000 mL | Freq: Two times a day (BID) | INTRAVENOUS | Status: DC
  Administered 2022-02-17 – 2022-02-28 (×20): 3 mL via INTRAVENOUS

## 2022-02-16 MED ORDER — HYDROMORPHONE HCL 1 MG/ML IJ SOLN: 0.5000 mg | INTRAMUSCULAR | Status: DC | PRN

## 2022-02-16 MED ORDER — LACTULOSE 10 GM/15ML PO SOLN
20.0000 g | Freq: Once | ORAL | Status: AC
  Administered 2022-02-16: 20 g via ORAL
  Filled 2022-02-16: qty 30

## 2022-02-16 MED ORDER — ACETAMINOPHEN 650 MG RE SUPP: 650.0000 mg | Freq: Four times a day (QID) | RECTAL | Status: DC | PRN

## 2022-02-16 MED ORDER — POLYETHYLENE GLYCOL 3350 17 G PO PACK: 17.0000 g | PACK | Freq: Every day | ORAL | Status: DC | PRN

## 2022-02-16 NOTE — ED Notes (Signed)
Pt back from CT scan

## 2022-02-16 NOTE — ED Triage Notes (Signed)
Seen on 02/11/22 dx sciatica  Given toradol  injection C/o pain in right shoulder pain since Now complains of increased pain in legs and weakness. Recent fall on sunday No pain sitting , reports pain 8-9 when moving legs Episodes of incontinence due not being able to get up fast enough

## 2022-02-16 NOTE — ED Notes (Signed)
EDP made aware of lactic acid and ammonia level

## 2022-02-16 NOTE — Progress Notes (Signed)
  [facility] will assume care on arrival to accepting facility. Until arrival, care as per EDP. However, [facility] available 24/7 for questions and assistance.   Nursing staff please page [place] and Consults [phone]) as soon as the patient arrives to the hospital.  Kristopher Oppenheim, DO [facility]

## 2022-02-16 NOTE — ED Provider Notes (Signed)
[facility] EMERGENCY DEPT Provider Note   CSN: [id number] Arrival date & time: 02/16/22  1512     History  Chief Complaint  Patient presents with   Shoulder Pain   Leg Pain    Marc Ponce is a 66 y.o. male.  With PMH of alcohol abuse, HTN, HLD, GERD who presents with worsening generalized fatigue and weakness in the setting of recent back strain seen in ED 02/11/2022.  Patient says he slept wrong in the bed the night before he was seen in the ED which led to right lower back pain and told he had sciatica. He went home on steroids and robaxin with improvement in back pain.  His pain has been worse with movement getting out of bed or moving around to the point where he has been staying in bed more and his wife has been assisting him to get around the house.  He typically drinks 2-1/2 bottles of liquor a day but since his pain has been out of control and he has not been getting out of bed he has been holding off from drinking.  He has not had any seizures or hallucinations.  There is no any focal weakness, loss of sensation or loss of bladder or bowel.  He has episodes of difficulty controlling his bladder because he it is too hard for him to get out of the bed but he knows when he needs to pee and can control going to the bathroom.  He has had no fecal incontinence.  He has had no fevers, no chest pain, no vomiting, no diarrhea or infectious symptoms.  No history of IV drug use or cancer.  Does have chronic long history of drinking a 40 years.  He also notes a recent fall in the dark when trying to go the bathroom landing on his left knee.  Denies any head trauma or loss of consciousness.  His wife has been trying to help him get in and out of bed but she says he needs more help.   Shoulder Pain Leg Pain      Home Medications Prior to Admission medications   Medication Sig Start Date End Date Taking? Authorizing Provider  augmented betamethasone dipropionate  (DIPROLENE-AF) 0.05 % cream Apply topically as needed. 11/26/19   Lavonna Monarch, MD  lansoprazole (PREVACID) 30 MG capsule Take 30 mg by mouth daily at 12 noon.    [provider]  losartan-hydrochlorothiazide (HYZAAR) 100-12.5 MG tablet Take 1 tablet by mouth daily. 09/20/21   [name], MD  methocarbamol (ROBAXIN) 500 MG tablet Take 1 tablet (500 mg total) by mouth every 8 (eight) hours as needed for muscle spasms. 02/11/22   Davonna Belling, MD  metoprolol succinate (TOPROL-XL) 50 MG 24 hr tablet TAKE 1 TABLET BY MOUTH DAILY. TAKE WITH OR IMMEDIATELY FOLLOWING A MEAL. 09/20/21   [name], MD  predniSONE (DELTASONE) 20 MG tablet Take 2 tablets (40 mg total) by mouth daily. First dose on October 14. 02/11/22   Davonna Belling, MD  Probiotic Product (PROBIOTIC DAILY PO) Take by mouth.    [provider]      Allergies    Patient has no known allergies.    Review of Systems   Review of Systems  Physical Exam Updated Vital Signs BP (!) 116/39   Pulse 82   Temp 97.7 F (36.5 C) (Oral)   Resp 18   SpO2 98%  Physical Exam Constitutional: Alert and oriented.  Chronically ill-appearing but  no acute distress Eyes: Scleral icterus ENT      Head: Normocephalic and atraumatic.      Nose: No congestion.      Mouth/Throat: Mucous membranes are moist.      Neck: No stridor. Cardiovascular: S1, S2, equal palpable radial pulses and DP pulses.Warm and well perfused. Respiratory: Normal respiratory effort. Breath sounds are normal.  O2 sat 98 on RA Gastrointestinal: Soft and distended but nontender  musculoskeletal: Right-sided lumbar paraspinal tenderness, no midline tenderness step-offs or deformities of the spine.  Normal range of motion in all extremities.      Right lower leg: No tenderness or edema.      Left lower leg: Tenderness to palpation of the anterior knee, no effusion, no bruising or abrasion.  No pitting edema.  Sensation grossly intact.  Full  strength intact  Neurologic: Normal speech and language. CN 2-12 grossly intact.  5 out of 5 bilateral upper extremity strength.  5 out of 5 bilateral hip flexor, knee flexor and extensor and foot dorsiflexion and plantarflexion with sensation grossly intact.  Mild tremor of upper extremities on extension.  Steady ambulatory gait with assistance.no gross focal neurologic deficits are appreciated. Skin: Skin is warm, dry and intact. No rash noted. Psychiatric: Mood and affect are normal. Speech and behavior are normal.  ED Results / Procedures / Treatments   Labs (all labs ordered are listed, but only abnormal results are displayed) Labs Reviewed - No data to display  EKG None  Radiology No results found.  Procedures .Critical Care  Performed by: Elgie Congo, MD Authorized by: Elgie Congo, MD   Critical care provider statement:    Critical care time (minutes):  35   Critical care was necessary to treat or prevent imminent or life-threatening deterioration of the following conditions:  Hepatic failure and renal failure   Critical care was time spent personally by me on the following activities:  Development of treatment plan with patient or surrogate, discussions with consultants, evaluation of patient's response to treatment, examination of patient, ordering and review of laboratory studies, ordering and review of radiographic studies, ordering and performing treatments and interventions, pulse oximetry, re-evaluation of patient's condition, review of old charts and obtaining history from patient or surrogate   Care discussed with: admitting provider       Medications Ordered in ED Medications - No data to display  ED Course/ Medical Decision Making/ A&P Clinical Course as of 02/16/22 2357  Wed Feb 16, 2022  1751 Reviewed patient's labs, he does have a leukocytosis 18.5 with left shift which could be related to his recent steroid use.  He is afebrile and not  endorsing any infectious symptoms but urine and CT chest and abdomen pending.  He has anemia which is stable 12.4 and thrombocytopenia 148 with elevated bilirubin 8.0 likely all from chronic alcohol use and suspected liver disease.  AST 82 with normal ALT 40.  He has a AKI creatinine 2.05 with BUN to creatinine ratio of approximately 40 likely from prerenal from dehydration from decreased p.o. intake and probable NSAID use for pain.  IV fluids ordered as well as blood cultures. [VB]  8110 Patient's lactate elevated 3.2 with elevated ammonia 86 and INR 1.8 concerning for developing cirrhosis especially with elevated AST and bilirubin.  With his AKI, concern for possible hepatorenal syndrome.  I suspect SBP is unlikely with no significant abdominal pain or fever however with his abnormalities and leukocytosis will cover with cefepime and Flagyl.  CT head I personally reviewed with no ICH or acute abnormality.  CT chest AP pending. [VB]  1912 CT chest abdomen pelvis showed evidence of cirrhosis with small volume ascites as well as splenic infarct small bilateral pleural effusions.  We will start heparin for concern for splenic infarct.  Consulting hospitalist for admission for further work-up and management. [VB]  [id number] Spoke with Dr Bridgett Larsson who has accepted patient for admission for further workup and management for new cirrhosis, renal dysfunction, splenic infarct. [VB]    Clinical Course User Index [VB] Elgie Congo, MD                           Medical Decision Making  JANTHONY HOLLEMAN is a 66 y.o. male.  With PMH of alcohol abuse, HTN, HLD, GERD who presents with worsening generalized fatigue and weakness in the setting of recent back strain seen in ED 02/11/2022.   EKG personally reviewed NSR obtained with short PR interval, no acute ST/T changes concerning for ischemia.  Patient's generalized fatigue and weakness carries multiple possible underlying etiologies including but not limited to acute  electrolyte abnormalities, vitamin deficiencies or liver cirrhosis or abnormalities especially in the setting of chronic long-term drinking among multiple other etiologies.  Unlikely CVA or stroke with no focal neurologic deficits.  No hypertension or tachycardia or hallucinations suggestive of severe alcohol withdrawal.  Regarding his right-sided lumbar paraspinal tenderness, more likely nonspecific musculoskeletal back pain and strain in the setting of's sleeping in the wrong position.  He has no midline tenderness step-offs or deformities and a neurologic exam with no focal deficits, do not suspect cauda equina, abscess or fracture but it will further be evaluated with CT scan.  Will obtain x-ray of left knee to ensure no acute fracture or dislocation although unlikely since he ambulated on it and no focal abnormalities on exam.  We will obtain labs to further evaluate for acute electrolyte abnormalities, AKI or anemia contributing to generalized fatigue and weakness. Pt/INR for liver dysfunction as well as ammonia.  Will obtain UA to further evaluate for UTI.  Will obtain CT head to ensure no acute intracranial abnormality although low suspicion for CVA as discussed in MDM, CT CAP to further evaluate for infection, liver pathology, or other bony abnormalities contributing to pain.  We will give patient IV thiamine, Toradol, Lidoderm patch and morphine and reassess. Disposition pending workup.    Amount and/or Complexity of Data Reviewed Labs: ordered. Radiology: ordered and independent interpretation performed. Decision-making details documented in ED Course.    Details: CT head personally reviewed no ICH.  Risk Prescription drug management. Decision regarding hospitalization.    Final Clinical Impression(s) / ED Diagnoses Final diagnoses:  None    Rx / DC Orders ED Discharge Orders     None         Elgie Congo, MD 02/16/22 2357

## 2022-02-16 NOTE — H&P (Signed)
History and Physical   [name] XTA:[id number] DOB: 22-Nov-1955 DOA: 02/16/2022  PCP: [name], MD   Patient coming from: Home  Chief Complaint: Back pain  HPI: Marc Ponce is a 66 y.o. male with medical history significant of steatohepatitis, chronic alcohol use, hypertension, hyperlipidemia, GERD, diverticulosis presenting with increasing pain.  Patient was seen on October 13 with back pain/strain that had onset while sleeping***he was diagnosed with sciatica and given a dose of Toradol and prescribed steroids and Robaxin which helped initially.    Now has had increasing pain in his low back to the point where he is needs significant help from family to get out of bed and do things at home has had episodes of incontinence simply due to not being able to make it to the bathroom in time due to pain.  He also reports some associated generalized fatigue and falling in the dark on his left knee.  He denies any focal weakness, focal numbness, incontinence other than the above described delay incontinence.  He has significant alcohol use and is now drinking 2-2-1/2 bottles of liquor a day***.  He states he has had decreased intake and stopped drinking on***due to his pain.  No withdrawal symptoms and no history of withdrawal***.  He denies fevers, chills, chest pain, shortness of breath, abdominal pain, constipation, diarrhea, nausea, vomiting.***  ED Course: Vital signs in the ED significant for blood pressure in the 655V to 748O systolic.  Respiratory rate in the teens to 20s.  Lab work-up included CMP with sodium 133, bicarb 21, BUN 85, creatinine elevated 2.05, glucose 164, albumin 2.6, AST 82, alk phos 156, T. bili 8.  CBC with leukocytosis to 18.5, hemoglobin stable at 12.4, platelets stable at 148.  PT and INR elevated at 21 and 1.8 respectively.  TSH and T4 normal.  Lactic acid initially elevated to 3.2 but improved to 2.5 with IV fluids.  Ammonia elevated at 86.   Urinalysis with trace hemoglobin only.  Blood cultures pending.  X-ray of the left knee showed no acute abnormality, CT head showed no acute abnormality.  CT of the chest abdomen pelvis showed small bilateral pleural effusions with atelectasis, cirrhosis with portal hypertension and splenomegaly with small ascites.  Also noted was moderate splenic infarct.  Gallstones and diverticulosis without diverticulitis noted as well.  Magnesium level mildly elevated at 2.8.  Patient received cefepime, Flagyl, Toradol, morphine, lactulose, heparin, IV fluids, B1 in the ED.  Review of Systems: As per HPI otherwise all other systems reviewed and are negative.  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 Alvan Dame. [facility]   SHOULDER SURGERY  01/12/2010   Left     Social History  reports that he quit smoking about 35 years ago. His smoking use included cigarettes. He has never used smokeless tobacco. He reports current alcohol use. He reports that he does not use drugs.  No Known Allergies  Family History  Problem Relation Age of Onset   Dementia Mother    Arthritis Mother    Heart disease Father 63       MI    Hypertension Father    Hyperlipidemia Father    COPD Father    Diabetes Father    Hearing loss Father    Heart attack Father    Kidney disease Father     Heart disease Maternal Grandmother    Arthritis Maternal Grandmother    Heart disease Maternal Grandfather    Lung cancer Maternal Grandfather    Heart disease Paternal Grandmother    Stroke Paternal Grandmother    Leukemia Paternal Grandfather    Colon cancer Neg Hx    Colon polyps Neg Hx    Stomach cancer Neg Hx    Esophageal cancer Neg Hx   Reviewed on admission  Prior to Admission medications   Medication Sig Start Date End Date Taking? Authorizing Provider  augmented betamethasone dipropionate (DIPROLENE-AF) 0.05 % cream Apply topically as needed. 11/26/19   Lavonna Monarch, MD  lansoprazole (PREVACID) 30 MG capsule Take 30 mg by mouth daily at 12 noon.    [provider]  losartan-hydrochlorothiazide (HYZAAR) 100-12.5 MG tablet Take 1 tablet by mouth daily. 09/20/21   [name], MD  methocarbamol (ROBAXIN) 500 MG tablet Take 1 tablet (500 mg total) by mouth every 8 (eight) hours as needed for muscle spasms. 02/11/22   Davonna Belling, MD  metoprolol succinate (TOPROL-XL) 50 MG 24 hr tablet TAKE 1 TABLET BY MOUTH DAILY. TAKE WITH OR IMMEDIATELY FOLLOWING A MEAL. 09/20/21   [name], MD  predniSONE (DELTASONE) 20 MG tablet Take 2 tablets (40 mg total) by mouth daily. First dose on October 14. 02/11/22   Davonna Belling, MD  Probiotic Product (PROBIOTIC DAILY PO) Take by mouth.    [provider]    Physical Exam: Vitals:   02/16/22 2004 02/16/22 2030 02/16/22 2100 02/16/22 2149  BP: (!) 122/53 (!) 105/47 (!) 114/53 133/66  Pulse: 73 71 72 73  Resp: 18 (!) 22 18 20   Temp: 97.8 F (36.6 C)   97.6 F (36.4 C)  TempSrc: Oral   Oral  SpO2: 98% 92% 96% 100%    Physical Exam Constitutional:      General: He is not in acute distress.    Appearance: Normal appearance.  HENT:     Head: Normocephalic and atraumatic.     Mouth/Throat:     Mouth: Mucous membranes are moist.     Pharynx: Oropharynx is clear.  Eyes:     Extraocular Movements:  Extraocular movements intact.     Pupils: Pupils are equal, round, and reactive to light.  Cardiovascular:     Rate and Rhythm: Normal rate and regular rhythm.     Pulses: Normal pulses.     Heart sounds: Normal heart sounds.  Pulmonary:     Effort: Pulmonary effort is normal. No respiratory distress.     Breath sounds: Normal breath sounds.  Abdominal:     General: Bowel sounds are normal. There is no distension.     Palpations: Abdomen is soft.     Tenderness: There is no abdominal tenderness.  Musculoskeletal:        General: No swelling or deformity.  Skin:    General: Skin is warm and dry.  Neurological:     General: No focal deficit present.     Mental Status: Mental status is at baseline.   ***  Labs on Admission: I have personally reviewed following labs and imaging studies  CBC: Recent Labs  Lab 02/16/22 1700  WBC 18.5*  NEUTROABS 15.6*  HGB 12.4*  HCT 36.9*  MCV 98.4  PLT 148*    Basic Metabolic Panel: Recent Labs  Lab 02/16/22 1700  NA 133*  K 4.4  CL 101  CO2 21*  GLUCOSE 164*  BUN 85*  CREATININE 2.05*  CALCIUM 8.9  MG 2.8*    GFR: Estimated Creatinine Clearance: 40.2 mL/min (A) (by C-G formula based on SCr of 2.05 mg/dL (H)).  Liver Function Tests: Recent Labs  Lab 02/16/22 1700  AST 82*  ALT 40  ALKPHOS 156*  BILITOT 8.0*  PROT 7.2  ALBUMIN 2.6*    Urine analysis:    Component Value Date/Time   COLORURINE YELLOW 02/16/2022 1820   APPEARANCEUR CLEAR 02/16/2022 1820   LABSPEC 1.016 02/16/2022 1820   PHURINE 5.5 02/16/2022 1820   GLUCOSEU NEGATIVE 02/16/2022 1820   HGBUR TRACE (A) 02/16/2022 [place] 02/16/2022 1820   BILIRUBINUR negative 04/15/2017 1616   KETONESUR NEGATIVE 02/16/2022 1820   PROTEINUR NEGATIVE 02/16/2022 1820   UROBILINOGEN 0.2 04/15/2017 1616   UROBILINOGEN 4.0 (H) 04/16/2011 2307   NITRITE NEGATIVE 02/16/2022 1820   LEUKOCYTESUR NEGATIVE 02/16/2022 1820    Radiological Exams on  Admission: CT CHEST ABDOMEN PELVIS WO CONTRAST  Result Date: 02/16/2022 CLINICAL DATA:  Right chest pain and severe low back pain. Generalized weakness. EXAM: CT CHEST, ABDOMEN AND PELVIS WITHOUT CONTRAST TECHNIQUE: Multidetector CT imaging of the chest, abdomen and pelvis was performed following the standard protocol without IV contrast. RADIATION DOSE REDUCTION: This exam was performed according to the departmental dose-optimization program which includes automated exposure control, adjustment of the mA and/or kV according to patient size and/or use of iterative reconstruction technique. COMPARISON:  Pelvic CT dated 05/25/2011 and CT abdomen pelvis dated 04/16/2011. FINDINGS: Evaluation of this exam is limited in the absence of intravenous contrast as well as due to respiratory motion. CT CHEST FINDINGS Cardiovascular: There is no cardiomegaly or pericardial effusion. There is coronary vascular calcification. Mild atherosclerotic calcification of the thoracic aorta. No aneurysmal dilatation. The central pulmonary arteries are grossly unremarkable. Mediastinum/Nodes: No hilar or mediastinal adenopathy. The esophagus is grossly unremarkable. Mediastinal fluid collection. Lungs/Pleura: Small bilateral pleural effusions with minimal partial compressive atelectasis of the lower lobes. Pneumonia is not excluded. Clinical correlation is recommended. No pneumothorax. The central airways are patent. Musculoskeletal: Mild degenerative changes of the spine. Partially visualized left humeral head fixation screws. No acute osseous pathology. CT ABDOMEN PELVIS FINDINGS No intra-abdominal free air.  Small ascites. Hepatobiliary: Cirrhosis. Indeterminate hepatic hypodense lesions measuring up to 2 cm in the caudate lobe. These can be better characterized with MRI without and with contrast on a nonemergent/outpatient basis. No biliary ductal dilatation. Gallstones. No pericholecystic fluid or evidence of acute cholecystitis by  CT. Pancreas: The pancreas is grossly unremarkable. Stranding of the peripancreatic fat, likely related to ascites. Correlation with pancreatic enzymes recommended if there is clinical concern for acute pancreatitis. Spleen: The spleen is enlarged measuring approximately 7 cm in length. There is a large area of hypodensity in the mid to upper pole of  the spleen most consistent with infarct. Adrenals/Urinary Tract: The adrenal glands are unremarkable. The kidneys, visualized ureters, and urinary bladder appear unremarkable. Stomach/Bowel: There is sigmoid diverticulosis. There is no bowel obstruction. The appendix is normal. Vascular/Lymphatic: Mild aortoiliac atherosclerotic disease. The IVC is unremarkable. No portal venous gas. There is no adenopathy. Reproductive: The prostate and seminal vesicles are grossly unremarkable. No pelvic mass. Other: Diffuse mesenteric edema. Mild engorgement of the mesenteric vessels. There is enlargement of the umbilical vein. Musculoskeletal: Degenerative changes of the spine. No acute osseous pathology. IMPRESSION: 1. Small bilateral pleural effusions with minimal partial compressive atelectasis of the lower lobes. 2. Cirrhosis with portal hypertension, splenomegaly, and small ascites. 3. Moderate sized splenic infarct. 4. Cholelithiasis. 5. Sigmoid diverticulosis. No bowel obstruction. Normal appendix. 6.  Aortic Atherosclerosis (ICD10-I70.0). Electronically Signed   By: Anner Crete M.D.   On: 02/16/2022 19:06   CT Head Wo Contrast  Result Date: 02/16/2022 CLINICAL DATA:  Recent fall EXAM: CT HEAD WITHOUT CONTRAST TECHNIQUE: Contiguous axial images were obtained from the base of the skull through the vertex without intravenous contrast. RADIATION DOSE REDUCTION: This exam was performed according to the departmental dose-optimization program which includes automated exposure control, adjustment of the mA and/or kV according to patient size and/or use of iterative  reconstruction technique. COMPARISON:  None Available. FINDINGS: Brain: No acute intracranial findings are seen. There are no signs of bleeding within the cranium. Cortical sulci are prominent. There is 4 mm calcification in the falx in the image 36 of series 4, possibly dystrophic dural calcification. Less likely possibility would be a tiny meningioma. There is no adjacent edema or mass effect. Vascular: Unremarkable. Skull: No fracture is seen. Sinuses/Orbits: There is mucosal thickening in maxillary sinuses, more so on the left side. Other: None. IMPRESSION: No acute intracranial findings are seen in noncontrast CT brain. Atrophy. There is 4 mm calcification in falx adjacent to corpus callosum, possibly dystrophic dural calcification. Chronic maxillary sinusitis. Electronically Signed   By: Elmer Picker M.D.   On: 02/16/2022 18:33   DG Knee 2 Views Left  Result Date: 02/16/2022 CLINICAL DATA:  Fall, pain. EXAM: LEFT KNEE - 1-2 VIEW COMPARISON:  None Available. FINDINGS: Status post left knee total arthroplasty. The hardware is intact. No perihardware loosening. No acute fracture. IMPRESSION: Status post left knee total arthroplasty. No acute fracture. Electronically Signed   By: Keane Police D.O.   On: 02/16/2022 17:12    EKG: Independently reviewed.  Sinus rhythm at 81 bpm.  Baseline wander noted worse in V1.  Baseline artifact worse in V1 as well.  PAC noted.  Nonspecific intraventricular conduction delay with QRS of 121.  Assessment/Plan Principal Problem:   Cirrhosis ([place]) Active Problems:   Steatohepatitis   Alcohol abuse, daily use   HTN (hypertension)   Hyperlipidemia   Elevated LFTs   GERD (gastroesophageal reflux disease)   Splenic infarct > During the course of patient's work-up patient noted to have moderate splenic infarct and has been started on heparin.  In the setting of portal hypertension, splenomegaly, developing cirrhosis. - Continue heparin for now - Trend  PTT/INR, CBC  AKI > Creatinine elevated to 2.05 in the ED from baseline of 1.1-1.2 however most recent labs were a year ago.  In the setting of decreased p.o. intake with his pain as well as increased NSAID use. > Received a liter of fluids in the ED. - Continue with IV fluids overnight - Hold home losartan-hydrochlorothiazide*** - Trend renal function electrolytes  Cirrhosis > Known history of steatohepatitis in the setting of significant alcohol use as below. > Imaging today shows evidence of developing cirrhosis with portal hypertension, splenomegaly, small ascites, bilateral pleural effusions, moderate splenic infarct as above. > Received some lactulose in the ED > MELD Na score 29 with current creatinine. - Message sent to GI for consultation*** - Counseled on cessation of alcohol***  Leukocytosis Rule out SBP > Patient presenting with leukocytosis in the setting of recent significant steroid use.  No other evidence of infection on urinalysis or chest abdomen and pelvis CT.  > No abdominal pain to be truly suspicious for SBP, did receive cefepime and Flagyl in the ED, will hold off on further antibiotics. - Trend CBC ***  Alcohol use > Patient with known history of significant alcohol use.  Per chart review several years ago alcohol intake was 3 to 6 glasses of wine.  Now he reports 2 bottles of liquor***per day. > Stopped drinking on***, has not had any withdrawal symptoms, no history of withdrawal symptoms*** - Daily thiamine, folate, multivitamin - CIWA*** - Counseled on cessation  Muscle skeletal pain > Patient actually presented for his significant musculoskeletal pain however this became less urgent considering his splenic infarct and developing cirrhosis as above. > No focal numbness or weakness no incontinence other than being slow to get to bathroom due to pain.  Unclear if splenic infarct could be causing a degree of referred pain as well. > Hold off on further  steroids as they are complicating the picture with his leukocytosis.  And his pain has returned anyway. -  Continue with Robaxin - Oxy IR for moderate to severe pain - Dilaudid for severe breakthrough pain  GERD - Continue home PPI  Hypertension - Hold home losartan-hydrochlorothiazide - Continue home metoprolol***  DVT prophylaxis: Heparin Code Status:   Full Family Communication:  ***  Disposition Plan:   Patient is from:  Home  Anticipated DC to:  Home  Anticipated DC date:  1 to 4 days  Anticipated DC barriers: None  Consults called:  Message sent to GI for evaluation in the morning*** Admission status:  Observation, telemetry  Severity of Illness: The appropriate patient status for this patient is OBSERVATION. Observation status is judged to be reasonable and necessary in order to provide the required intensity of service to ensure the patient's safety. The patient's presenting symptoms, physical exam findings, and initial radiographic and laboratory data in the context of their medical condition is felt to place them at decreased risk for further clinical deterioration. Furthermore, it is anticipated that the patient will be medically stable for discharge from the hospital within 2 midnights of admission.    Marcelyn Bruins MD [facility]  How to contact the [facility] Attending or Consulting provider [place] or covering provider during after hours [place], for this patient?   Check the care team in [facility] and look for a) attending/consulting [facility] provider listed and b) the [facility] team listed Log into [email or web address] and use [place]'s universal password to access. If you do not have the password, please contact the hospital operator. Locate the [facility] provider you are looking for under [facility] and page to a number that you can be directly reached. If you still have difficulty reaching the provider, please page the [facility] (Director on Call) for the Hospitalists listed on  amion for assistance.  02/16/2022, 11:31 PM

## 2022-02-16 NOTE — ED Notes (Signed)
153m residual in bladder

## 2022-02-16 NOTE — ED Notes (Signed)
Patient transported to CT 

## 2022-02-16 NOTE — ED Notes (Signed)
Pt lethargic, slept while starting IV, wife says he drinks at least 3 bottles of wine per day.  Does c/o shoulder pain mostly.  Fell on Sunday per wife.  Xray at bedside

## 2022-02-16 NOTE — Progress Notes (Signed)
ANTICOAGULATION CONSULT NOTE - Initial Consult  Pharmacy Consult for heparin  Indication:  splenic infarct   No Known Allergies  Patient Measurements:   Heparin Dosing Weight: 87.5kg   Vital Signs: Temp: 97.7 F (36.5 C) (10/18 1530) Temp Source: Oral (10/18 1530) BP: 116/60 (10/18 1900) Pulse Rate: 69 (10/18 1900)  Labs: Recent Labs    02/16/22 1700 02/16/22 1739  HGB 12.4*  --   HCT 36.9*  --   PLT 148*  --   LABPROT  --  21.0*  INR  --  1.8*  CREATININE 2.05*  --     Estimated Creatinine Clearance: 40.2 mL/min (A) (by C-G formula based on SCr of 2.05 mg/dL (H)).   Medical History: Past Medical History:  Diagnosis Date   Allergy    Arthritis    Diverticulitis    GERD (gastroesophageal reflux disease)    History of chicken pox    Hypertension    Peri-rectal abscess    Rectal pain     Assessment: Patient admitted for increased pain in legs and weakness. CT abdomen showing moderate sized splenic infarct.  Patient with AKI, Scr: 2.05 baseline around 1.1-1.2.  Hemoglobin stable at 12.4.   Patient not on anticoagulation prior to admission.   Goal of Therapy:  Heparin level 0.3-0.7 units/ml Monitor platelets by anticoagulation protocol: Yes   Plan:  Give 3000 units bolus x 1 Start heparin infusion at 1600 units/hr Check anti-Xa level in 6 hours and daily while on heparin Continue to monitor H&H and platelets  Ventura Sellers 02/16/2022,7:24 PM

## 2022-02-17 DIAGNOSIS — I8510 Secondary esophageal varices without bleeding: Secondary | ICD-10-CM | POA: Diagnosis present

## 2022-02-17 DIAGNOSIS — Z87891 Personal history of nicotine dependence: Secondary | ICD-10-CM | POA: Diagnosis not present

## 2022-02-17 DIAGNOSIS — K7460 Unspecified cirrhosis of liver: Secondary | ICD-10-CM | POA: Diagnosis present

## 2022-02-17 DIAGNOSIS — D733 Abscess of spleen: Secondary | ICD-10-CM | POA: Diagnosis present

## 2022-02-17 DIAGNOSIS — E782 Mixed hyperlipidemia: Secondary | ICD-10-CM | POA: Diagnosis not present

## 2022-02-17 DIAGNOSIS — N179 Acute kidney failure, unspecified: Secondary | ICD-10-CM

## 2022-02-17 DIAGNOSIS — R188 Other ascites: Secondary | ICD-10-CM | POA: Diagnosis not present

## 2022-02-17 DIAGNOSIS — Z7189 Other specified counseling: Secondary | ICD-10-CM | POA: Diagnosis not present

## 2022-02-17 DIAGNOSIS — I8511 Secondary esophageal varices with bleeding: Secondary | ICD-10-CM | POA: Diagnosis not present

## 2022-02-17 DIAGNOSIS — D696 Thrombocytopenia, unspecified: Secondary | ICD-10-CM

## 2022-02-17 DIAGNOSIS — F10139 Alcohol abuse with withdrawal, unspecified: Secondary | ICD-10-CM | POA: Diagnosis not present

## 2022-02-17 DIAGNOSIS — Z79899 Other long term (current) drug therapy: Secondary | ICD-10-CM | POA: Diagnosis not present

## 2022-02-17 DIAGNOSIS — Z515 Encounter for palliative care: Secondary | ICD-10-CM | POA: Diagnosis not present

## 2022-02-17 DIAGNOSIS — K5521 Angiodysplasia of colon with hemorrhage: Secondary | ICD-10-CM | POA: Diagnosis not present

## 2022-02-17 DIAGNOSIS — I10 Essential (primary) hypertension: Secondary | ICD-10-CM | POA: Diagnosis present

## 2022-02-17 DIAGNOSIS — A411 Sepsis due to other specified staphylococcus: Secondary | ICD-10-CM | POA: Diagnosis not present

## 2022-02-17 DIAGNOSIS — D62 Acute posthemorrhagic anemia: Secondary | ICD-10-CM | POA: Diagnosis not present

## 2022-02-17 DIAGNOSIS — R6520 Severe sepsis without septic shock: Secondary | ICD-10-CM | POA: Diagnosis not present

## 2022-02-17 DIAGNOSIS — E871 Hypo-osmolality and hyponatremia: Secondary | ICD-10-CM | POA: Diagnosis present

## 2022-02-17 DIAGNOSIS — D735 Infarction of spleen: Secondary | ICD-10-CM | POA: Diagnosis present

## 2022-02-17 DIAGNOSIS — R7989 Other specified abnormal findings of blood chemistry: Secondary | ICD-10-CM

## 2022-02-17 DIAGNOSIS — M4626 Osteomyelitis of vertebra, lumbar region: Secondary | ICD-10-CM | POA: Diagnosis present

## 2022-02-17 DIAGNOSIS — Z66 Do not resuscitate: Secondary | ICD-10-CM | POA: Diagnosis present

## 2022-02-17 DIAGNOSIS — K7031 Alcoholic cirrhosis of liver with ascites: Secondary | ICD-10-CM | POA: Diagnosis not present

## 2022-02-17 DIAGNOSIS — F1010 Alcohol abuse, uncomplicated: Secondary | ICD-10-CM | POA: Diagnosis not present

## 2022-02-17 DIAGNOSIS — K3189 Other diseases of stomach and duodenum: Secondary | ICD-10-CM | POA: Diagnosis not present

## 2022-02-17 DIAGNOSIS — R7881 Bacteremia: Secondary | ICD-10-CM | POA: Diagnosis not present

## 2022-02-17 DIAGNOSIS — M4656 Other infective spondylopathies, lumbar region: Secondary | ICD-10-CM | POA: Diagnosis present

## 2022-02-17 DIAGNOSIS — B957 Other staphylococcus as the cause of diseases classified elsewhere: Secondary | ICD-10-CM | POA: Diagnosis not present

## 2022-02-17 DIAGNOSIS — K922 Gastrointestinal hemorrhage, unspecified: Secondary | ICD-10-CM | POA: Diagnosis not present

## 2022-02-17 DIAGNOSIS — D6959 Other secondary thrombocytopenia: Secondary | ICD-10-CM | POA: Diagnosis present

## 2022-02-17 DIAGNOSIS — D72829 Elevated white blood cell count, unspecified: Secondary | ICD-10-CM | POA: Diagnosis not present

## 2022-02-17 DIAGNOSIS — K219 Gastro-esophageal reflux disease without esophagitis: Secondary | ICD-10-CM | POA: Diagnosis not present

## 2022-02-17 DIAGNOSIS — K651 Peritoneal abscess: Secondary | ICD-10-CM | POA: Diagnosis present

## 2022-02-17 DIAGNOSIS — K2211 Ulcer of esophagus with bleeding: Secondary | ICD-10-CM | POA: Diagnosis not present

## 2022-02-17 DIAGNOSIS — K7682 Hepatic encephalopathy: Secondary | ICD-10-CM | POA: Diagnosis not present

## 2022-02-17 DIAGNOSIS — K254 Chronic or unspecified gastric ulcer with hemorrhage: Secondary | ICD-10-CM | POA: Diagnosis not present

## 2022-02-17 DIAGNOSIS — K7030 Alcoholic cirrhosis of liver without ascites: Secondary | ICD-10-CM | POA: Diagnosis present

## 2022-02-17 DIAGNOSIS — M4620 Osteomyelitis of vertebra, site unspecified: Secondary | ICD-10-CM | POA: Diagnosis not present

## 2022-02-17 DIAGNOSIS — K2210 Ulcer of esophagus without bleeding: Secondary | ICD-10-CM | POA: Diagnosis not present

## 2022-02-17 DIAGNOSIS — M6281 Muscle weakness (generalized): Secondary | ICD-10-CM | POA: Diagnosis not present

## 2022-02-17 DIAGNOSIS — R531 Weakness: Secondary | ICD-10-CM | POA: Diagnosis not present

## 2022-02-17 DIAGNOSIS — E785 Hyperlipidemia, unspecified: Secondary | ICD-10-CM | POA: Diagnosis present

## 2022-02-17 DIAGNOSIS — K766 Portal hypertension: Secondary | ICD-10-CM | POA: Diagnosis present

## 2022-02-17 DIAGNOSIS — K921 Melena: Secondary | ICD-10-CM | POA: Diagnosis not present

## 2022-02-17 LAB — BLOOD CULTURE ID PANEL (REFLEXED) - BCID2

## 2022-02-17 LAB — HEPATITIS PANEL, ACUTE
HCV Ab: NONREACTIVE
Hep A IgM: NONREACTIVE
Hep B C IgM: NONREACTIVE
Hepatitis B Surface Ag: NONREACTIVE

## 2022-02-17 LAB — COMPREHENSIVE METABOLIC PANEL
ALT: 43 U/L (ref 0–44)
AST: 77 U/L — ABNORMAL HIGH (ref 15–41)
Albumin: 1.8 g/dL — ABNORMAL LOW (ref 3.5–5.0)
Alkaline Phosphatase: 134 U/L — ABNORMAL HIGH (ref 38–126)
Anion gap: 9 (ref 5–15)
BUN: 85 mg/dL — ABNORMAL HIGH (ref 8–23)
CO2: 20 mmol/L — ABNORMAL LOW (ref 22–32)
Calcium: 7.7 mg/dL — ABNORMAL LOW (ref 8.9–10.3)
Chloride: 104 mmol/L (ref 98–111)
Creatinine, Ser: 2.22 mg/dL — ABNORMAL HIGH (ref 0.61–1.24)
GFR, Estimated: 32 mL/min — ABNORMAL LOW (ref >60)
Glucose, Bld: 101 mg/dL — ABNORMAL HIGH (ref 70–99)
Potassium: 4.2 mmol/L (ref 3.5–5.1)
Sodium: 133 mmol/L — ABNORMAL LOW (ref 135–145)
Total Bilirubin: 7 mg/dL — ABNORMAL HIGH (ref 0.3–1.2)
Total Protein: 6.5 g/dL (ref 6.5–8.1)

## 2022-02-17 LAB — LACTIC ACID, PLASMA: Lactic Acid, Venous: 1.9 mmol/L (ref 0.5–1.9)

## 2022-02-17 LAB — CBC
HCT: 35 % — ABNORMAL LOW (ref 39.0–52.0)
Hemoglobin: 11.4 g/dL — ABNORMAL LOW (ref 13.0–17.0)
MCH: 32.9 pg (ref 26.0–34.0)
MCHC: 32.6 g/dL (ref 30.0–36.0)
MCV: 100.9 fL — ABNORMAL HIGH (ref 80.0–100.0)
Platelets: 135 10*3/uL — ABNORMAL LOW (ref 150–400)
RBC: 3.47 MIL/uL — ABNORMAL LOW (ref 4.22–5.81)
RDW: 18.5 % — ABNORMAL HIGH (ref 11.5–15.5)
WBC: 16.3 10*3/uL — ABNORMAL HIGH (ref 4.0–10.5)
nRBC: 0 % (ref 0.0–0.2)

## 2022-02-17 LAB — PROTIME-INR
INR: 2.1 — ABNORMAL HIGH (ref 0.8–1.2)
Prothrombin Time: 23.3 seconds — ABNORMAL HIGH (ref 11.4–15.2)

## 2022-02-17 LAB — HEPARIN LEVEL (UNFRACTIONATED)
Heparin Unfractionated: <0.1 IU/mL — ABNORMAL LOW (ref 0.30–0.70)
Heparin Unfractionated: 0.16 IU/mL — ABNORMAL LOW (ref 0.30–0.70)
Heparin Unfractionated: 0.26 IU/mL — ABNORMAL LOW (ref 0.30–0.70)

## 2022-02-17 LAB — MAGNESIUM: Magnesium: 2.7 mg/dL — ABNORMAL HIGH (ref 1.7–2.4)

## 2022-02-17 MED ORDER — LORAZEPAM 1 MG PO TABS: 1.0000 mg | ORAL_TABLET | ORAL | Status: AC | PRN

## 2022-02-17 MED ORDER — CEFAZOLIN SODIUM-DEXTROSE 2-4 GM/100ML-% IV SOLN
2.0000 g | Freq: Three times a day (TID) | INTRAVENOUS | Status: DC
  Administered 2022-02-17 – 2022-02-28 (×33): 2 g via INTRAVENOUS
  Filled 2022-02-17 (×35): qty 100

## 2022-02-17 MED ORDER — LORAZEPAM 2 MG/ML IJ SOLN
1.0000 mg | INTRAMUSCULAR | Status: AC | PRN
  Administered 2022-02-18: 1 mg via INTRAVENOUS
  Administered 2022-02-18: 3 mg via INTRAVENOUS
  Filled 2022-02-17: qty 2
  Filled 2022-02-17: qty 1

## 2022-02-17 MED ORDER — HEPARIN BOLUS VIA INFUSION
1500.0000 [IU] | Freq: Once | INTRAVENOUS | Status: AC
  Administered 2022-02-17: 1500 [IU] via INTRAVENOUS
  Filled 2022-02-17: qty 1500

## 2022-02-17 MED ORDER — ADULT MULTIVITAMIN W/MINERALS CH
1.0000 | ORAL_TABLET | Freq: Every day | ORAL | Status: DC
  Administered 2022-02-17 – 2022-02-28 (×12): 1 via ORAL
  Filled 2022-02-17 (×12): qty 1

## 2022-02-17 MED ORDER — HEPARIN (PORCINE) 25000 UT/250ML-% IV SOLN
2150.0000 [IU]/h | INTRAVENOUS | Status: DC
  Administered 2022-02-17 (×2): 1950 [IU]/h via INTRAVENOUS
  Filled 2022-02-17: qty 250

## 2022-02-17 MED ORDER — ALBUMIN HUMAN 25 % IV SOLN
100.0000 g | INTRAVENOUS | Status: AC
  Administered 2022-02-17 – 2022-02-18 (×2): 100 g via INTRAVENOUS
  Filled 2022-02-17 (×2): qty 400

## 2022-02-17 MED ORDER — HEPARIN (PORCINE) 25000 UT/250ML-% IV SOLN: 1950.0000 [IU]/h | INTRAVENOUS | Status: DC

## 2022-02-17 MED ORDER — HEPARIN BOLUS VIA INFUSION
3000.0000 [IU] | Freq: Once | INTRAVENOUS | Status: AC
  Administered 2022-02-17: 3000 [IU] via INTRAVENOUS
  Filled 2022-02-17: qty 3000

## 2022-02-17 MED ORDER — LACTULOSE 10 GM/15ML PO SOLN
20.0000 g | Freq: Two times a day (BID) | ORAL | Status: DC
  Administered 2022-02-17 – 2022-02-20 (×6): 20 g via ORAL
  Filled 2022-02-17 (×7): qty 30

## 2022-02-17 MED ORDER — HEPARIN BOLUS VIA INFUSION
3000.0000 [IU] | INTRAVENOUS | Status: DC
  Filled 2022-02-17: qty 3000

## 2022-02-17 MED ORDER — FOLIC ACID 1 MG PO TABS
1.0000 mg | ORAL_TABLET | Freq: Every day | ORAL | Status: DC
  Administered 2022-02-17 – 2022-02-28 (×12): 1 mg via ORAL
  Filled 2022-02-17 (×12): qty 1

## 2022-02-17 MED ORDER — HEPARIN (PORCINE) 25000 UT/250ML-% IV SOLN: 1700.0000 [IU]/h | INTRAVENOUS | Status: DC

## 2022-02-17 MED ORDER — THIAMINE MONONITRATE 100 MG PO TABS
100.0000 mg | ORAL_TABLET | Freq: Every day | ORAL | Status: DC
  Administered 2022-02-17 – 2022-02-28 (×11): 100 mg via ORAL
  Filled 2022-02-17 (×11): qty 1

## 2022-02-17 MED ORDER — THIAMINE HCL 100 MG/ML IJ SOLN
100.0000 mg | Freq: Every day | INTRAMUSCULAR | Status: DC
  Administered 2022-02-19: 100 mg via INTRAVENOUS
  Filled 2022-02-17: qty 2

## 2022-02-17 NOTE — Consult Note (Addendum)
Attending physician's note   I have taken a history, reviewed the chart, and examined the patient. I performed a substantive portion of this encounter, including complete performance of at least one of the key components, in conjunction with the APP. I agree with the APP's note, impression, and recommendations with my edits.   66 year old male with medical history as outlined below, initially presenting to the ER with back pain, and GI service consulted for essentially new diagnosis of cirrhosis.  As a history of heavy EtOH use disorder.  He reports being told by his PCM in the past that if he continue drinking he would likely develop cirrhosis.  Otherwise, has never been formally diagnosed with this.  Currently drinks 2+ bottles of wine/day and has been drinking for at least 40 years.  - 04/22/2010: EGD at [facility] with possible short segment Barrett's Esophagus - 02/28/2020: Colonoscopy: 5 mm sessile cecal polyp (SSP) resected and required hemostatic clip placement, for subcentimeter adenomas.  Recommended repeat in 3 years - 03/2020-06/2020: Completed hemorrhoid banding series with Dr. Fuller Plan  Admission evaluation notable for the following: - NA 133, BUN/creatinine 85/2.2 - WBC 18.5 --> 16.3 - H/H 11.4/35, MCV/RDW 101/18.5 - PLT 135 - Protein/albumin 7.2/2.6, AST/ALT 82/40, T. bili 8, ALP 156 - INR 2.1 - Lactate 3.2 --> 1.9 - Ammonia 86 - UA normal - Blood cultures pending - CT C/A/P: Cirrhotic appearing liver.  2 cm indeterminate hypodense lesion in the caudate lobe.  Cholelithiasis without cholecystitis, ascites, splenomegaly with moderate infarct.  Small bilateral pleural effusions, diffuse mesenteric edema  Comparison labs from 03/2020: - NA 136, BUN/creatinine 19/1.2 - Protein/albumin 7.1/3.3, AST/ALT 118/55, T. bili 6.3, ALP 160 - PLT 152, H/H 12.3/33.8   1) Decompensated Cirrhosis 2) Ascites 3) Splenomegaly 4) Coagulopathy 5) Thrombocytopenia 6) Hyperbilirubinemia 7)  EtOH use disorder  66 year old male with essentially newly diagnosed decompensated cirrhosis, presumed 2/2 EtOH use disorder.  I discussed this diagnosis with the patient at length today with plan for the following: - Check viral hepatitis panel with vaccination as appropriate - MELD 29 - Start rifaximin and lactulose - Will eventually need EGD for EV screening - Will eventually need MRI three-phase liver without and with contrast for [facility] screening and evaluation of indeterminate 2 cm hypodense lesion in the caudate lobe.  Can be done as outpatient - Trend liver enzymes - CIWA protocol - Counseled patient on complete cessation of EtOH - Would lean more on IV medications for the time being given degree of mesenteric edema and potential reduced absorptive capacity  8) AKI - Start albumin 25% 1 g/kg/day x2 days for possible HRS - ARB held on admission - Hold off on initiating Lasix/spironolactone for ascites until renal function improves - Depending on response to albumin trial, if no improvement, plan to start midodrine/octreotide for possible HRS - Trend daily BMP  9) Splenic infarct - Was started on heparin gtt. on admission - Management per primary Hospitalist service.  Reasonable to obtain Hematology input on transitioning anticoagulation therapy and duration of therapy needed  10) Leukocytosis - Blood cultures just returned with staph lugdunensis.  Was started on Ancef by consulting Clinical Pharmacy service  11) History of colon polyps - Due for repeat colonoscopy in 2024   Marc Moorehouse, DO, [place] [phone] office           Consultation  Referring Provider:   Dr. Louanne Belton   Primary Care Physician:  [name], MD Primary Gastroenterologist: Dr. Ardis Hughs  Reason for Consultation:  Cirrhosis and Abdominal Pain            HPI:   Marc Ponce is a 66 y.o. male with a past medical history significant for steatohepatitis, chronic alcohol use,  hypertension, hyperlipidemia, GERD and diverticulosis, who presented to the ED on 02/16/2022 with increasing back pain.    At time of admission patient described being seen 02/11/2022 with back pain/strain that had onset while sleeping in an unusual position where he fell asleep while sitting and slept upright on his side, diagnosed with sciatica and given a dose of Toradol and prescribed steroids and Robaxin which helped initially but then had increasing pain in his low back to the point where he needed significant help from family get out of bed and do things at home as well as some episodes of incontinence simply do not feel to make to the bathroom.  Along with this described associated generalized fatigue and falling in the dark on his left knee.  Discussed significant alcohol use and was drinking 2-2-1/2 bottles of wine per day.  He has had decreased intake and stop drinking October 11 due to his pain.  No withdrawal symptoms.  Describes a recent constipation rate improved and some mild localized left upper quadrant abdominal pain.  We are consulted in regards to a new finding of cirrhosis.    Today, the patient tells me that on 02/09/2022 everything just started to go downhill, apparently that was the first time that he started having back/shoulder pain and presented to the ED and was diagnosed with sciatica and given meds for that which helped for couple of days but then symptoms started to progress again to the point where he could not even get out of the bed and was urinating and defecating on himself because he could not get there in time.  That is when he came back to the hospital just recently and was admitted.  Tells me he was aware of fatty liver but had never been diagnosed with cirrhosis before.  Does admit that he has been drinking alcohol for at least 40 years, but at first this was just socially and then has increased in severity with time, he has tried to stop by himself on a few different  occasions which does not last long, most recently had switched to a "lower alcohol level white wine", and he was drinking 2 or more bottles a day "thinking I was doing myself a favor".  He had not had any alcohol since 02/09/2022.  Does not seem to have had any withdrawal symptoms.  Continues with some vague abdominal pain mostly in the left side of his abdomen just to the left and slightly superior to his umbilicus which comes and goes and "does not hinder my day", but it is worse if he lays on his right side.    Denies fever, chills, weight loss, nausea, vomiting or symptoms that awaken him from sleep.  ED course: CMP with a sodium 133, bicarb 21, BUN 85, creatinine 2.05, glucose 164, albumin 2.6, AST 82, alk phos 156, T. bili 8, CBC with leukocytosis to 18.5, hemoglobin stable at 12.4, platelets stable at 148, PT/INR elevated at 21/1.8, TSH and T4 normal, lactic acid initially elevated but improved with IV fluids, ammonia elevated 86, urinalysis with trace hemoglobin, blood cultures pending, x-ray left knee with no abnormality, CT head no normality, CT of the chest abdomen pelvis showed small bilateral pleural effusions with atelectasis, cirrhosis with portal hypertension  and splenomegaly with small ascites, also noted moderate splenic infarct, gallstones, diverticulosis  GI history: 02/28/2020 screening colonoscopy: -Four 2 to 5 mm polyps in the sigmoid colon, in the ascending colon and in the cecum, removed with a cold snare. Resected and retrieved. - The 73m sessile polyp in the cecum was a bit atypical appearing (vasculasr but with discrete, raised borders) and it was nearby more obvious AVMs in cecum. After cold snare resection the site bled more than usual and required placement of a single endoclip. - Internal hemorrhoids. - The examination was otherwise normal on direct and retroflexion views.  Pathology: Sessile serrated polyp and tubular adenoma repeat recommended in 3 years  04/22/2010  EGD: [facility]-possible Barrett's esophagus at that time  Past Medical History:  Diagnosis Date   Allergy    Arthritis    Diverticulitis    GERD (gastroesophageal reflux disease)    History of chicken pox    Hypertension    Peri-rectal abscess    Rectal pain     Past Surgical History:  Procedure Laterality Date   arthoscopic surgery on Left Knee  04/10/2014   fatty tumor removed Left    left side of face   FRACTURE SURGERY     left shoulder x 6 yrs ago and left x 42 yrs ago femur -knee cap   HEMORRHOID BANDING     OTHER SURGICAL HISTORY     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   [place]/12/[place]   Left     Family History  Problem Relation Age of Onset   Dementia Mother    Arthritis Mother    Heart disease Father 662      MI    Hypertension Father    Hyperlipidemia Father    COPD Father    Diabetes Father    Hearing loss Father    Heart attack Father    Kidney disease Father    Heart disease Maternal Grandmother    Arthritis Maternal Grandmother    Heart disease Maternal Grandfather    Lung cancer Maternal Grandfather    Heart disease Paternal Grandmother    Stroke Paternal Grandmother    Leukemia Paternal Grandfather    Colon cancer Neg Hx    Colon polyps Neg Hx    Stomach cancer Neg Hx    Esophageal cancer Neg Hx     Social History   Tobacco Use   Smoking status: Former    Types: Cigarettes    Quit date: 04/01/1986    Years since quitting: 35.9   Smokeless tobacco: Never  Vaping Use   Vaping Use: Never used  Substance Use Topics   Alcohol use: Yes    Comment: 3-6 per day   Drug use: No    Prior to Admission medications   Medication Sig Start Date End Date Taking? Authorizing Provider  ipratropium (ATROVENT) 0.06 % nasal spray Place 2 sprays into both  nostrils 2 (two) times daily as needed for rhinitis. 09/10/21  Yes [provider]  lansoprazole (PREVACID) 30 MG capsule Take 30 mg by mouth daily as needed (Reflux).   Yes [provider]  losartan-hydrochlorothiazide (HYZAAR) 100-12.5 MG tablet Take 1 tablet by mouth daily. 09/20/21  Yes HMarin Olp MD  metoprolol succinate (TOPROL-XL) 50 MG  24 hr tablet TAKE 1 TABLET BY MOUTH DAILY. TAKE WITH OR IMMEDIATELY FOLLOWING A MEAL. Patient taking differently: Take 50 mg by mouth daily. TAKE WITH OR IMMEDIATELY FOLLOWING A MEAL. 09/20/21  Yes [name], MD  Probiotic Product (PROBIOTIC DAILY PO) Take 1 capsule by mouth daily.   Yes [provider]  methocarbamol (ROBAXIN) 500 MG tablet Take 1 tablet (500 mg total) by mouth every 8 (eight) hours as needed for muscle spasms. Patient not taking: Reported on 02/17/2022 02/11/22   Davonna Belling, MD  predniSONE (DELTASONE) 20 MG tablet Take 2 tablets (40 mg total) by mouth daily. First dose on October 14. Patient not taking: Reported on 02/17/2022 02/11/22   Davonna Belling, MD    Current Facility-Administered Medications  Medication Dose Route Frequency Provider Last Rate Last Admin   0.9 %  sodium chloride infusion   Intravenous Continuous Marcelyn Bruins, MD 100 mL/hr at 02/17/22 0150 New Bag at 02/17/22 0150   acetaminophen (TYLENOL) tablet 650 mg  650 mg Oral Q6H PRN Marcelyn Bruins, MD       Or   acetaminophen (TYLENOL) suppository 650 mg  650 mg Rectal Q6H PRN Marcelyn Bruins, MD       folic acid (FOLVITE) tablet 1 mg  1 mg Oral Daily Pokhrel, Laxman, MD   1 mg at 02/17/22 0846   heparin ADULT infusion 100 units/mL (25000 units/281m)  1,700 Units/hr Intravenous Continuous JAngela Adam RPH 17 mL/hr at 02/17/22 0902 1,700 Units/hr at 02/17/22 0902   HYDROmorphone (DILAUDID) injection 0.5 mg  0.5 mg Intravenous Q3H PRN MMarcelyn Bruins MD       lidocaine (LIDODERM) 5 % 1 patch  1  patch Transdermal Q24H MMarcelyn Bruins MD   1 patch at 02/16/22 1711   LORazepam (ATIVAN) tablet 1-4 mg  1-4 mg Oral Q1H PRN Pokhrel, Laxman, MD       Or   LORazepam (ATIVAN) injection 1-4 mg  1-4 mg Intravenous Q1H PRN Pokhrel, Laxman, MD       methocarbamol (ROBAXIN) tablet 500 mg  500 mg Oral Q8H PRN MMarcelyn Bruins MD       metoprolol succinate (TOPROL-XL) 24 hr tablet 50 mg  50 mg Oral Daily MMarcelyn Bruins MD   50 mg at 02/17/22 0847   multivitamin with minerals tablet 1 tablet  1 tablet Oral Daily Pokhrel, Laxman, MD   1 tablet at 02/17/22 0848   oxyCODONE (Oxy IR/ROXICODONE) immediate release tablet 5 mg  5 mg Oral Q4H PRN MMarcelyn Bruins MD       pantoprazole (PROTONIX) EC tablet 40 mg  40 mg Oral Daily MMarcelyn Bruins MD   40 mg at 02/17/22 0847   polyethylene glycol (MIRALAX / GLYCOLAX) packet 17 g  17 g Oral Daily PRN MMarcelyn Bruins MD       sodium chloride flush (NS) 0.9 % injection 3 mL  3 mL Intravenous Q12H MMarcelyn Bruins MD   3 mL at 02/17/22 0848   thiamine (VITAMIN B1) tablet 100 mg  100 mg Oral Daily Pokhrel, Laxman, MD   100 mg at 02/17/22 00254  Or   thiamine (VITAMIN B1) injection 100 mg  100 mg Intravenous Daily Pokhrel, Laxman, MD        Allergies as of 02/16/2022   (No Known Allergies)     Review of Systems:    Constitutional: No weight loss, fever or chills Skin: No rash Cardiovascular: No  chest pain Respiratory: No SOB Gastrointestinal: See HPI and otherwise negative Genitourinary: No dysuria Neurological: No headache, dizziness or syncope Musculoskeletal: See HPI Hematologic: No bleeding  Psychiatric: No history of depression or anxiety    Physical Exam:  Vital signs in last 24 hours: Temp:  [97.4 F (36.3 C)-97.9 F (36.6 C)] 97.9 F (36.6 C) (10/19 0825) Pulse Rate:  [60-83] 77 (10/19 0825) Resp:  [16-22] 16 (10/19 0825) BP: (105-135)/(39-66) 125/64 (10/19 0825) SpO2:  [90 %-100 %] 95 % (10/19 0825) Weight:   [97.5 kg] 97.5 kg (10/19 0500) Last BM Date : 02/17/22 General:   Pleasant obese Caucasian male appears to be in NAD, Well developed, Well nourished, alert and cooperative Head:  Normocephalic and atraumatic. Eyes:   PEERL, EOMI. No icterus. Conjunctiva pink. Ears:  Normal auditory acuity. Neck:  Supple Throat: Oral cavity and pharynx without inflammation, swelling or lesion. Lungs: Respirations even and unlabored. Lungs clear to auscultation bilaterally.   No wheezes, crackles, or rhonchi.  Heart: Normal S1, S2. No MRG. Regular rate and rhythm. No peripheral edema, cyanosis or pallor.  Abdomen:  Soft, nondistended, mild left sided ttp, No rebound or guarding. Normal bowel sounds. No appreciable masses or hepatomegaly. Rectal:  Not performed.  Msk:  Symmetrical without gross deformities. Peripheral pulses intact.  Extremities:  Without edema, no deformity or joint abnormality.  Neurologic:  Alert and  oriented x4;  grossly normal neurologically.  Skin:   Dry and intact without significant lesions or rashes. Psychiatric: Demonstrates good judgement and reason without abnormal affect or behaviors.  LAB RESULTS: Recent Labs    02/16/22 1700 02/17/22 0141  WBC 18.5* 16.3*  HGB 12.4* 11.4*  HCT 36.9* 35.0*  PLT 148* 135*   BMET Recent Labs    02/16/22 1700 02/17/22 0141  NA 133* 133*  K 4.4 4.2  CL 101 104  CO2 21* 20*  GLUCOSE 164* 101*  BUN 85* 85*  CREATININE 2.05* 2.22*  CALCIUM 8.9 7.7*   LFT Recent Labs    02/17/22 0141  PROT 6.5  ALBUMIN 1.8*  AST 77*  ALT 43  ALKPHOS 134*  BILITOT 7.0*   PT/INR Recent Labs    02/16/22 1739 02/17/22 0141  LABPROT 21.0* 23.3*  INR 1.8* 2.1*    STUDIES: CT CHEST ABDOMEN PELVIS WO CONTRAST  Result Date: 02/16/2022 CLINICAL DATA:  Right chest pain and severe low back pain. Generalized weakness. EXAM: CT CHEST, ABDOMEN AND PELVIS WITHOUT CONTRAST TECHNIQUE: Multidetector CT imaging of the chest, abdomen and pelvis was  performed following the standard protocol without IV contrast. RADIATION DOSE REDUCTION: This exam was performed according to the departmental dose-optimization program which includes automated exposure control, adjustment of the mA and/or kV according to patient size and/or use of iterative reconstruction technique. COMPARISON:  Pelvic CT dated 05/25/2011 and CT abdomen pelvis dated 04/16/2011. FINDINGS: Evaluation of this exam is limited in the absence of intravenous contrast as well as due to respiratory motion. CT CHEST FINDINGS Cardiovascular: There is no cardiomegaly or pericardial effusion. There is coronary vascular calcification. Mild atherosclerotic calcification of the thoracic aorta. No aneurysmal dilatation. The central pulmonary arteries are grossly unremarkable. Mediastinum/Nodes: No hilar or mediastinal adenopathy. The esophagus is grossly unremarkable. Mediastinal fluid collection. Lungs/Pleura: Small bilateral pleural effusions with minimal partial compressive atelectasis of the lower lobes. Pneumonia is not excluded. Clinical correlation is recommended. No pneumothorax. The central airways are patent. Musculoskeletal: Mild degenerative changes of the spine. Partially visualized left humeral head fixation screws. No acute  osseous pathology. CT ABDOMEN PELVIS FINDINGS No intra-abdominal free air.  Small ascites. Hepatobiliary: Cirrhosis. Indeterminate hepatic hypodense lesions measuring up to 2 cm in the caudate lobe. These can be better characterized with MRI without and with contrast on a nonemergent/outpatient basis. No biliary ductal dilatation. Gallstones. No pericholecystic fluid or evidence of acute cholecystitis by CT. Pancreas: The pancreas is grossly unremarkable. Stranding of the peripancreatic fat, likely related to ascites. Correlation with pancreatic enzymes recommended if there is clinical concern for acute pancreatitis. Spleen: The spleen is enlarged measuring approximately 7 cm in  length. There is a large area of hypodensity in the mid to upper pole of the spleen most consistent with infarct. Adrenals/Urinary Tract: The adrenal glands are unremarkable. The kidneys, visualized ureters, and urinary bladder appear unremarkable. Stomach/Bowel: There is sigmoid diverticulosis. There is no bowel obstruction. The appendix is normal. Vascular/Lymphatic: Mild aortoiliac atherosclerotic disease. The IVC is unremarkable. No portal venous gas. There is no adenopathy. Reproductive: The prostate and seminal vesicles are grossly unremarkable. No pelvic mass. Other: Diffuse mesenteric edema. Mild engorgement of the mesenteric vessels. There is enlargement of the umbilical vein. Musculoskeletal: Degenerative changes of the spine. No acute osseous pathology. IMPRESSION: 1. Small bilateral pleural effusions with minimal partial compressive atelectasis of the lower lobes. 2. Cirrhosis with portal hypertension, splenomegaly, and small ascites. 3. Moderate sized splenic infarct. 4. Cholelithiasis. 5. Sigmoid diverticulosis. No bowel obstruction. Normal appendix. 6.  Aortic Atherosclerosis (ICD10-I70.0). Electronically Signed   By: Anner Crete M.D.   On: 02/16/2022 19:06   CT Head Wo Contrast  Result Date: 02/16/2022 CLINICAL DATA:  Recent fall EXAM: CT HEAD WITHOUT CONTRAST TECHNIQUE: Contiguous axial images were obtained from the base of the skull through the vertex without intravenous contrast. RADIATION DOSE REDUCTION: This exam was performed according to the departmental dose-optimization program which includes automated exposure control, adjustment of the mA and/or kV according to patient size and/or use of iterative reconstruction technique. COMPARISON:  None Available. FINDINGS: Brain: No acute intracranial findings are seen. There are no signs of bleeding within the cranium. Cortical sulci are prominent. There is 4 mm calcification in the falx in the image 36 of series 4, possibly dystrophic  dural calcification. Less likely possibility would be a tiny meningioma. There is no adjacent edema or mass effect. Vascular: Unremarkable. Skull: No fracture is seen. Sinuses/Orbits: There is mucosal thickening in maxillary sinuses, more so on the left side. Other: None. IMPRESSION: No acute intracranial findings are seen in noncontrast CT brain. Atrophy. There is 4 mm calcification in falx adjacent to corpus callosum, possibly dystrophic dural calcification. Chronic maxillary sinusitis. Electronically Signed   By: Elmer Picker M.D.   On: 02/16/2022 18:33   DG Knee 2 Views Left  Result Date: 02/16/2022 CLINICAL DATA:  Fall, pain. EXAM: LEFT KNEE - 1-2 VIEW COMPARISON:  None Available. FINDINGS: Status post left knee total arthroplasty. The hardware is intact. No perihardware loosening. No acute fracture. IMPRESSION: Status post left knee total arthroplasty. No acute fracture. Electronically Signed   By: Keane Police D.O.   On: 02/16/2022 17:12      Impression / Plan:   Impression: 1.  Decompensated alcoholic cirrhosis with portal hypertension: Given lactulose in the ED, MELD-Na 29, 40+ years of alcohol history up to 2 bottles of wine a day over the past year or 2, CT with cirrhosis, no prior work-up 2.  Splenic infarct: Discovered during work-up, started on Heparin 3.  AKI: Creatinine 2.05 in the ED (baseline 1.1-1.2),  consider hepatorenal syndrome? 4.  Leukocytosis: Uncertain etiology, given a dose of Cefepime and Flagyl in the ED 5.  Alcohol abuse: Several years ago, 3 to 6 glasses of wine a day now 2-2-1/2 bottles of wine per day, stopped drinking 02/09/2022 without withdrawal symptoms per him 6.  Musculoskeletal pain: Back pain-currently on Robaxin, holding on steroids given leukocytosis and splenic infarct 7.  GERD  Plan: 1.  Agree with CIWA protocol, daily thiamine, folate and multivitamin 2.  Continue to monitor PT/INR/CBC and CMP 3.  At some point patient would benefit from EGD  given new diagnosis of cirrhosis for variceal screening.  This is not emergent at this point and can be done as outpatient. 4.  Need to consider starting diuretics here Lasix/ Spironolactone 5.  Consider possibility of hepatorenal syndrome- continue to monitor labs 6. Will discuss further recommendations with Dr. Bryan Lemma  Thank you for your kind consultation, we will continue to follow.  Lavone Nian Lemmon  02/17/2022, 10:03 AM

## 2022-02-17 NOTE — Progress Notes (Signed)
ANTICOAGULATION CONSULT NOTE - follow up  Pharmacy Consult for heparin  Indication:  splenic infarct   No Known Allergies  Patient Measurements: Height: 5' 7"  (170.2 cm) Weight: 97.5 kg (214 lb 15.2 oz) IBW/kg (Calculated) : 66.1 Heparin Dosing Weight: 87.5kg   Vital Signs: Temp: 97.6 F (36.4 C) (10/19 2103) Temp Source: Oral (10/19 2103) BP: 133/66 (10/19 2103) Pulse Rate: 73 (10/19 2103)  Labs: Recent Labs    02/16/22 1700 02/16/22 1739 02/17/22 0141 02/17/22 1307 02/17/22 2205  HGB 12.4*  --  11.4*  --   --   HCT 36.9*  --  35.0*  --   --   PLT 148*  --  135*  --   --   LABPROT  --  21.0* 23.3*  --   --   INR  --  1.8* 2.1*  --   --   HEPARINUNFRC  --   --  <0.10* 0.16* 0.26*  CREATININE 2.05*  --  2.22*  --   --      Estimated Creatinine Clearance: 36.9 mL/min (A) (by C-G formula based on SCr of 2.22 mg/dL (H)).   Medical History: Past Medical History:  Diagnosis Date   Allergy    Arthritis    Diverticulitis    GERD (gastroesophageal reflux disease)    History of chicken pox    Hypertension    Peri-rectal abscess    Rectal pain     Assessment: Patient admitted for increased pain in legs and weakness. CT abdomen showing moderate sized splenic infarct.  Patient with AKI, Scr: 2.05 baseline around 1.1-1.2.  Hemoglobin stable at 12.4.   Patient not on anticoagulation prior to admission.   02/17/2022 13:07 HL 0.16, subtherapeutic on 1700 units/hr Hgb 11.4, plts 135  2205 HL 0.26 subtherapeutic on 1950 units/hr Per RN no bleeding noted   Goal of Therapy:  Heparin level 0.3-0.7 units/ml Monitor platelets by anticoagulation protocol: Yes   Plan:  Bolus heparin drip 1500 units x 1 Increase heparin drip to 2150 units/hr Heparin level in 6 hours Monitor daily heparin level, CBC, signs/symptoms of bleeding    Thank you for allowing pharmacy to be a part of this patient's care.  Dolly Rias RPh 02/17/2022, 10:33 PM

## 2022-02-17 NOTE — Progress Notes (Signed)
PHARMACY - PHYSICIAN COMMUNICATION CRITICAL VALUE ALERT - BLOOD CULTURE IDENTIFICATION (BCID)  Marc Ponce is an 66 y.o. male who presented to [facility] on 02/16/2022 with a chief complaint of back pain  Assessment:  3/3 bottles Staphylococcus lugdenesis, no resistance gene detected   Name of physician (or Provider) Contacted: [facility] [employer] [facility] via Secure Chat  Current antibiotics: none  Changes to prescribed antibiotics recommended:  Start Cefazolin 2 mg IV q8h  Results for orders placed or performed during the hospital encounter of 02/16/22  Blood Culture ID Panel (Reflexed) (Collected: 02/16/2022  5:39 PM)  Result Value Ref Range   Enterococcus faecalis NOT DETECTED NOT DETECTED   Enterococcus Faecium NOT DETECTED NOT DETECTED   Listeria monocytogenes NOT DETECTED NOT DETECTED   Staphylococcus species DETECTED (A) NOT DETECTED   Staphylococcus aureus (BCID) NOT DETECTED NOT DETECTED   Staphylococcus epidermidis NOT DETECTED NOT DETECTED   Staphylococcus lugdunensis DETECTED (A) NOT DETECTED   Streptococcus species NOT DETECTED NOT DETECTED   Streptococcus agalactiae NOT DETECTED NOT DETECTED   Streptococcus pneumoniae NOT DETECTED NOT DETECTED   Streptococcus pyogenes NOT DETECTED NOT DETECTED   A.calcoaceticus-baumannii NOT DETECTED NOT DETECTED   Bacteroides fragilis NOT DETECTED NOT DETECTED   Enterobacterales NOT DETECTED NOT DETECTED   Enterobacter cloacae complex NOT DETECTED NOT DETECTED   Escherichia coli NOT DETECTED NOT DETECTED   Klebsiella aerogenes NOT DETECTED NOT DETECTED   Klebsiella oxytoca NOT DETECTED NOT DETECTED   Klebsiella pneumoniae NOT DETECTED NOT DETECTED   Proteus species NOT DETECTED NOT DETECTED   Salmonella species NOT DETECTED NOT DETECTED   Serratia marcescens NOT DETECTED NOT DETECTED   Haemophilus influenzae NOT DETECTED NOT DETECTED   Neisseria meningitidis NOT DETECTED NOT DETECTED   Pseudomonas aeruginosa NOT DETECTED NOT  DETECTED   Stenotrophomonas maltophilia NOT DETECTED NOT DETECTED   Candida albicans NOT DETECTED NOT DETECTED   Candida auris NOT DETECTED NOT DETECTED   Candida glabrata NOT DETECTED NOT DETECTED   Candida krusei NOT DETECTED NOT DETECTED   Candida parapsilosis NOT DETECTED NOT DETECTED   Candida tropicalis NOT DETECTED NOT DETECTED   Cryptococcus neoformans/gattii NOT DETECTED NOT DETECTED   Methicillin resistance mecA/C NOT DETECTED NOT DETECTED    Eudelia Bunch, Pharm.D 02/17/2022 4:13 PM

## 2022-02-17 NOTE — Progress Notes (Addendum)
PROGRESS NOTE    Marc Ponce  IHW:[id number] DOB: 18-Dec-1955 DOA: 02/16/2022 PCP: [name], MD    Brief Narrative:  LEIBISH MCGREGOR is a 66 y.o. male with past medical history significant of steatohepatitis, chronic alcohol use, hypertension, hyperlipidemia, GERD, diverticulosis presented to the hospital with increasing low back pain with incontinence.  Patient did have a history of back pain/strain on February 11, 2022. Patient also complains of associated generalized fatigue and fall.  He does have a history of significant alcohol abuse and had to stop stopped drinking on October 11 due to his pain.  In the lab work showed hyponatremia with sodium of 133 creatinine was elevated at 2.0.  Glucose was 164, albumin 2.6, AST 82, alk phos 156, T. bili 8.  CBC showed leukocytosis at 18.5, hemoglobin stable at 12.4, platelets stable at 148.  TSH and T4 normal.  Lactic acid initially elevated to 3.2 but improved to 2.5 with IV fluids.  Ammonia was elevated at 86.  Urinalysis with trace hemoglobin only.   X-ray of the left knee showed no acute abnormality, CT head showed no acute abnormality.  CT of the chest abdomen pelvis showed small bilateral pleural effusions with atelectasis, cirrhosis with portal hypertension and splenomegaly with small ascites.  Also noted was moderate splenic infarct.   Magnesium level mildly elevated at 2.8.  Patient received cefepime, Flagyl, Toradol, morphine, lactulose, heparin, IV fluids, B1 in the ED and was considered for admission to the hospital for further evaluation and treatment.  Assessment and plan.  Principal Problem:   Splenic infarction Active Problems:   Alcohol abuse, daily use   HTN (hypertension)   Hyperlipidemia   Elevated LFTs   GERD (gastroesophageal reflux disease)   Cirrhosis (HCC)   Leukocytosis   AKI (acute kidney injury) ([place])   Splenic infarct Noted to have splenic infarct, on heparin at this time.  Pain management.    AKI Creatinine 1 year ago was around 1.1-1.2.  Creatinine on presentation 2.0.  Creatinine today at 2.2.  Receiving IV fluids.  Likely secondary to NSAID and poor oral intake.  Continue IV fluids.  Continue to hold  losartan-hydrochlorothiazide.  Continue to monitor renal function.   Alcoholic cirrhosis of liver.  On the background of steatohepatitis. MELD Na score 29.  GI has been consulted.  We will follow GI recommendations.  Repeat lactate was 1.9.  We will add lactulose twice daily for adequate bowel movements.   Leukocytosis Had recent use of steroids.  Urinalysis negative.  CT scan of abdomen pelvis without any acute source of infection.  No abdominal pain.  Patient received cefepime and Flagyl in the ED, hold further antibiotic.    Alcohol use Was a  heavy drinker.  Stopped drinking after October 11.  No withdrawal symptoms.  Continue CIWA protocol for now.  Continue thiamine folic acid multivitamin  Musculoskeletal pain   Unclear if splenic infarct could be causing a degree of referred pain as well.  Hold off with further steroids.  Continue Robaxin.  Continue Oxy IR and Dilaudid.  GERD - Continue home PPI   Hypertension Continue to hold losartan and HCTZ.  Continue metoprolol.   Addendum Notified that the blood cultures were positive for staph in 3 bottles.  Pharmacy recommended cefazolin.   Spoke with Dr. Lindi Adie hematooncology.  Recommended changing to Eliquis if no bleeding.  GI on board as well.  We will get 2D echocardiogram to rule out vegetations given the infarct and bacteremia.  DVT prophylaxis:   Heparin drip   Code Status:     Code Status: Full Code  Disposition: Home likely in 1 to 2 days  Status is: Observation  The patient will require care spanning > 2 midnights and should be moved to inpatient because: IV heparin, GI consultation,   Family Communication: None at bedside  Consultants:  GI  Procedures:  None  Antimicrobials:   None  Anti-infectives (From admission, onward)    Start     Dose/Rate Route Frequency Ordered Stop   02/16/22 1845  ceFEPIme (MAXIPIME) 2 g in sodium chloride 0.9 % 100 mL IVPB        2 g 200 mL/hr over 30 Minutes Intravenous  Once 02/16/22 1840 02/16/22 1947   02/16/22 1845  metroNIDAZOLE (FLAGYL) IVPB 500 mg        500 mg 100 mL/hr over 60 Minutes Intravenous  Once 02/16/22 1840 02/16/22 2102      Subjective: Today, patient was seen and examined at bedside.  Feels a little better today.  Has some back pain.  No nausea vomiting or diarrhea.  Feels little more energetic today.  Objective: Vitals:   02/17/22 0149 02/17/22 0500 02/17/22 0543 02/17/22 0825  BP: (!) 128/59  135/61 125/64  Pulse: 69  60 77  Resp: 16  16 16   Temp: (!) 97.4 F (36.3 C)  (!) 97.4 F (36.3 C) 97.9 F (36.6 C)  TempSrc: Oral  Oral Oral  SpO2: 100%  97% 95%  Weight:  97.5 kg      Intake/Output Summary (Last 24 hours) at 02/17/2022 1053 Last data filed at 02/17/2022 1015 Gross per 24 hour  Intake 2048.39 ml  Output --  Net 2048.39 ml   Filed Weights   02/17/22 0500  Weight: 97.5 kg    Physical Examination: Body mass index is 33.67 kg/m.   General: Obese built, not in obvious distress HENT:   Mild icteric, oral mucosa is moist.  Chest:  Clear breath sounds.  Diminished breath sounds bilaterally. No crackles or wheezes.  CVS: S1 &S2 heard. No murmur.  Regular rate and rhythm. Abdomen: Soft, nontender, nondistended.  Bowel sounds are heard.   Extremities: No cyanosis, clubbing bilateral pitting edema noted  Psych: Alert, awake and oriented, normal mood CNS:  No cranial nerve deficits.  Power equal in all extremities.   Skin: Warm and dry.  No rashes noted.  Data Reviewed:   CBC: Recent Labs  Lab 02/16/22 1700 02/17/22 0141  WBC 18.5* 16.3*  NEUTROABS 15.6*  --   HGB 12.4* 11.4*  HCT 36.9* 35.0*  MCV 98.4 100.9*  PLT 148* 135*    Basic Metabolic Panel: Recent Labs  Lab  02/16/22 1700 02/17/22 0141  NA 133* 133*  K 4.4 4.2  CL 101 104  CO2 21* 20*  GLUCOSE 164* 101*  BUN 85* 85*  CREATININE 2.05* 2.22*  CALCIUM 8.9 7.7*  MG 2.8* 2.7*    Liver Function Tests: Recent Labs  Lab 02/16/22 1700 02/17/22 0141  AST 82* 77*  ALT 40 43  ALKPHOS 156* 134*  BILITOT 8.0* 7.0*  PROT 7.2 6.5  ALBUMIN 2.6* 1.8*     Radiology Studies: CT CHEST ABDOMEN PELVIS WO CONTRAST  Result Date: 02/16/2022 CLINICAL DATA:  Right chest pain and severe low back pain. Generalized weakness. EXAM: CT CHEST, ABDOMEN AND PELVIS WITHOUT CONTRAST TECHNIQUE: Multidetector CT imaging of the chest, abdomen and pelvis was performed following the standard protocol without IV contrast. RADIATION DOSE  REDUCTION: This exam was performed according to the departmental dose-optimization program which includes automated exposure control, adjustment of the mA and/or kV according to patient size and/or use of iterative reconstruction technique. COMPARISON:  Pelvic CT dated 05/25/2011 and CT abdomen pelvis dated 04/16/2011. FINDINGS: Evaluation of this exam is limited in the absence of intravenous contrast as well as due to respiratory motion. CT CHEST FINDINGS Cardiovascular: There is no cardiomegaly or pericardial effusion. There is coronary vascular calcification. Mild atherosclerotic calcification of the thoracic aorta. No aneurysmal dilatation. The central pulmonary arteries are grossly unremarkable. Mediastinum/Nodes: No hilar or mediastinal adenopathy. The esophagus is grossly unremarkable. Mediastinal fluid collection. Lungs/Pleura: Small bilateral pleural effusions with minimal partial compressive atelectasis of the lower lobes. Pneumonia is not excluded. Clinical correlation is recommended. No pneumothorax. The central airways are patent. Musculoskeletal: Mild degenerative changes of the spine. Partially visualized left humeral head fixation screws. No acute osseous pathology. CT ABDOMEN PELVIS  FINDINGS No intra-abdominal free air.  Small ascites. Hepatobiliary: Cirrhosis. Indeterminate hepatic hypodense lesions measuring up to 2 cm in the caudate lobe. These can be better characterized with MRI without and with contrast on a nonemergent/outpatient basis. No biliary ductal dilatation. Gallstones. No pericholecystic fluid or evidence of acute cholecystitis by CT. Pancreas: The pancreas is grossly unremarkable. Stranding of the peripancreatic fat, likely related to ascites. Correlation with pancreatic enzymes recommended if there is clinical concern for acute pancreatitis. Spleen: The spleen is enlarged measuring approximately 7 cm in length. There is a large area of hypodensity in the mid to upper pole of the spleen most consistent with infarct. Adrenals/Urinary Tract: The adrenal glands are unremarkable. The kidneys, visualized ureters, and urinary bladder appear unremarkable. Stomach/Bowel: There is sigmoid diverticulosis. There is no bowel obstruction. The appendix is normal. Vascular/Lymphatic: Mild aortoiliac atherosclerotic disease. The IVC is unremarkable. No portal venous gas. There is no adenopathy. Reproductive: The prostate and seminal vesicles are grossly unremarkable. No pelvic mass. Other: Diffuse mesenteric edema. Mild engorgement of the mesenteric vessels. There is enlargement of the umbilical vein. Musculoskeletal: Degenerative changes of the spine. No acute osseous pathology. IMPRESSION: 1. Small bilateral pleural effusions with minimal partial compressive atelectasis of the lower lobes. 2. Cirrhosis with portal hypertension, splenomegaly, and small ascites. 3. Moderate sized splenic infarct. 4. Cholelithiasis. 5. Sigmoid diverticulosis. No bowel obstruction. Normal appendix. 6.  Aortic Atherosclerosis (ICD10-I70.0). Electronically Signed   By: Anner Crete M.D.   On: 02/16/2022 19:06   CT Head Wo Contrast  Result Date: 02/16/2022 CLINICAL DATA:  Recent fall EXAM: CT HEAD WITHOUT  CONTRAST TECHNIQUE: Contiguous axial images were obtained from the base of the skull through the vertex without intravenous contrast. RADIATION DOSE REDUCTION: This exam was performed according to the departmental dose-optimization program which includes automated exposure control, adjustment of the mA and/or kV according to patient size and/or use of iterative reconstruction technique. COMPARISON:  None Available. FINDINGS: Brain: No acute intracranial findings are seen. There are no signs of bleeding within the cranium. Cortical sulci are prominent. There is 4 mm calcification in the falx in the image 36 of series 4, possibly dystrophic dural calcification. Less likely possibility would be a tiny meningioma. There is no adjacent edema or mass effect. Vascular: Unremarkable. Skull: No fracture is seen. Sinuses/Orbits: There is mucosal thickening in maxillary sinuses, more so on the left side. Other: None. IMPRESSION: No acute intracranial findings are seen in noncontrast CT brain. Atrophy. There is 4 mm calcification in falx adjacent to corpus callosum, possibly dystrophic dural  calcification. Chronic maxillary sinusitis. Electronically Signed   By: Elmer Picker M.D.   On: 02/16/2022 18:33   DG Knee 2 Views Left  Result Date: 02/16/2022 CLINICAL DATA:  Fall, pain. EXAM: LEFT KNEE - 1-2 VIEW COMPARISON:  None Available. FINDINGS: Status post left knee total arthroplasty. The hardware is intact. No perihardware loosening. No acute fracture. IMPRESSION: Status post left knee total arthroplasty. No acute fracture. Electronically Signed   By: Keane Police D.O.   On: 02/16/2022 17:12      LOS: 0 days    Flora Lipps, MD [facility] Available via Epic secure chat 7am-7pm After these hours, please refer to coverage provider listed on [email or web address] 02/17/2022, 10:53 AM

## 2022-02-17 NOTE — Progress Notes (Signed)
ANTICOAGULATION CONSULT NOTE - follow up  Pharmacy Consult for heparin  Indication:  splenic infarct   No Known Allergies  Patient Measurements:   Heparin Dosing Weight: 87.5kg   Vital Signs: Temp: 97.4 F (36.3 C) (10/19 0149) Temp Source: Oral (10/19 0149) BP: 128/59 (10/19 0149) Pulse Rate: 69 (10/19 0149)  Labs: Recent Labs    02/16/22 1700 02/16/22 1739 02/17/22 0141  HGB 12.4*  --  11.4*  HCT 36.9*  --  35.0*  PLT 148*  --  135*  LABPROT  --  21.0* 23.3*  INR  --  1.8* 2.1*  HEPARINUNFRC  --   --  <0.10*  CREATININE 2.05*  --  2.22*     Estimated Creatinine Clearance: 37.2 mL/min (A) (by C-G formula based on SCr of 2.22 mg/dL (H)).   Medical History: Past Medical History:  Diagnosis Date   Allergy    Arthritis    Diverticulitis    GERD (gastroesophageal reflux disease)    History of chicken pox    Hypertension    Peri-rectal abscess    Rectal pain     Assessment: Patient admitted for increased pain in legs and weakness. CT abdomen showing moderate sized splenic infarct.  Patient with AKI, Scr: 2.05 baseline around 1.1-1.2.  Hemoglobin stable at 12.4.   Patient not on anticoagulation prior to admission.   02/17/2022 HL <0.1, subtherapeutic on 1400 units/hr Hgb 11.4, plts 135 No bleeding or line interruptions noted  Goal of Therapy:  Heparin level 0.3-0.7 units/ml Monitor platelets by anticoagulation protocol: Yes   Plan:  Bolus heparin drip 3000 units x 1 Increase heparin drip to 1700 units/hr Heparin level in 6 hours Daily CBC  Dolly Rias RPh 02/17/2022, 4:28 AM

## 2022-02-17 NOTE — Progress Notes (Signed)
ANTICOAGULATION CONSULT NOTE - follow up  Pharmacy Consult for heparin  Indication:  splenic infarct   No Known Allergies  Patient Measurements: Weight: 97.5 kg (214 lb 15.2 oz) Heparin Dosing Weight: 87.5kg   Vital Signs: Temp: 98.1 F (36.7 C) (10/19 1245) Temp Source: Oral (10/19 1245) BP: 110/49 (10/19 1245) Pulse Rate: 71 (10/19 1245)  Labs: Recent Labs    02/16/22 1700 02/16/22 1739 02/17/22 0141 02/17/22 1307  HGB 12.4*  --  11.4*  --   HCT 36.9*  --  35.0*  --   PLT 148*  --  135*  --   LABPROT  --  21.0* 23.3*  --   INR  --  1.8* 2.1*  --   HEPARINUNFRC  --   --  <0.10* 0.16*  CREATININE 2.05*  --  2.22*  --      Estimated Creatinine Clearance: 36.9 mL/min (A) (by C-G formula based on SCr of 2.22 mg/dL (H)).   Medical History: Past Medical History:  Diagnosis Date   Allergy    Arthritis    Diverticulitis    GERD (gastroesophageal reflux disease)    History of chicken pox    Hypertension    Peri-rectal abscess    Rectal pain     Assessment: Patient admitted for increased pain in legs and weakness. CT abdomen showing moderate sized splenic infarct.  Patient with AKI, Scr: 2.05 baseline around 1.1-1.2.  Hemoglobin stable at 12.4.   Patient not on anticoagulation prior to admission.   02/17/2022 13:07 HL 0.16, subtherapeutic on 1700 units/hr Hgb 11.4, plts 135 No bleeding or line interruptions per nurse  Goal of Therapy:  Heparin level 0.3-0.7 units/ml Monitor platelets by anticoagulation protocol: Yes   Plan:  Bolus heparin drip 3000 units x 1 Increase heparin drip to 1950 units/hr Heparin level in 6 hours Monitor daily heparin level, CBC, signs/symptoms of bleeding    Thank you for allowing pharmacy to be a part of this patient's care.  Royetta Asal, PharmD, BCPS Clinical Pharmacist [place] Please utilize Amion for appropriate phone number to reach the unit pharmacist ([place]) 02/17/2022 2:54 PM

## 2022-02-17 NOTE — TOC Initial Note (Signed)
Transition of Care [facility]) - Initial/Assessment Note    Patient Details  Name: Marc Ponce MRN: [id number] Date of Birth: [birth year]  [facility] CM/SW Contact:    Vassie Moselle, LCSW Phone Number: 02/17/2022, 9:52 AM  Clinical Narrative:                 TOC consulted for SA counseling/education. Met with pt who shares that he drinks 2 to 2-1/2 bottles of liquor/wine a night. Pt does states that his alcohol use has caused issues with his functioning and his daily life. Pt is receptive of having resources for outpatient substance use counseling. Resources have been added to pt's AVS.   Expected Discharge Plan: Home/Self Care Barriers to Discharge: No Barriers Identified   Patient Goals and CMS Choice Patient states their goals for this hospitalization and ongoing recovery are:: To return home CMS Medicare.gov Compare Post Acute Care list provided to:: Patient Choice offered to / list presented to : Patient  Expected Discharge Plan and Services Expected Discharge Plan: Home/Self Care In-house Referral: NA Discharge Planning Services: CM Consult Post Acute Care Choice: NA Living arrangements for the past 2 months: Single Family Home                 DME Arranged: N/A DME Agency: NA                  Prior Living Arrangements/Services Living arrangements for the past 2 months: Single Family Home Lives with:: Spouse Patient language and need for interpreter reviewed:: Yes Do you feel safe going back to the place where you live?: Yes      Need for Family Participation in Patient Care: No (Comment) Care giver support system in place?: No (comment)   Criminal Activity/Legal Involvement Pertinent to Current Situation/Hospitalization: No - Comment as needed  Activities of Daily Living Home Assistive Devices/Equipment: None ADL Screening (condition at time of admission) Patient's cognitive ability adequate to safely complete daily activities?: Yes Is  the patient deaf or have difficulty hearing?: No Does the patient have difficulty seeing, even when wearing glasses/contacts?: No Does the patient have difficulty concentrating, remembering, or making decisions?: No Patient able to express need for assistance with ADLs?: Yes Does the patient have difficulty dressing or bathing?: Yes Independently performs ADLs?: No Communication: Independent Dressing (OT): Needs assistance Is this a change from baseline?: Pre-admission baseline Grooming: Independent Feeding: Independent Bathing: Needs assistance Is this a change from baseline?: Pre-admission baseline Toileting: Needs assistance Is this a change from baseline?: Pre-admission baseline In/Out Bed: Needs assistance Is this a change from baseline?: Pre-admission baseline Walks in Home: Needs assistance Is this a change from baseline?: Pre-admission baseline Does the patient have difficulty walking or climbing stairs?: Yes Weakness of Legs: Both Weakness of Arms/Hands: None  Permission Sought/Granted   Permission granted to share information with : No              Emotional Assessment Appearance:: Appears stated age Attitude/Demeanor/Rapport: Engaged Affect (typically observed): Accepting Orientation: : Oriented to Self, Oriented to Place, Oriented to  Time, Oriented to Situation Alcohol / Substance Use: Alcohol Use Psych Involvement: No (comment)  Admission diagnosis:  Cirrhosis (HCC) [K74.60] Weakness [R53.1] Thrombocytopenia (HCC) [D69.6] Splenic infarct [D73.5] AKI (acute kidney injury) ([place]) [N17.9] Cirrhosis of liver with ascites, unspecified hepatic cirrhosis type ([place]) [K74.60, R18.8] Patient Active Problem List   Diagnosis Date Noted   AKI (acute kidney injury) ([place]) 02/17/2022   Cirrhosis ([place]) 02/16/2022  Splenic infarction 02/16/2022   Leukocytosis 02/16/2022   GERD (gastroesophageal reflux disease) 12/26/2017   History of cellulitis 12/26/2017   History of  arthroplasty of left knee 05/26/2017   Pain in left knee 05/10/2017   Elevated LFTs 04/21/2017   HTN (hypertension) 03/12/2013   Hyperlipidemia 03/12/2013   Steatohepatitis 04/19/2011   Alcohol abuse, daily use 04/19/2011   PCP:  [name], MD Pharmacy:   [id number] [id number]: [phone]: [phone]    Social Determinants of Health (SDOH) Interventions Housing Interventions: Patient Refused  Readmission Risk Interventions     No data to display

## 2022-02-17 NOTE — Hospital Course (Addendum)
Marc Ponce is a 66 y.o. male with past medical history significant of steatohepatitis, chronic alcohol use, hypertension, hyperlipidemia, GERD, diverticulosis presented to the hospital with increasing low back pain with incontinence.  Patient did have a history of back pain/strain on February 11, 2022. Patient also complains of associated generalized fatigue and fall.  He does have a history of significant alcohol abuse and had to stop stopped drinking on October 11 due to his pain.  In the lab work showed hyponatremia with sodium of 133 creatinine was elevated at 2.0.  Glucose was 164, albumin 2.6, AST 82, alk phos 156, T. bili 8.  CBC showed leukocytosis at 18.5, hemoglobin stable at 12.4, platelets stable at 148.  TSH and T4 normal.  Lactic acid initially elevated to 3.2 but improved to 2.5 with IV fluids.  Ammonia was elevated at 86.  Urinalysis with trace hemoglobin only.   X-ray of the left knee showed no acute abnormality, CT head showed no acute abnormality.  CT of the chest abdomen pelvis showed small bilateral pleural effusions with atelectasis, cirrhosis with portal hypertension and splenomegaly with small ascites.  Also noted was moderate splenic infarct.  .  Magnesium level mildly elevated at 2.8.  Patient received cefepime, Flagyl, Toradol, morphine, lactulose, heparin, IV fluids, B1 in the ED and was considered for admission to the hospital for further evaluation and treatment.

## 2022-02-18 ENCOUNTER — Inpatient Hospital Stay (HOSPITAL_COMMUNITY): Payer: Medicare Other

## 2022-02-18 DIAGNOSIS — K921 Melena: Secondary | ICD-10-CM

## 2022-02-18 DIAGNOSIS — K7031 Alcoholic cirrhosis of liver with ascites: Secondary | ICD-10-CM | POA: Diagnosis not present

## 2022-02-18 DIAGNOSIS — D62 Acute posthemorrhagic anemia: Secondary | ICD-10-CM

## 2022-02-18 DIAGNOSIS — K7030 Alcoholic cirrhosis of liver without ascites: Secondary | ICD-10-CM | POA: Diagnosis not present

## 2022-02-18 DIAGNOSIS — N179 Acute kidney failure, unspecified: Secondary | ICD-10-CM | POA: Diagnosis not present

## 2022-02-18 DIAGNOSIS — K922 Gastrointestinal hemorrhage, unspecified: Secondary | ICD-10-CM

## 2022-02-18 DIAGNOSIS — F1010 Alcohol abuse, uncomplicated: Secondary | ICD-10-CM | POA: Diagnosis not present

## 2022-02-18 DIAGNOSIS — D735 Infarction of spleen: Secondary | ICD-10-CM | POA: Diagnosis not present

## 2022-02-18 LAB — PROTIME-INR
INR: 2.3 — ABNORMAL HIGH (ref 0.8–1.2)
Prothrombin Time: 24.9 seconds — ABNORMAL HIGH (ref 11.4–15.2)

## 2022-02-18 LAB — CBC
HCT: 28.7 % — ABNORMAL LOW (ref 39.0–52.0)
Hemoglobin: 9.3 g/dL — ABNORMAL LOW (ref 13.0–17.0)
MCH: 33.3 pg (ref 26.0–34.0)
MCHC: 32.4 g/dL (ref 30.0–36.0)
MCV: 102.9 fL — ABNORMAL HIGH (ref 80.0–100.0)
Platelets: 114 10*3/uL — ABNORMAL LOW (ref 150–400)
RBC: 2.79 MIL/uL — ABNORMAL LOW (ref 4.22–5.81)
RDW: 18.4 % — ABNORMAL HIGH (ref 11.5–15.5)
WBC: 20.7 10*3/uL — ABNORMAL HIGH (ref 4.0–10.5)
nRBC: 0.1 % (ref 0.0–0.2)

## 2022-02-18 LAB — COMPREHENSIVE METABOLIC PANEL
ALT: 32 U/L (ref 0–44)
AST: 56 U/L — ABNORMAL HIGH (ref 15–41)
Albumin: 1.6 g/dL — ABNORMAL LOW (ref 3.5–5.0)
Alkaline Phosphatase: 112 U/L (ref 38–126)
Anion gap: 7 (ref 5–15)
BUN: 107 mg/dL — ABNORMAL HIGH (ref 8–23)
CO2: 18 mmol/L — ABNORMAL LOW (ref 22–32)
Calcium: 7.7 mg/dL — ABNORMAL LOW (ref 8.9–10.3)
Chloride: 110 mmol/L (ref 98–111)
Creatinine, Ser: 2.19 mg/dL — ABNORMAL HIGH (ref 0.61–1.24)
GFR, Estimated: 33 mL/min — ABNORMAL LOW (ref >60)
Glucose, Bld: 125 mg/dL — ABNORMAL HIGH (ref 70–99)
Potassium: 4.2 mmol/L (ref 3.5–5.1)
Sodium: 135 mmol/L (ref 135–145)
Total Bilirubin: 5.6 mg/dL — ABNORMAL HIGH (ref 0.3–1.2)
Total Protein: 5.7 g/dL — ABNORMAL LOW (ref 6.5–8.1)

## 2022-02-18 LAB — HEMOGLOBIN AND HEMATOCRIT, BLOOD
HCT: 28.4 % — ABNORMAL LOW (ref 39.0–52.0)
HCT: 29.7 % — ABNORMAL LOW (ref 39.0–52.0)
Hemoglobin: 9.4 g/dL — ABNORMAL LOW (ref 13.0–17.0)
Hemoglobin: 9.5 g/dL — ABNORMAL LOW (ref 13.0–17.0)

## 2022-02-18 LAB — TYPE AND SCREEN
ABO/RH(D): O POS
Antibody Screen: NEGATIVE

## 2022-02-18 LAB — HIV ANTIBODY (ROUTINE TESTING W REFLEX): HIV Screen 4th Generation wRfx: NONREACTIVE

## 2022-02-18 LAB — MAGNESIUM: Magnesium: 2.7 mg/dL — ABNORMAL HIGH (ref 1.7–2.4)

## 2022-02-18 LAB — HEPARIN LEVEL (UNFRACTIONATED): Heparin Unfractionated: 0.24 IU/mL — ABNORMAL LOW (ref 0.30–0.70)

## 2022-02-18 LAB — ABO/RH: ABO/RH(D): O POS

## 2022-02-18 MED ORDER — PANTOPRAZOLE SODIUM 40 MG IV SOLR
40.0000 mg | Freq: Two times a day (BID) | INTRAVENOUS | Status: DC
  Administered 2022-02-18 – 2022-02-23 (×11): 40 mg via INTRAVENOUS
  Filled 2022-02-18 (×12): qty 10

## 2022-02-18 MED ORDER — HEPARIN (PORCINE) 25000 UT/250ML-% IV SOLN: 2400.0000 [IU]/h | INTRAVENOUS | Status: DC

## 2022-02-18 MED ORDER — HEPARIN BOLUS VIA INFUSION
2000.0000 [IU] | Freq: Once | INTRAVENOUS | Status: DC
  Filled 2022-02-18: qty 2000

## 2022-02-18 NOTE — Progress Notes (Addendum)
ANTICOAGULATION CONSULT NOTE - follow up  Pharmacy Consult for heparin  Indication:  splenic infarct   No Known Allergies  Patient Measurements: Height: 5' 7"  (170.2 cm) Weight: 98 kg (216 lb 0.8 oz) IBW/kg (Calculated) : 66.1 Heparin Dosing Weight: 87.5kg   Vital Signs: Temp: 98.6 F (37 C) (10/20 0423) Temp Source: Oral (10/20 0423) BP: 105/51 (10/20 0423) Pulse Rate: 91 (10/20 0423)  Labs: Recent Labs    02/16/22 1700 02/16/22 1700 02/16/22 1739 02/17/22 0141 02/17/22 1307 02/17/22 2205 02/18/22 0528  HGB 12.4*  --   --  11.4*  --   --  9.3*  HCT 36.9*  --   --  35.0*  --   --  28.7*  PLT 148*  --   --  135*  --   --  114*  LABPROT  --   --  21.0* 23.3*  --   --  24.9*  INR  --   --  1.8* 2.1*  --   --  2.3*  HEPARINUNFRC  --    < >  --  <0.10* 0.16* 0.26* 0.24*  CREATININE 2.05*  --   --  2.22*  --   --   --    < > = values in this interval not displayed.     Estimated Creatinine Clearance: 37 mL/min (A) (by C-G formula based on SCr of 2.22 mg/dL (H)).   Medical History: Past Medical History:  Diagnosis Date   Allergy    Arthritis    Diverticulitis    GERD (gastroesophageal reflux disease)    History of chicken pox    Hypertension    Peri-rectal abscess    Rectal pain     Assessment: Patient presented on 10/18 for increased pain in legs and weakness. CT abdomen showing moderate sized splenic infarct. He is admitted with AKI, Cirrhosis.  Pharmacy is consulted to dose Heparin.  Patient not on anticoagulation prior to admission.   02/18/2022 Heparin level 0.24, remains subtherapeutic and decreased on Heparin 2150 units/hr despite previous rate increase.   - RN confirms that heparin is running at correct rate, IV site looks good, and IV is infusing well.   CBC: Hgb down to 9.3, Plt down to 114k. No bleeding or complications reported   Goal of Therapy:  Heparin level 0.3-0.7 units/ml Monitor platelets by anticoagulation protocol: Yes   Plan:   Bolus heparin drip 2000 units x 1  Increase heparin drip to 2400 units/hr  Heparin level in 6 hours Monitor daily heparin level, CBC, signs/symptoms of bleeding   Thank you for allowing pharmacy to be a part of this patient's care.  Gretta Arab PharmD, BCPS [facility] [phone] 02/18/2022 7:31 AM      ADDENDUM: Patient noted to have tarry and bloody BM per RN and NT report.  Heparin held per discussion with TR[facility].  Plan to follow up GI consult.   Heparin orders and consult d/t at this time.   Please re-consult pharmacy if/when safe to resume Heparin.    Gretta Arab PharmD, BCPS [facility] [phone] 02/18/2022 10:12 AM

## 2022-02-18 NOTE — H&P (View-Only) (Signed)
Attending physician's note   I have taken a history, reviewed the chart, and examined the patient. I performed a substantive portion of this encounter, including complete performance of at least one of the key components, in conjunction with the APP. I agree with the APP's note, impression, and recommendations with my edits.   Spouse present at bedside today.  More confusion today.  Has had some dark stools reported by hospital staff, but patient cannot recall.  Hemoglobin 9.3, down from 11.4 yesterday.  WBC 20.7, up from 16 yesterday.  Not much change in renal function with creatinine 2.2 (same as yesterday).  - Continue serial CBC checks with blood products as needed per protocol - Clears okay today with n.p.o. at midnight - Agree with high-dose IV PPI - EGD tomorrow for diagnostic and therapeutic intent - Heparin GTT stopped.  Recommend formal Hematology input on whether or not anticoagulation is needed for splenic infarct of unknown duration - Continue IV albumin and reassess renal function tomorrow.  If no improvement, plan for midodrine, octreotide, and Nephrology consult for consideration of HRS - Holding ARB - Will eventually need MRI three-phase liver without and with contrast - Per ID service, plan for MRI L-spine and TTE +/- TEE due to staph infection and low back pain with concern for lumbar infection - CIWA  The indications, risks, and benefits of EGD were explained to the patient and family member in detail. Risks include but are not limited to bleeding, perforation, adverse reaction to medications, and cardiopulmonary compromise. Sequelae include but are not limited to the possibility of surgery, hospitalization, and mortality. The patient verbalized understanding and wished to proceed.    [place], DO, FACG [phone] office          Progress Note   Subjective  Day #2 Chief Complaint: Cirrhosis, abdominal pain, GI bleed  Today, the patient tells me  that much not much has changed.  His wife is in the room today tells me she feels like he is not as clearheaded as he was yesterday and seems more confused.  He tells me he generally just "feels worse".  Tells me he had a bowel movement this morning but did not tell me that it was some bright red blood and black tarry stool (reported by nursing later).  Denies any other new complaints or concerns, continues with back pain.   Objective   Vital signs in last 24 hours: Temp:  [97.6 F (36.4 C)-98.6 F (37 C)] 98.6 F (37 C) (10/20 0423) Pulse Rate:  [70-91] 91 (10/20 0423) Resp:  [16-22] 22 (10/20 0423) BP: (105-133)/(47-66) 105/51 (10/20 0423) SpO2:  [94 %-97 %] 94 % (10/20 0423) Weight:  [97.5 kg-98 kg] 98 kg (10/20 0500) Last BM Date : 02/17/22 General:   Chronically ill-appearing Caucasian male in NAD Heart:  Regular rate and rhythm; no murmurs Lungs: Respirations even and unlabored, lungs CTA bilaterally Abdomen:  Soft, nontender and nondistended. Normal bowel sounds. Psych:  Cooperative. Normal mood and affect.  Intake/Output from previous day: 10/19 0701 - 10/20 0700 In: 1106.8 [I.V.:956.8; IV Piggyback:150] Out: -    Lab Results: Recent Labs    02/16/22 1700 02/17/22 0141 02/18/22 0528  WBC 18.5* 16.3* 20.7*  HGB 12.4* 11.4* 9.3*  HCT 36.9* 35.0* 28.7*  PLT 148* 135* 114*   BMET Recent Labs    02/16/22 1700 02/17/22 0141 02/18/22 0528  NA 133* 133* 135  K 4.4 4.2 4.2  CL 101 104 110  CO2 21* 20* 18*  GLUCOSE 164* 101* 125*  BUN 85* 85* 107*  CREATININE 2.05* 2.22* 2.19*  CALCIUM 8.9 7.7* 7.7*   LFT Recent Labs    02/18/22 0528  PROT 5.7*  ALBUMIN 1.6*  AST 56*  ALT 32  ALKPHOS 112  BILITOT 5.6*   PT/INR Recent Labs    02/17/22 0141 02/18/22 0528  LABPROT 23.3* 24.9*  INR 2.1* 2.3*    Studies/Results: CT CHEST ABDOMEN PELVIS WO CONTRAST  Result Date: 02/16/2022 CLINICAL DATA:  Right chest pain and severe low back pain. Generalized  weakness. EXAM: CT CHEST, ABDOMEN AND PELVIS WITHOUT CONTRAST TECHNIQUE: Multidetector CT imaging of the chest, abdomen and pelvis was performed following the standard protocol without IV contrast. RADIATION DOSE REDUCTION: This exam was performed according to the departmental dose-optimization program which includes automated exposure control, adjustment of the mA and/or kV according to patient size and/or use of iterative reconstruction technique. COMPARISON:  Pelvic CT dated 05/25/2011 and CT abdomen pelvis dated 04/16/2011. FINDINGS: Evaluation of this exam is limited in the absence of intravenous contrast as well as due to respiratory motion. CT CHEST FINDINGS Cardiovascular: There is no cardiomegaly or pericardial effusion. There is coronary vascular calcification. Mild atherosclerotic calcification of the thoracic aorta. No aneurysmal dilatation. The central pulmonary arteries are grossly unremarkable. Mediastinum/Nodes: No hilar or mediastinal adenopathy. The esophagus is grossly unremarkable. Mediastinal fluid collection. Lungs/Pleura: Small bilateral pleural effusions with minimal partial compressive atelectasis of the lower lobes. Pneumonia is not excluded. Clinical correlation is recommended. No pneumothorax. The central airways are patent. Musculoskeletal: Mild degenerative changes of the spine. Partially visualized left humeral head fixation screws. No acute osseous pathology. CT ABDOMEN PELVIS FINDINGS No intra-abdominal free air.  Small ascites. Hepatobiliary: Cirrhosis. Indeterminate hepatic hypodense lesions measuring up to 2 cm in the caudate lobe. These can be better characterized with MRI without and with contrast on a nonemergent/outpatient basis. No biliary ductal dilatation. Gallstones. No pericholecystic fluid or evidence of acute cholecystitis by CT. Pancreas: The pancreas is grossly unremarkable. Stranding of the peripancreatic fat, likely related to ascites. Correlation with pancreatic  enzymes recommended if there is clinical concern for acute pancreatitis. Spleen: The spleen is enlarged measuring approximately 7 cm in length. There is a large area of hypodensity in the mid to upper pole of the spleen most consistent with infarct. Adrenals/Urinary Tract: The adrenal glands are unremarkable. The kidneys, visualized ureters, and urinary bladder appear unremarkable. Stomach/Bowel: There is sigmoid diverticulosis. There is no bowel obstruction. The appendix is normal. Vascular/Lymphatic: Mild aortoiliac atherosclerotic disease. The IVC is unremarkable. No portal venous gas. There is no adenopathy. Reproductive: The prostate and seminal vesicles are grossly unremarkable. No pelvic mass. Other: Diffuse mesenteric edema. Mild engorgement of the mesenteric vessels. There is enlargement of the umbilical vein. Musculoskeletal: Degenerative changes of the spine. No acute osseous pathology. IMPRESSION: 1. Small bilateral pleural effusions with minimal partial compressive atelectasis of the lower lobes. 2. Cirrhosis with portal hypertension, splenomegaly, and small ascites. 3. Moderate sized splenic infarct. 4. Cholelithiasis. 5. Sigmoid diverticulosis. No bowel obstruction. Normal appendix. 6.  Aortic Atherosclerosis (ICD10-I70.0). Electronically Signed   By: Anner Crete M.D.   On: 02/16/2022 19:06   CT Head Wo Contrast  Result Date: 02/16/2022 CLINICAL DATA:  Recent fall EXAM: CT HEAD WITHOUT CONTRAST TECHNIQUE: Contiguous axial images were obtained from the base of the skull through the vertex without intravenous contrast. RADIATION DOSE REDUCTION: This exam was performed according to the departmental dose-optimization program which includes  automated exposure control, adjustment of the mA and/or kV according to patient size and/or use of iterative reconstruction technique. COMPARISON:  None Available. FINDINGS: Brain: No acute intracranial findings are seen. There are no signs of bleeding within  the cranium. Cortical sulci are prominent. There is 4 mm calcification in the falx in the image 36 of series 4, possibly dystrophic dural calcification. Less likely possibility would be a tiny meningioma. There is no adjacent edema or mass effect. Vascular: Unremarkable. Skull: No fracture is seen. Sinuses/Orbits: There is mucosal thickening in maxillary sinuses, more so on the left side. Other: None. IMPRESSION: No acute intracranial findings are seen in noncontrast CT brain. Atrophy. There is 4 mm calcification in falx adjacent to corpus callosum, possibly dystrophic dural calcification. Chronic maxillary sinusitis. Electronically Signed   By: Elmer Picker M.D.   On: 02/16/2022 18:33   DG Knee 2 Views Left  Result Date: 02/16/2022 CLINICAL DATA:  Fall, pain. EXAM: LEFT KNEE - 1-2 VIEW COMPARISON:  None Available. FINDINGS: Status post left knee total arthroplasty. The hardware is intact. No perihardware loosening. No acute fracture. IMPRESSION: Status post left knee total arthroplasty. No acute fracture. Electronically Signed   By: Keane Police D.O.   On: 02/16/2022 17:12      Assessment / Plan:   Assessment: 1.  Decompensated alcoholic cirrhosis with portal hypertension: MELD-NA 29, 40+ years of alcohol history, CT with cirrhosis with thrombocytopenia and signs of portal hypertension on imaging, now with GI bleed; can Zurn for varices versus other 2.  Splenic infarct: 3.  AKI: Could represent HRS, started on Albumin yesterday, will get another dose this afternoon, if not responding would recommend nephrology consultation 4.  Leukocytosis 5.  Alcohol abuse 6.  Musculoskeletal pain 7.  GERD 8.  GI bleed: Started this morning 02/18/2022 report of melena/bright red blood in the stool; concern for upper GI source including possibly varices  Plan: 1.  Given new onset of melena hematochezia today and a 2 g drop in hemoglobin we will plan on EGD tomorrow for further evaluation. 2.  Agree with  changing Pantoprazole to IV twice daily 3.  Discontinue heparin for now 4.  Continue to monitor hemoglobin every 6-8 hours with transfusion as needed less than 7 5.  Continue other supportive medications 6.  Continue to monitor PT/INR, CBC and CMP 7.  Again holding on diuretics given AKI/HRS, patient started on Albumin yesterday, second dose this afternoon, if not responding would recommend consulting nephrology tomorrow morning 8.  Will eventually need MRI 3-phase liver without and with contrast for [facility] screening and evaluation of indeterminate 2 cm hypodense lesion in the caudate lobe 9.  CIWA protocol 10.  Agree with consulting hematology in regards to splenic infarct and need for anticoagulation, obviously on hold at the moment given GI bleed 11.  Continue antibiotics  Thank you for your kind consultation, we will continue to follow    LOS: 1 day   Levin Erp  02/18/2022, 10:35 AM

## 2022-02-18 NOTE — Progress Notes (Signed)
Mobility Specialist - Progress Note   02/18/22 1358  Mobility  Activity Stood at bedside  Level of Assistance Minimal assist, patient does 75% or more  Assistive Device None  Distance Ambulated (ft) 0 ft  Activity Response Tolerated well  $Mobility charge 1 Mobility   Pt received in bed and requested assistance to restroom, once standing pt did not have the urge to use restroom. Pt back sitting EOB with all needs met and family in room.   Roderick Pee Mobility Specialist

## 2022-02-18 NOTE — Progress Notes (Signed)
Mobility Specialist - Progress Note   02/18/22 1000  Mobility  Activity Ambulated with assistance in hallway  Level of Assistance Contact guard assist, steadying assist  Assistive Device Other (Comment) (IV pole)  Distance Ambulated (ft) 50 ft  Activity Response Tolerated fair  Mobility Referral Yes  $Mobility charge 1 Mobility   Pt received in chair and requested assistance ambulating to restroom. No c/o pain nor discomfort. Stool was in chair after standing and was black and tarry with some bright blood smears. Nothing in toilet to flush. Pt back to chair with all needs met and staff in room.   Roderick Pee Mobility Specialist

## 2022-02-18 NOTE — Progress Notes (Signed)
PROGRESS NOTE    DWYANE Ponce  HWK:[id number] DOB: 09-20-1955 DOA: 02/16/2022 PCP: [name], MD    Brief Narrative:  Marc Ponce is a 66 y.o. male with past medical history significant of steatohepatitis, chronic alcohol use, hypertension, hyperlipidemia, GERD, diverticulosis presented to the hospital with increasing low back pain with incontinence.  Patient did have a history of back pain/strain on February 11, 2022. Patient also complains of associated generalized fatigue and fall.  He does have a history of significant alcohol abuse and had to stop stopped drinking on October 11 due to his pain.  In the lab work showed hyponatremia with sodium of 133 creatinine was elevated at 2.0.  Glucose was 164, albumin 2.6, AST 82, alk phos 156, T. bili 8.  CBC showed leukocytosis at 18.5, hemoglobin stable at 12.4, platelets stable at 148.  TSH and T4 normal.  Lactic acid initially elevated to 3.2 but improved to 2.5 with IV fluids.  Ammonia was elevated at 86.  Urinalysis with trace hemoglobin only.   X-ray of the left knee showed no acute abnormality, CT head showed no acute abnormality.  CT of the chest abdomen pelvis showed small bilateral pleural effusions with atelectasis, cirrhosis with portal hypertension and splenomegaly with small ascites.  Also noted was moderate splenic infarct.   Magnesium level mildly elevated at 2.8.  Patient received cefepime, Flagyl, Toradol, morphine, lactulose, heparin, IV fluids, B1 in the ED and was considered for admission to the hospital for further evaluation and treatment.  Assessment and plan.  Principal Problem:   Splenic infarction Active Problems:   Alcohol abuse, daily use   HTN (hypertension)   Hyperlipidemia   Elevated LFTs   GERD (gastroesophageal reflux disease)   Cirrhosis (HCC)   Leukocytosis   AKI (acute kidney injury) ([place])   Splenic infarct   Thrombocytopenia (HCC)   Splenic infarct Noted to have splenic infarct, received  IV heparin drip but at this time has been having some GI bleed so on hold.  2D echocardiogram ordered to rule out vegetations.   AKI  Likely secondary to NSAID and poor oral intake.  Could not rule out hepatorenal syndrome.  Patient received albumin as per GI.  Creatinine 1 year ago was around 1.1-1.2.  Creatinine on presentation 2.0.  Creatinine today at 2.1.  Received IV fluids.  Continue to hold  losartan-hydrochlorothiazide.  Continue to monitor renal function.   Alcoholic cirrhosis of liver with no melena or bright red blood..  On the background of steatohepatitis.  Plan for EGD as per GI.  We will add Protonix IV twice daily.  Discontinue IV heparin drip.  H&H every 6 and type and cross. MELD Na score 29.  GI on board. Repeat lactate was 1.9.  Continue lactulose.  Leukocytosis, bacteremia. Had recent use of steroids.  Urinalysis negative.  CT scan of abdomen pelvis without any acute source of infection.  No abdominal pain.  Patient received cefepime and Flagyl in the ED but had bacteremia so currently on cefazolin.  Alcohol use Was a  heavy drinker.  Stopped drinking after February 09, 2022.  No withdrawal symptoms.  Continue CIWA protocol for now.  Continue thiamine, folic acid and multivitamin  Musculoskeletal pain   Unclear if splenic infarct could be causing a degree of referred pain as well.  Hold off with further steroids.  Continue Robaxin.    GERD - Continue home PPI   Hypertension Continue to hold losartan and HCTZ.  Continue metoprolol.  DVT prophylaxis:   Hold heparin drip   Code Status:     Code Status: Full Code  Disposition: Home with home health in 2 to 3 days  Status is: Inpatient  The patient is inpatient because: GI bleed, cirrhosis of liver, splenic infarct,   Family Communication: Communicated with the patient's family at bedside.  Consultants:  GI Oncology Dr. Lindi Adie verbal consult  Procedures:  None yet  Antimicrobials:   None  Anti-infectives (From admission, onward)    Start     Dose/Rate Route Frequency Ordered Stop   02/17/22 1800  ceFAZolin (ANCEF) IVPB 2g/100 mL premix        2 g 200 mL/hr over 30 Minutes Intravenous Every 8 hours 02/17/22 1607     02/16/22 1845  ceFEPIme (MAXIPIME) 2 g in sodium chloride 0.9 % 100 mL IVPB        2 g 200 mL/hr over 30 Minutes Intravenous  Once 02/16/22 1840 02/16/22 1947   02/16/22 1845  metroNIDAZOLE (FLAGYL) IVPB 500 mg        500 mg 100 mL/hr over 60 Minutes Intravenous  Once 02/16/22 1840 02/16/22 2102      Subjective:  Today, patient was seen and examined at bedside.  Nursing staff reported that patient did have some melanotic stool and bright red blood per rectum.  Patient states that he has a history of hemorrhoids and has been constipated.  Denies any nausea vomiting fever or chills.    Objective: Vitals:   02/17/22 2013 02/17/22 2103 02/18/22 0423 02/18/22 0500  BP: (!) 110/47 133/66 (!) 105/51   Pulse: 77 73 91   Resp: 16 20 (!) 22   Temp: 98.4 F (36.9 C) 97.6 F (36.4 C) 98.6 F (37 C)   TempSrc: Oral Oral Oral   SpO2: 94%  94%   Weight:  97.5 kg  98 kg  Height:  5' 7"  (1.702 m)      Intake/Output Summary (Last 24 hours) at 02/18/2022 1140 Last data filed at 02/18/2022 0648 Gross per 24 hour  Intake 481.71 ml  Output --  Net 481.71 ml    Filed Weights   02/17/22 0500 02/17/22 2103 02/18/22 0500  Weight: 97.5 kg 97.5 kg 98 kg    Physical Examination: Body mass index is 33.84 kg/m.   General: Obese built, not in obvious distress HENT: Mild pallor and icterus noted.  Oral mucosa is moist.  Chest:  Clear breath sounds.  Diminished breath sounds bilaterally. No crackles or wheezes.  CVS: S1 &S2 heard. No murmur.  Regular rate and rhythm. Abdomen: Soft, nontender, nondistended.  Bowel sounds are heard.   Extremities: No cyanosis, clubbing with bilateral pitting edema noted.  Peripheral pulses are palpable. Psych: Alert, awake  and Communicative, normal mood CNS:  No cranial nerve deficits.  Moves all extremities. Skin: Warm and dry.  No rashes noted.  Data Reviewed:   CBC: Recent Labs  Lab 02/16/22 1700 02/17/22 0141 02/18/22 0528  WBC 18.5* 16.3* 20.7*  NEUTROABS 15.6*  --   --   HGB 12.4* 11.4* 9.3*  HCT 36.9* 35.0* 28.7*  MCV 98.4 100.9* 102.9*  PLT 148* 135* 114*     Basic Metabolic Panel: Recent Labs  Lab 02/16/22 1700 02/17/22 0141 02/18/22 0528  NA 133* 133* 135  K 4.4 4.2 4.2  CL 101 104 110  CO2 21* 20* 18*  GLUCOSE 164* 101* 125*  BUN 85* 85* 107*  CREATININE 2.05* 2.22* 2.19*  CALCIUM 8.9 7.7* 7.7*  MG 2.8* 2.7* 2.7*     Liver Function Tests: Recent Labs  Lab 02/16/22 1700 02/17/22 0141 02/18/22 0528  AST 82* 77* 56*  ALT 40 43 32  ALKPHOS 156* 134* 112  BILITOT 8.0* 7.0* 5.6*  PROT 7.2 6.5 5.7*  ALBUMIN 2.6* 1.8* 1.6*      Radiology Studies: CT CHEST ABDOMEN PELVIS WO CONTRAST  Result Date: 02/16/2022 CLINICAL DATA:  Right chest pain and severe low back pain. Generalized weakness. EXAM: CT CHEST, ABDOMEN AND PELVIS WITHOUT CONTRAST TECHNIQUE: Multidetector CT imaging of the chest, abdomen and pelvis was performed following the standard protocol without IV contrast. RADIATION DOSE REDUCTION: This exam was performed according to the departmental dose-optimization program which includes automated exposure control, adjustment of the mA and/or kV according to patient size and/or use of iterative reconstruction technique. COMPARISON:  Pelvic CT dated 05/25/2011 and CT abdomen pelvis dated 04/16/2011. FINDINGS: Evaluation of this exam is limited in the absence of intravenous contrast as well as due to respiratory motion. CT CHEST FINDINGS Cardiovascular: There is no cardiomegaly or pericardial effusion. There is coronary vascular calcification. Mild atherosclerotic calcification of the thoracic aorta. No aneurysmal dilatation. The central pulmonary arteries are grossly  unremarkable. Mediastinum/Nodes: No hilar or mediastinal adenopathy. The esophagus is grossly unremarkable. Mediastinal fluid collection. Lungs/Pleura: Small bilateral pleural effusions with minimal partial compressive atelectasis of the lower lobes. Pneumonia is not excluded. Clinical correlation is recommended. No pneumothorax. The central airways are patent. Musculoskeletal: Mild degenerative changes of the spine. Partially visualized left humeral head fixation screws. No acute osseous pathology. CT ABDOMEN PELVIS FINDINGS No intra-abdominal free air.  Small ascites. Hepatobiliary: Cirrhosis. Indeterminate hepatic hypodense lesions measuring up to 2 cm in the caudate lobe. These can be better characterized with MRI without and with contrast on a nonemergent/outpatient basis. No biliary ductal dilatation. Gallstones. No pericholecystic fluid or evidence of acute cholecystitis by CT. Pancreas: The pancreas is grossly unremarkable. Stranding of the peripancreatic fat, likely related to ascites. Correlation with pancreatic enzymes recommended if there is clinical concern for acute pancreatitis. Spleen: The spleen is enlarged measuring approximately 7 cm in length. There is a large area of hypodensity in the mid to upper pole of the spleen most consistent with infarct. Adrenals/Urinary Tract: The adrenal glands are unremarkable. The kidneys, visualized ureters, and urinary bladder appear unremarkable. Stomach/Bowel: There is sigmoid diverticulosis. There is no bowel obstruction. The appendix is normal. Vascular/Lymphatic: Mild aortoiliac atherosclerotic disease. The IVC is unremarkable. No portal venous gas. There is no adenopathy. Reproductive: The prostate and seminal vesicles are grossly unremarkable. No pelvic mass. Other: Diffuse mesenteric edema. Mild engorgement of the mesenteric vessels. There is enlargement of the umbilical vein. Musculoskeletal: Degenerative changes of the spine. No acute osseous pathology.  IMPRESSION: 1. Small bilateral pleural effusions with minimal partial compressive atelectasis of the lower lobes. 2. Cirrhosis with portal hypertension, splenomegaly, and small ascites. 3. Moderate sized splenic infarct. 4. Cholelithiasis. 5. Sigmoid diverticulosis. No bowel obstruction. Normal appendix. 6.  Aortic Atherosclerosis (ICD10-I70.0). Electronically Signed   By: Anner Crete M.D.   On: 02/16/2022 19:06   CT Head Wo Contrast  Result Date: 02/16/2022 CLINICAL DATA:  Recent fall EXAM: CT HEAD WITHOUT CONTRAST TECHNIQUE: Contiguous axial images were obtained from the base of the skull through the vertex without intravenous contrast. RADIATION DOSE REDUCTION: This exam was performed according to the departmental dose-optimization program which includes automated exposure control, adjustment of the mA and/or kV according to patient size and/or use of iterative reconstruction  technique. COMPARISON:  None Available. FINDINGS: Brain: No acute intracranial findings are seen. There are no signs of bleeding within the cranium. Cortical sulci are prominent. There is 4 mm calcification in the falx in the image 36 of series 4, possibly dystrophic dural calcification. Less likely possibility would be a tiny meningioma. There is no adjacent edema or mass effect. Vascular: Unremarkable. Skull: No fracture is seen. Sinuses/Orbits: There is mucosal thickening in maxillary sinuses, more so on the left side. Other: None. IMPRESSION: No acute intracranial findings are seen in noncontrast CT brain. Atrophy. There is 4 mm calcification in falx adjacent to corpus callosum, possibly dystrophic dural calcification. Chronic maxillary sinusitis. Electronically Signed   By: Elmer Picker M.D.   On: 02/16/2022 18:33   DG Knee 2 Views Left  Result Date: 02/16/2022 CLINICAL DATA:  Fall, pain. EXAM: LEFT KNEE - 1-2 VIEW COMPARISON:  None Available. FINDINGS: Status post left knee total arthroplasty. The hardware is  intact. No perihardware loosening. No acute fracture. IMPRESSION: Status post left knee total arthroplasty. No acute fracture. Electronically Signed   By: Keane Police D.O.   On: 02/16/2022 17:12      LOS: 1 day    Flora Lipps, MD [facility] Available via Epic secure chat 7am-7pm After these hours, please refer to coverage provider listed on [email or web address] 02/18/2022, 11:40 AM

## 2022-02-18 NOTE — Consult Note (Signed)
[facility]       Reason for Consult: bacteremia    Referring Physician: Dr. Louanne Belton  Principal Problem:   Splenic infarction Active Problems:   Alcohol abuse, daily use   HTN (hypertension)   Hyperlipidemia   Elevated LFTs   GERD (gastroesophageal reflux disease)   Cirrhosis (HCC)   Leukocytosis   AKI (acute kidney injury) ([place])   Splenic infarct   Thrombocytopenia (HCC)    folic acid  1 mg Oral Daily   lactulose  20 g Oral BID   lidocaine  1 patch Transdermal Q24H   metoprolol succinate  50 mg Oral Daily   multivitamin with minerals  1 tablet Oral Daily   pantoprazole (PROTONIX) IV  40 mg Intravenous Q12H   sodium chloride flush  3 mL Intravenous Q12H   thiamine  100 mg Oral Daily   Or   thiamine  100 mg Intravenous Daily    Recommendations: MRI spine for ? Epidural abscess TTE, TEE if indicated Repeat blood cultures  Assessment: He has Staph lugundensis in blood cultures associated with new low back pain with concern for lumbar infection.  I have ordered an MRI for this.  I do recommend evaluation with TTE and TEE if indicated as well.   Antibiotics: Day 3 antibiotics  HPI: Marc Ponce is a 66 y.o. male with a history of steatohepatitis, chronic alcohol use, HTN came in with ongoing worsening low back pain, weakness, radiculopathy and incontinence.  Wife is at the bedside and gives a history of several weeks of progressive symptoms including more and more difficulty standing and getting out of bed.  Mainly lower extremities.  + leukocytosis.  He has had some confusion as well.  Now diagnosed with decompensated cirrhosis.  Hepatitis A, B and C negative.     Review of Systems:  Constitutional: negative for fevers and chills All other systems reviewed and are negative    Past Medical History:  Diagnosis Date   Allergy    Arthritis    Diverticulitis    GERD (gastroesophageal reflux disease)    History of chicken pox     Hypertension    Peri-rectal abscess    Rectal pain     Social History   Tobacco Use   Smoking status: Former    Types: Cigarettes    Quit date: 04/01/1986    Years since quitting: 35.9   Smokeless tobacco: Never  Vaping Use   Vaping Use: Never used  Substance Use Topics   Alcohol use: Yes    Comment: 3-6 per day   Drug use: No    Family History  Problem Relation Age of Onset   Dementia Mother    Arthritis Mother    Heart disease Father 69       MI    Hypertension Father    Hyperlipidemia Father    COPD Father    Diabetes Father    Hearing loss Father    Heart attack Father    Kidney disease Father    Heart disease Maternal Grandmother    Arthritis Maternal Grandmother    Heart disease Maternal Grandfather    Lung cancer Maternal Grandfather    Heart disease Paternal Grandmother    Stroke Paternal Grandmother    Leukemia Paternal Grandfather    Colon cancer Neg Hx    Colon polyps Neg Hx    Stomach cancer Neg Hx    Esophageal cancer Neg Hx  No Known Allergies  Physical Exam: Constitutional: in no apparent distress  Vitals:   02/17/22 2103 02/18/22 0423  BP: 133/66 (!) 105/51  Pulse: 73 91  Resp: 20 (!) 22  Temp: 97.6 F (36.4 C) 98.6 F (37 C)  SpO2:  94%   EYES: anicteric Respiratory: normal respiratory effort Musculoskeletal: no edema Skin: no rash  Lab Results  Component Value Date   WBC 20.7 (H) 02/18/2022   HGB 9.3 (L) 02/18/2022   HCT 28.7 (L) 02/18/2022   MCV 102.9 (H) 02/18/2022   PLT 114 (L) 02/18/2022    Lab Results  Component Value Date   CREATININE 2.19 (H) 02/18/2022   BUN 107 (H) 02/18/2022   NA 135 02/18/2022   K 4.2 02/18/2022   CL 110 02/18/2022   CO2 18 (L) 02/18/2022    Lab Results  Component Value Date   ALT 32 02/18/2022   AST 56 (H) 02/18/2022   ALKPHOS 112 02/18/2022     Microbiology: Recent Results (from the past 240 hour(s))  Blood culture (routine x 2)     Status: None (Preliminary result)    Collection Time: 02/16/22  5:39 PM   Specimen: BLOOD  Result Value Ref Range Status   Specimen Description   Final    BLOOD RIGHT ANTECUBITAL Performed at [facility], [address], [place], [place] [address]    Special Requests   Final    Blood Culture adequate volume BOTTLES DRAWN AEROBIC ONLY Performed at [facility], [address], [place], [place] [address]    Culture  Setup Time   Final    GRAM POSITIVE COCCI IN CLUSTERS AEROBIC BOTTLE ONLY    Culture   Final    GRAM POSITIVE COCCI IDENTIFICATION TO FOLLOW Performed at [facility], [place] [address], [place], [place] [address]    Report Status PENDING  Incomplete  Blood culture (routine x 2)     Status: Abnormal (Preliminary result)   Collection Time: 02/16/22  5:39 PM   Specimen: BLOOD  Result Value Ref Range Status   Specimen Description   Final    BLOOD LEFT ANTECUBITAL Performed at [facility], [address] [place], [place] [address]    Special Requests   Final    Blood Culture adequate volume BOTTLES DRAWN AEROBIC AND ANAEROBIC Performed at [facility], [place], [place] [address]    Culture  Setup Time   Final    GRAM POSITIVE COCCI IN CLUSTERS ANAEROBIC BOTTLE ONLY CRITICAL RESULT CALLED TO, READ BACK BY AND VERIFIED WITH: PHARMD MICHELLE BELL ON 02/17/22 @ 1606 BY DRT    Culture (A)  Final    STAPHYLOCOCCUS LUGDUNENSIS SUSCEPTIBILITIES TO FOLLOW Performed at [facility], [place] [address], [place], [place] [address]    Report Status PENDING  Incomplete  Blood Culture ID Panel (Reflexed)     Status: Abnormal   Collection Time: 02/16/22  5:39 PM  Result Value Ref Range Status   Enterococcus faecalis NOT DETECTED NOT DETECTED Final   Enterococcus Faecium NOT DETECTED NOT DETECTED Final   Listeria monocytogenes NOT DETECTED NOT DETECTED Final   Staphylococcus species DETECTED (A) NOT DETECTED  Final    Comment: CRITICAL RESULT CALLED TO, READ BACK BY AND VERIFIED WITH: PHARMD MICHELLE BELL ON 02/17/22 @ 1606 BY DRT    Staphylococcus aureus (BCID) NOT DETECTED NOT DETECTED Final   Staphylococcus epidermidis NOT DETECTED NOT DETECTED Final   Staphylococcus lugdunensis DETECTED (  A) NOT DETECTED Final    Comment: CRITICAL RESULT CALLED TO, READ BACK BY AND VERIFIED WITH: PHARMD MICHELLE BELL ON 02/17/22 @ 1606 BY DRT    Streptococcus species NOT DETECTED NOT DETECTED Final   Streptococcus agalactiae NOT DETECTED NOT DETECTED Final   Streptococcus pneumoniae NOT DETECTED NOT DETECTED Final   Streptococcus pyogenes NOT DETECTED NOT DETECTED Final   A.calcoaceticus-baumannii NOT DETECTED NOT DETECTED Final   Bacteroides fragilis NOT DETECTED NOT DETECTED Final   Enterobacterales NOT DETECTED NOT DETECTED Final   Enterobacter cloacae complex NOT DETECTED NOT DETECTED Final   Escherichia coli NOT DETECTED NOT DETECTED Final   Klebsiella aerogenes NOT DETECTED NOT DETECTED Final   Klebsiella oxytoca NOT DETECTED NOT DETECTED Final   Klebsiella pneumoniae NOT DETECTED NOT DETECTED Final   Proteus species NOT DETECTED NOT DETECTED Final   Salmonella species NOT DETECTED NOT DETECTED Final   Serratia marcescens NOT DETECTED NOT DETECTED Final   Haemophilus influenzae NOT DETECTED NOT DETECTED Final   Neisseria meningitidis NOT DETECTED NOT DETECTED Final   Pseudomonas aeruginosa NOT DETECTED NOT DETECTED Final   Stenotrophomonas maltophilia NOT DETECTED NOT DETECTED Final   Candida albicans NOT DETECTED NOT DETECTED Final   Candida auris NOT DETECTED NOT DETECTED Final   Candida glabrata NOT DETECTED NOT DETECTED Final   Candida krusei NOT DETECTED NOT DETECTED Final   Candida parapsilosis NOT DETECTED NOT DETECTED Final   Candida tropicalis NOT DETECTED NOT DETECTED Final   Cryptococcus neoformans/gattii NOT DETECTED NOT DETECTED Final   Methicillin resistance mecA/C NOT DETECTED  NOT DETECTED Final    Comment: Performed at [facility], [address], [place], [place] [address]    Anjeanette Petzold W Ferd Horrigan, [facility] [email or web address] 02/18/2022, 12:30 PM

## 2022-02-18 NOTE — Progress Notes (Addendum)
Attending physician's note   I have taken a history, reviewed the chart, and examined the patient. I performed a substantive portion of this encounter, including complete performance of at least one of the key components, in conjunction with the APP. I agree with the APP's note, impression, and recommendations with my edits.   Spouse present at bedside today.  More confusion today.  Has had some dark stools reported by hospital staff, but patient cannot recall.  Hemoglobin 9.3, down from 11.4 yesterday.  WBC 20.7, up from 16 yesterday.  Not much change in renal function with creatinine 2.2 (same as yesterday).  - Continue serial CBC checks with blood products as needed per protocol - Clears okay today with n.p.o. at midnight - Agree with high-dose IV PPI - EGD tomorrow for diagnostic and therapeutic intent - Heparin GTT stopped.  Recommend formal Hematology input on whether or not anticoagulation is needed for splenic infarct of unknown duration - Continue IV albumin and reassess renal function tomorrow.  If no improvement, plan for midodrine, octreotide, and Nephrology consult for consideration of HRS - Holding ARB - Will eventually need MRI three-phase liver without and with contrast - Per ID service, plan for MRI L-spine and TTE +/- TEE due to staph infection and low back pain with concern for lumbar infection - CIWA  The indications, risks, and benefits of EGD were explained to the patient and family member in detail. Risks include but are not limited to bleeding, perforation, adverse reaction to medications, and cardiopulmonary compromise. Sequelae include but are not limited to the possibility of surgery, hospitalization, and mortality. The patient verbalized understanding and wished to proceed.    Dargan, DO, FACG [phone] office          Progress Note   Subjective  Day #2 Chief Complaint: Cirrhosis, abdominal pain, GI bleed  Today, the patient tells me  that much not much has changed.  His wife is in the room today tells me she feels like he is not as clearheaded as he was yesterday and seems more confused.  He tells me he generally just "feels worse".  Tells me he had a bowel movement this morning but did not tell me that it was some bright red blood and black tarry stool (reported by nursing later).  Denies any other new complaints or concerns, continues with back pain.   Objective   Vital signs in last 24 hours: Temp:  [97.6 F (36.4 C)-98.6 F (37 C)] 98.6 F (37 C) (10/20 0423) Pulse Rate:  [70-91] 91 (10/20 0423) Resp:  [16-22] 22 (10/20 0423) BP: (105-133)/(47-66) 105/51 (10/20 0423) SpO2:  [94 %-97 %] 94 % (10/20 0423) Weight:  [97.5 kg-98 kg] 98 kg (10/20 0500) Last BM Date : 02/17/22 General:   Chronically ill-appearing Caucasian male in NAD Heart:  Regular rate and rhythm; no murmurs Lungs: Respirations even and unlabored, lungs CTA bilaterally Abdomen:  Soft, nontender and nondistended. Normal bowel sounds. Psych:  Cooperative. Normal mood and affect.  Intake/Output from previous day: 10/19 0701 - 10/20 0700 In: 1106.8 [I.V.:956.8; IV Piggyback:150] Out: -    Lab Results: Recent Labs    02/16/22 1700 02/17/22 0141 02/18/22 0528  WBC 18.5* 16.3* 20.7*  HGB 12.4* 11.4* 9.3*  HCT 36.9* 35.0* 28.7*  PLT 148* 135* 114*   BMET Recent Labs    02/16/22 1700 02/17/22 0141 02/18/22 0528  NA 133* 133* 135  K 4.4 4.2 4.2  CL 101 104 110  CO2 21* 20* 18*  GLUCOSE 164* 101* 125*  BUN 85* 85* 107*  CREATININE 2.05* 2.22* 2.19*  CALCIUM 8.9 7.7* 7.7*   LFT Recent Labs    02/18/22 0528  PROT 5.7*  ALBUMIN 1.6*  AST 56*  ALT 32  ALKPHOS 112  BILITOT 5.6*   PT/INR Recent Labs    02/17/22 0141 02/18/22 0528  LABPROT 23.3* 24.9*  INR 2.1* 2.3*    Studies/Results: CT CHEST ABDOMEN PELVIS WO CONTRAST  Result Date: 02/16/2022 CLINICAL DATA:  Right chest pain and severe low back pain. Generalized  weakness. EXAM: CT CHEST, ABDOMEN AND PELVIS WITHOUT CONTRAST TECHNIQUE: Multidetector CT imaging of the chest, abdomen and pelvis was performed following the standard protocol without IV contrast. RADIATION DOSE REDUCTION: This exam was performed according to the departmental dose-optimization program which includes automated exposure control, adjustment of the mA and/or kV according to patient size and/or use of iterative reconstruction technique. COMPARISON:  Pelvic CT dated 05/25/2011 and CT abdomen pelvis dated 04/16/2011. FINDINGS: Evaluation of this exam is limited in the absence of intravenous contrast as well as due to respiratory motion. CT CHEST FINDINGS Cardiovascular: There is no cardiomegaly or pericardial effusion. There is coronary vascular calcification. Mild atherosclerotic calcification of the thoracic aorta. No aneurysmal dilatation. The central pulmonary arteries are grossly unremarkable. Mediastinum/Nodes: No hilar or mediastinal adenopathy. The esophagus is grossly unremarkable. Mediastinal fluid collection. Lungs/Pleura: Small bilateral pleural effusions with minimal partial compressive atelectasis of the lower lobes. Pneumonia is not excluded. Clinical correlation is recommended. No pneumothorax. The central airways are patent. Musculoskeletal: Mild degenerative changes of the spine. Partially visualized left humeral head fixation screws. No acute osseous pathology. CT ABDOMEN PELVIS FINDINGS No intra-abdominal free air.  Small ascites. Hepatobiliary: Cirrhosis. Indeterminate hepatic hypodense lesions measuring up to 2 cm in the caudate lobe. These can be better characterized with MRI without and with contrast on a nonemergent/outpatient basis. No biliary ductal dilatation. Gallstones. No pericholecystic fluid or evidence of acute cholecystitis by CT. Pancreas: The pancreas is grossly unremarkable. Stranding of the peripancreatic fat, likely related to ascites. Correlation with pancreatic  enzymes recommended if there is clinical concern for acute pancreatitis. Spleen: The spleen is enlarged measuring approximately 7 cm in length. There is a large area of hypodensity in the mid to upper pole of the spleen most consistent with infarct. Adrenals/Urinary Tract: The adrenal glands are unremarkable. The kidneys, visualized ureters, and urinary bladder appear unremarkable. Stomach/Bowel: There is sigmoid diverticulosis. There is no bowel obstruction. The appendix is normal. Vascular/Lymphatic: Mild aortoiliac atherosclerotic disease. The IVC is unremarkable. No portal venous gas. There is no adenopathy. Reproductive: The prostate and seminal vesicles are grossly unremarkable. No pelvic mass. Other: Diffuse mesenteric edema. Mild engorgement of the mesenteric vessels. There is enlargement of the umbilical vein. Musculoskeletal: Degenerative changes of the spine. No acute osseous pathology. IMPRESSION: 1. Small bilateral pleural effusions with minimal partial compressive atelectasis of the lower lobes. 2. Cirrhosis with portal hypertension, splenomegaly, and small ascites. 3. Moderate sized splenic infarct. 4. Cholelithiasis. 5. Sigmoid diverticulosis. No bowel obstruction. Normal appendix. 6.  Aortic Atherosclerosis (ICD10-I70.0). Electronically Signed   By: Anner Crete M.D.   On: 02/16/2022 19:06   CT Head Wo Contrast  Result Date: 02/16/2022 CLINICAL DATA:  Recent fall EXAM: CT HEAD WITHOUT CONTRAST TECHNIQUE: Contiguous axial images were obtained from the base of the skull through the vertex without intravenous contrast. RADIATION DOSE REDUCTION: This exam was performed according to the departmental dose-optimization program which includes  automated exposure control, adjustment of the mA and/or kV according to patient size and/or use of iterative reconstruction technique. COMPARISON:  None Available. FINDINGS: Brain: No acute intracranial findings are seen. There are no signs of bleeding within  the cranium. Cortical sulci are prominent. There is 4 mm calcification in the falx in the image 36 of series 4, possibly dystrophic dural calcification. Less likely possibility would be a tiny meningioma. There is no adjacent edema or mass effect. Vascular: Unremarkable. Skull: No fracture is seen. Sinuses/Orbits: There is mucosal thickening in maxillary sinuses, more so on the left side. Other: None. IMPRESSION: No acute intracranial findings are seen in noncontrast CT brain. Atrophy. There is 4 mm calcification in falx adjacent to corpus callosum, possibly dystrophic dural calcification. Chronic maxillary sinusitis. Electronically Signed   By: Elmer Picker M.D.   On: 02/16/2022 18:33   DG Knee 2 Views Left  Result Date: 02/16/2022 CLINICAL DATA:  Fall, pain. EXAM: LEFT KNEE - 1-2 VIEW COMPARISON:  None Available. FINDINGS: Status post left knee total arthroplasty. The hardware is intact. No perihardware loosening. No acute fracture. IMPRESSION: Status post left knee total arthroplasty. No acute fracture. Electronically Signed   By: Keane Police D.O.   On: 02/16/2022 17:12      Assessment / Plan:   Assessment: 1.  Decompensated alcoholic cirrhosis with portal hypertension: MELD-NA 29, 40+ years of alcohol history, CT with cirrhosis with thrombocytopenia and signs of portal hypertension on imaging, now with GI bleed; can Zurn for varices versus other 2.  Splenic infarct: 3.  AKI: Could represent HRS, started on Albumin yesterday, will get another dose this afternoon, if not responding would recommend nephrology consultation 4.  Leukocytosis 5.  Alcohol abuse 6.  Musculoskeletal pain 7.  GERD 8.  GI bleed: Started this morning 02/18/2022 report of melena/bright red blood in the stool; concern for upper GI source including possibly varices  Plan: 1.  Given new onset of melena hematochezia today and a 2 g drop in hemoglobin we will plan on EGD tomorrow for further evaluation. 2.  Agree with  changing Pantoprazole to IV twice daily 3.  Discontinue heparin for now 4.  Continue to monitor hemoglobin every 6-8 hours with transfusion as needed less than 7 5.  Continue other supportive medications 6.  Continue to monitor PT/INR, CBC and CMP 7.  Again holding on diuretics given AKI/HRS, patient started on Albumin yesterday, second dose this afternoon, if not responding would recommend consulting nephrology tomorrow morning 8.  Will eventually need MRI 3-phase liver without and with contrast for [facility] screening and evaluation of indeterminate 2 cm hypodense lesion in the caudate lobe 9.  CIWA protocol 10.  Agree with consulting hematology in regards to splenic infarct and need for anticoagulation, obviously on hold at the moment given GI bleed 11.  Continue antibiotics  Thank you for your kind consultation, we will continue to follow    LOS: 1 day   Levin Erp  02/18/2022, 10:35 AM

## 2022-02-19 ENCOUNTER — Encounter (HOSPITAL_COMMUNITY): Payer: Self-pay | Admitting: Internal Medicine

## 2022-02-19 ENCOUNTER — Encounter (HOSPITAL_COMMUNITY)
Admission: EM | Disposition: A | Discharge status: Home Health | Payer: Self-pay | Source: Home / Self Care | Attending: Internal Medicine

## 2022-02-19 ENCOUNTER — Inpatient Hospital Stay (HOSPITAL_COMMUNITY): Payer: Medicare Other | Admitting: Certified Registered Nurse Anesthetist

## 2022-02-19 ENCOUNTER — Inpatient Hospital Stay (HOSPITAL_COMMUNITY): Payer: Medicare Other

## 2022-02-19 DIAGNOSIS — K7030 Alcoholic cirrhosis of liver without ascites: Secondary | ICD-10-CM | POA: Diagnosis not present

## 2022-02-19 DIAGNOSIS — I8510 Secondary esophageal varices without bleeding: Secondary | ICD-10-CM

## 2022-02-19 DIAGNOSIS — D62 Acute posthemorrhagic anemia: Secondary | ICD-10-CM

## 2022-02-19 DIAGNOSIS — K2210 Ulcer of esophagus without bleeding: Secondary | ICD-10-CM | POA: Diagnosis not present

## 2022-02-19 DIAGNOSIS — R7881 Bacteremia: Secondary | ICD-10-CM

## 2022-02-19 DIAGNOSIS — K3189 Other diseases of stomach and duodenum: Secondary | ICD-10-CM | POA: Diagnosis not present

## 2022-02-19 DIAGNOSIS — K921 Melena: Secondary | ICD-10-CM

## 2022-02-19 DIAGNOSIS — Z87891 Personal history of nicotine dependence: Secondary | ICD-10-CM

## 2022-02-19 DIAGNOSIS — K7682 Hepatic encephalopathy: Secondary | ICD-10-CM

## 2022-02-19 DIAGNOSIS — K766 Portal hypertension: Secondary | ICD-10-CM | POA: Diagnosis not present

## 2022-02-19 DIAGNOSIS — I8511 Secondary esophageal varices with bleeding: Secondary | ICD-10-CM | POA: Diagnosis not present

## 2022-02-19 DIAGNOSIS — D696 Thrombocytopenia, unspecified: Secondary | ICD-10-CM

## 2022-02-19 DIAGNOSIS — I10 Essential (primary) hypertension: Secondary | ICD-10-CM

## 2022-02-19 DIAGNOSIS — D735 Infarction of spleen: Secondary | ICD-10-CM | POA: Diagnosis not present

## 2022-02-19 HISTORY — PX: BIOPSY: SHX5522

## 2022-02-19 HISTORY — PX: ESOPHAGOGASTRODUODENOSCOPY (EGD) WITH PROPOFOL: SHX5813

## 2022-02-19 LAB — ECHOCARDIOGRAM COMPLETE
AR max vel: 2.07 cm2
AV Area VTI: 2.26 cm2
AV Area mean vel: 2.02 cm2
AV Mean grad: 7 mmHg
AV Peak grad: 12.1 mmHg
Ao pk vel: 1.74 m/s
Area-P 1/2: 3.31 cm2
Calc EF: 61.9 %
Height: 67 in
S' Lateral: 2.3 cm
Single Plane A2C EF: 63.8 %
Single Plane A4C EF: 62.1 %
Weight: 3513.25 oz

## 2022-02-19 LAB — CULTURE, BLOOD (ROUTINE X 2)
Special Requests: ADEQUATE
Special Requests: ADEQUATE

## 2022-02-19 LAB — CBC
HCT: 26.4 % — ABNORMAL LOW (ref 39.0–52.0)
Hemoglobin: 8.7 g/dL — ABNORMAL LOW (ref 13.0–17.0)
MCH: 33 pg (ref 26.0–34.0)
MCHC: 33 g/dL (ref 30.0–36.0)
MCV: 100 fL (ref 80.0–100.0)
Platelets: 117 10*3/uL — ABNORMAL LOW (ref 150–400)
RBC: 2.64 MIL/uL — ABNORMAL LOW (ref 4.22–5.81)
RDW: 18 % — ABNORMAL HIGH (ref 11.5–15.5)
WBC: 19.5 10*3/uL — ABNORMAL HIGH (ref 4.0–10.5)
nRBC: 0 % (ref 0.0–0.2)

## 2022-02-19 LAB — BASIC METABOLIC PANEL
Anion gap: 7 (ref 5–15)
BUN: 91 mg/dL — ABNORMAL HIGH (ref 8–23)
CO2: 18 mmol/L — ABNORMAL LOW (ref 22–32)
Calcium: 8 mg/dL — ABNORMAL LOW (ref 8.9–10.3)
Chloride: 112 mmol/L — ABNORMAL HIGH (ref 98–111)
Creatinine, Ser: 1.83 mg/dL — ABNORMAL HIGH (ref 0.61–1.24)
GFR, Estimated: 40 mL/min — ABNORMAL LOW (ref >60)
Glucose, Bld: 123 mg/dL — ABNORMAL HIGH (ref 70–99)
Potassium: 3.9 mmol/L (ref 3.5–5.1)
Sodium: 137 mmol/L (ref 135–145)

## 2022-02-19 LAB — HEMOGLOBIN AND HEMATOCRIT, BLOOD
HCT: 30.6 % — ABNORMAL LOW (ref 39.0–52.0)
HCT: 31.4 % — ABNORMAL LOW (ref 39.0–52.0)
HCT: 35.4 % — ABNORMAL LOW (ref 39.0–52.0)
Hemoglobin: 9.6 g/dL — ABNORMAL LOW (ref 13.0–17.0)
Hemoglobin: 10.4 g/dL — ABNORMAL LOW (ref 13.0–17.0)
Hemoglobin: 11.3 g/dL — ABNORMAL LOW (ref 13.0–17.0)

## 2022-02-19 LAB — MAGNESIUM: Magnesium: 3 mg/dL — ABNORMAL HIGH (ref 1.7–2.4)

## 2022-02-19 SURGERY — ESOPHAGOGASTRODUODENOSCOPY (EGD) WITH PROPOFOL
Anesthesia: General

## 2022-02-19 MED ORDER — PROPOFOL 10 MG/ML IV BOLUS
INTRAVENOUS | Status: AC
  Filled 2022-02-19: qty 20

## 2022-02-19 MED ORDER — SODIUM CHLORIDE 0.9 % IV SOLN: INTRAVENOUS | Status: DC

## 2022-02-19 MED ORDER — PROPOFOL 500 MG/50ML IV EMUL
INTRAVENOUS | Status: AC
  Filled 2022-02-19: qty 50

## 2022-02-19 MED ORDER — GADOBUTROL 1 MMOL/ML IV SOLN
10.0000 mL | Freq: Once | INTRAVENOUS | Status: AC | PRN
  Administered 2022-02-19: 10 mL via INTRAVENOUS

## 2022-02-19 MED ORDER — ALBUMIN HUMAN 25 % IV SOLN
100.0000 g | INTRAVENOUS | Status: AC
  Administered 2022-02-19 – 2022-02-20 (×2): 100 g via INTRAVENOUS
  Filled 2022-02-19 (×2): qty 400

## 2022-02-19 MED ORDER — RIFAXIMIN 550 MG PO TABS
550.0000 mg | ORAL_TABLET | Freq: Two times a day (BID) | ORAL | Status: DC
  Administered 2022-02-19 – 2022-02-20 (×4): 550 mg via ORAL
  Filled 2022-02-19 (×5): qty 1

## 2022-02-19 MED ORDER — PROPOFOL 10 MG/ML IV BOLUS
INTRAVENOUS | Status: DC | PRN
  Administered 2022-02-19 (×2): 10 mg via INTRAVENOUS
  Administered 2022-02-19 (×3): 20 mg via INTRAVENOUS

## 2022-02-19 MED ORDER — LIDOCAINE HCL (CARDIAC) PF 100 MG/5ML IV SOSY
PREFILLED_SYRINGE | INTRAVENOUS | Status: DC | PRN
  Administered 2022-02-19: 100 mg via INTRAVENOUS

## 2022-02-19 MED ORDER — ONDANSETRON HCL 4 MG/2ML IJ SOLN
INTRAMUSCULAR | Status: DC | PRN
  Administered 2022-02-19: 4 mg via INTRAVENOUS

## 2022-02-19 SURGICAL SUPPLY — 15 items

## 2022-02-19 NOTE — Anesthesia Preprocedure Evaluation (Signed)
Anesthesia Evaluation  Patient identified by MRN, date of birth, ID band Patient awake    Reviewed: Allergy & Precautions, NPO status , Patient's Chart, lab work & pertinent test results  Airway Mallampati: II  TM Distance: >3 FB Neck ROM: Full    Dental no notable dental hx.    Pulmonary neg pulmonary ROS, former smoker,    Pulmonary exam normal breath sounds clear to auscultation       Cardiovascular hypertension, Pt. on medications negative cardio ROS Normal cardiovascular exam Rhythm:Regular Rate:Normal     Neuro/Psych negative neurological ROS  negative psych ROS   GI/Hepatic GERD  ,(+)     substance abuse  alcohol use, Hepatitis -, C  Endo/Other  negative endocrine ROS  Renal/GU Renal InsufficiencyRenal disease  negative genitourinary   Musculoskeletal  (+) Arthritis , Osteoarthritis,    Abdominal (+) + obese,   Peds negative pediatric ROS (+)  Hematology  (+) Blood dyscrasia, anemia ,   Anesthesia Other Findings   Reproductive/Obstetrics negative OB ROS                             Anesthesia Physical Anesthesia Plan  ASA: 3  Anesthesia Plan: General   Post-op Pain Management: Minimal or no pain anticipated   Induction: Intravenous  PONV Risk Score and Plan: 2 and Ondansetron, Midazolam and Treatment may vary due to age or medical condition  Airway Management Planned: Nasal Cannula  Additional Equipment:   Intra-op Plan:   Post-operative Plan:   Informed Consent: I have reviewed the patients History and Physical, chart, labs and discussed the procedure including the risks, benefits and alternatives for the proposed anesthesia with the patient or authorized representative who has indicated his/her understanding and acceptance.     Dental advisory given  Plan Discussed with: CRNA  Anesthesia Plan Comments:         Anesthesia Quick Evaluation

## 2022-02-19 NOTE — Transfer of Care (Signed)
Immediate Anesthesia Transfer of Care Note  Patient: Marc Ponce  Procedure(s) Performed: ESOPHAGOGASTRODUODENOSCOPY (EGD) WITH PROPOFOL BIOPSY  Patient Location: PACU  Anesthesia Type:MAC  Level of Consciousness: drowsy  Airway & Oxygen Therapy: Patient Spontanous Breathing and Patient connected to face mask oxygen  Post-op Assessment: Report given to RN and Post -op Vital signs reviewed and stable  Post vital signs: Reviewed and stable  Last Vitals:  Vitals Value Taken Time  BP 117/52 02/19/22 0804  Temp 36.5 C 02/19/22 0804  Pulse 79 02/19/22 0805  Resp 29 02/19/22 0805  SpO2 100 % 02/19/22 0805  Vitals shown include unvalidated device data.  Last Pain:  Vitals:   02/19/22 0715  TempSrc: Temporal  PainSc: 0-No pain         Complications: No notable events documented.

## 2022-02-19 NOTE — Interval H&P Note (Signed)
History and Physical Interval Note:  Patient a little more altered this morning with limited responses to "yes/no".  No reported acute events overnight.  Hemoglobin 8.7 (down from 9.6 yesterday).  Plan for EGD today.  Consent obtained via phone from spouse.  Was also discussed at length with patient and her in room yesterday.  02/19/2022 7:39 AM  Marc Ponce  has presented today for surgery, with the diagnosis of Melena/Hematochezia, ABLA.  The various methods of treatment have been discussed with the patient and family. After consideration of risks, benefits and other options for treatment, the patient has consented to  Procedure(s): ESOPHAGOGASTRODUODENOSCOPY (EGD) WITH PROPOFOL (N/A) as a surgical intervention.  The patient's history has been reviewed, patient examined, no change in status, stable for surgery.  I have reviewed the patient's chart and labs.  Questions were answered to the patient's satisfaction.     Marc Ponce

## 2022-02-19 NOTE — Op Note (Signed)
[facility] Patient Name: Marc Ponce Procedure Date: 02/19/2022 MRN: [id number] Attending MD: Gerrit Heck , MD Date of Birth: 10-31-1955 CSN: [id number] Age: 66 Admit Type: Inpatient Procedure:                Upper GI endoscopy Indications:              Acute post hemorrhagic anemia, Melena Providers:                Gerrit Heck, MD, Doristine Johns, RN, Ladona Ridgel, Technician Referring MD:              Medicines:                Monitored Anesthesia Care Complications:            No immediate complications. Estimated Blood Loss:     Estimated blood loss was minimal. Procedure:                Pre-Anesthesia Assessment:                           - Prior to the procedure, a History and Physical                            was performed, and patient medications and                            allergies were reviewed. The patient's tolerance of                            previous anesthesia was also reviewed. The risks                            and benefits of the procedure and the sedation                            options and risks were discussed with the patient                            and spouse. All questions were answered, and                            informed consent was obtained. Prior                            Anticoagulants: The patient has taken no previous                            anticoagulant or antiplatelet agents. ASA Grade                            Assessment: III - A patient with severe systemic  disease. After reviewing the risks and benefits,                            the patient was deemed in satisfactory condition to                            undergo the procedure.                           After obtaining informed consent, the endoscope was                            passed under direct vision. Throughout the                            procedure, the patient's blood  pressure, pulse, and                            oxygen saturations were monitored continuously. The                            [id number]-[id number] ([id number] Olympus endoscope was introduced                            through the mouth, and advanced to the second part                            of duodenum. The upper GI endoscopy was                            accomplished without difficulty. The patient                            tolerated the procedure well. Scope In: Scope Out: Findings:      Grade I varices were found in the lower third of the esophagus. They       were small in size.      One superficial esophageal ulcer with no stigmata of recent bleeding was       found at the gastroesophageal junction. The lesion was 3 mm in largest       dimension.      Mild portal hypertensive gastropathy was found in the entire examined       stomach. There was mild contact oozing in the gastric body and fundus       without any discrete lesions requiring endoscopic intervention. Biopsies       were taken with a cold forceps for histology. Estimated blood loss was       minimal.      The examined duodenum was normal. Impression:               - Grade I esophageal varices.                           - Esophageal ulcer with no stigmata of recent  bleeding.                           - Portal hypertensive gastropathy. Biopsied.                           - Normal examined duodenum. Moderate Sedation:      Not Applicable - Patient had care per Anesthesia. Recommendation:           - Return patient to hospital ward for ongoing care.                           - Advance diet as tolerated.                           - Use Protonix (pantoprazole) 40 mg PO BID for 6                            weeks.                           - Use sucralfate suspension 1 gram PO QID for 4                            weeks.                           - Await pathology results.                           -  Hematology consult regarding need for                            anticoagulation of the incidentally noted splenic                            infarct. Based on clinical presentation and                            endoscopic findings, I suspect mucosal ooze in the                            setting of heparin gtt, and recommend holding off                            on restarting anticoagulation unless absolutely                            necessary.                           - Continue beta blocker (currently on Toprol XL)                            for primary variceal prophylaxis given Child Pugh C  cirrhosis and small varices.                           - Repeat EGD in 1 year for surveillance.                           - Start Rifaximin.                           - Resume Lactulose.                           - Resume antibiotics as prescribed.                           - Results discussed with wife by phone.                           - Discussed with primary inpatient team. Procedure Code(s):        --- Professional ---                           [phone], Esophagogastroduodenoscopy, flexible,                            transoral; with biopsy, single or multiple Diagnosis Code(s):        --- Professional ---                           I85.00, Esophageal varices without bleeding                           K22.10, Ulcer of esophagus without bleeding                           K76.6, Portal hypertension                           K31.89, Other diseases of stomach and duodenum                           D62, Acute posthemorrhagic anemia                           K92.1, Melena (includes Hematochezia) CPT copyright 2019 American Medical Association. All rights reserved. The codes documented in this report are preliminary and upon coder review may  be revised to meet current compliance requirements. Gerrit Heck, MD 02/19/2022 8:23:00 AM Number of Addenda: 0

## 2022-02-19 NOTE — Progress Notes (Signed)
  Echocardiogram 2D Echocardiogram has been performed.  Lana Fish 02/19/2022, 3:02 PM

## 2022-02-19 NOTE — Anesthesia Postprocedure Evaluation (Signed)
Anesthesia Post Note  Patient: Marc Ponce  Procedure(s) Performed: ESOPHAGOGASTRODUODENOSCOPY (EGD) WITH PROPOFOL BIOPSY     Patient location during evaluation: PACU Anesthesia Type: General Level of consciousness: awake and alert Pain management: pain level controlled Vital Signs Assessment: post-procedure vital signs reviewed and stable Respiratory status: spontaneous breathing, nonlabored ventilation and respiratory function stable Cardiovascular status: blood pressure returned to baseline and stable Postop Assessment: no apparent nausea or vomiting Anesthetic complications: no   No notable events documented.  Last Vitals:  Vitals:   02/19/22 0830 02/19/22 0847  BP: (!) 115/49 (!) 130/55  Pulse: 72 75  Resp: (!) 30 (!) 22  Temp:  36.4 C  SpO2: 92% 98%    Last Pain:  Vitals:   02/19/22 0847  TempSrc: Oral  PainSc:                  Lynda Rainwater

## 2022-02-19 NOTE — Progress Notes (Addendum)
PROGRESS NOTE    Marc Ponce  XBL:[id number] DOB: [birth year] DOA: 02/16/2022 PCP: [name], MD    Brief Narrative:  Marc Ponce is a 66 y.o. male with past medical history significant of steatohepatitis, chronic alcohol use, hypertension, hyperlipidemia, GERD, diverticulosis presented to the hospital with increasing low back pain with incontinence.  Patient did have a history of back pain/strain on February 11, 2022. Patient also complains of associated generalized fatigue and fall.  He does have a history of significant alcohol abuse and had to stop stopped drinking on October 11 due to his pain.  In the lab work showed hyponatremia with sodium of 133 creatinine was elevated at 2.0.  Glucose was 164, albumin 2.6, AST 82, alk phos 156, T. bili 8.  CBC showed leukocytosis at 18.5, hemoglobin stable at 12.4, platelets stable at 148.  TSH and T4 normal.  Lactic acid initially elevated to 3.2 but improved to 2.5 with IV fluids.  Ammonia was elevated at 86.  Urinalysis with trace hemoglobin only.   X-ray of the left knee showed no acute abnormality, CT head showed no acute abnormality.  CT of the chest abdomen pelvis showed small bilateral pleural effusions with atelectasis, cirrhosis with portal hypertension and splenomegaly with small ascites.  Also noted was moderate splenic infarct.   Magnesium level mildly elevated at 2.8.  Patient received cefepime, Flagyl, Toradol, morphine, lactulose, heparin, IV fluids, B1 in the ED and was considered for admission to the hospital for further evaluation and treatment.  During hospitalization, patient was seen by GI and underwent endoscopic evaluation on 02/19/2022 with oozing from  portal gastropathy..  Heparin drip was discontinued.  It has become more confused with some alcohol withdrawal as well.  ID has seen the patient due to bacteremia and recommendation is MRI of the thoracic and lumbar spine.  2D echocardiogram pending.  Assessment and  plan.  Principal Problem:   Splenic infarction Active Problems:   Alcohol abuse, daily use   HTN (hypertension)   Hyperlipidemia   Elevated LFTs   GERD (gastroesophageal reflux disease)   Cirrhosis (HCC)   Leukocytosis   AKI (acute kidney injury) ([place])   Splenic infarct   Thrombocytopenia (HCC)   Melena   Acute blood loss anemia   Secondary esophageal varices without bleeding (HCC)   Portal hypertensive gastropathy ([place])   Ulcer of esophagus without bleeding   Acute hepatic encephalopathy ([place])   Splenic infarct Noted to have splenic infarct, due to GI bleed.  Heparin drip was discontinued..  2D echocardiogram ordered to rule out vegetations still pending.  AKI  Likely secondary to NSAID and poor oral intake.  Could not rule out hepatorenal syndrome.  Received albumin.  We will give 2 additional doses today.  Creatinine 1 year ago was around 1.1-1.2.   Creatinine today at 1.8 from 2.1.  Received IV fluids.  Continue to hold  losartan-hydrochlorothiazide.  Continue to monitor renal function.   Alcoholic cirrhosis of liver with melena and bright red blood.   GI was consulted and patient had endoscopic evaluation done 02/19/2022 with findings of single column of esophageal varices, small esophageal ulcer which was clean-based, mild portal hypertensive gastropathy with contact oozing.  Continue Protonix IV twice daily.  Continue to hold IV heparin drip.  Continue lactulose.    Acute blood loss anemia secondary to GI bleed.  Hemodynamically stable.  No need for transfusion.  Continue with Protonix.  Status post EGD today.  Alcohol use with mild withdrawal.  Was a  heavy drinker.  Stopped drinking after February 09, 2022.  No withdrawal symptoms.  Continue CIWA protocol for now.  Continue thiamine, folic acid and multivitamin  Metabolic encephalopathy likely hepatic.  Continue lactulose.  Add rifaximin.  Rifaximin has been added to the regimen.  Leukocytosis, staph lugdunensis  bacteremia. Had recent use of steroids.  Urinalysis negative.  CT scan of abdomen pelvis without any acute source of infection.  Infectious disease on board due to bacteremia and recommend lumbar/thoracic MRI.  MRI pending at this time.  On IV cefazolin.  Repeat cultures have been added.  Musculoskeletal pain, back pain. MRI has been added to rule out spinal infection.  Continue Robaxin.    GERD Continue IV PPI   Hypertension Continue to hold losartan and HCTZ.  Continue metoprolol.     DVT prophylaxis: Place and maintain sequential compression device Start: 02/19/22 1325  Hold heparin drip, add SCD.   Code Status:     Code Status: Full Code  Disposition: Uncertain at this time.  Status is: Inpatient  The patient is inpatient because: GI bleed, cirrhosis of liver, splenic infarct, hepatic encephalopathy, status post endoscopy   Family Communication:  I had a prolonged discussion with the patient's spouse at bedside.  Spoke about potential poor prognosis in the context of compensated cirrhosis of liver, upper GI bleed, bacteremia with hepatic encephalopathy.  She understands the prognosis but hopeful about improvement.  Consultants:  GI Infectious disease. Oncology Dr. Lindi Adie   Procedures:  Upper GI endoscopy with biopsy  Antimicrobials:  None  Anti-infectives (From admission, onward)    Start     Dose/Rate Route Frequency Ordered Stop   02/19/22 1415  rifaximin (XIFAXAN) tablet 550 mg        550 mg Oral 2 times daily 02/19/22 1322     02/17/22 1800  ceFAZolin (ANCEF) IVPB 2g/100 mL premix        2 g 200 mL/hr over 30 Minutes Intravenous Every 8 hours 02/17/22 1607     02/16/22 1845  ceFEPIme (MAXIPIME) 2 g in sodium chloride 0.9 % 100 mL IVPB        2 g 200 mL/hr over 30 Minutes Intravenous  Once 02/16/22 1840 02/16/22 1947   02/16/22 1845  metroNIDAZOLE (FLAGYL) IVPB 500 mg        500 mg 100 mL/hr over 60 Minutes Intravenous  Once 02/16/22 1840 02/16/22 2102       Subjective:  Today, patient was seen and examined at bedside.  Appears to be more sleepy after the procedure.  Patient's wife at bedside.  Overall patient appears to be increasingly sick.  Objective: Vitals:   02/19/22 0804 02/19/22 0815 02/19/22 0830 02/19/22 0847  BP: (!) 117/52 (!) 104/48 (!) 115/49 (!) 130/55  Pulse: 76 71 72 75  Resp: 16 (!) 27 (!) 30 (!) 22  Temp: 97.7 F (36.5 C)   97.6 F (36.4 C)  TempSrc:    Oral  SpO2: 100% 96% 92% 98%  Weight:      Height:        Intake/Output Summary (Last 24 hours) at 02/19/2022 1330 Last data filed at 02/19/2022 0756 Gross per 24 hour  Intake 300 ml  Output 600 ml  Net -300 ml   Filed Weights   02/17/22 2103 02/18/22 0500 02/19/22 0500  Weight: 97.5 kg 98 kg 99.6 kg    Physical Examination: Body mass index is 34.39 kg/m.   General: Obese built, not in obvious distress, somnolent,  able to answer few questions, HENT:   Mild pallor noted.  Oral mucosa is moist.  Chest:  Clear breath sounds.   No crackles or wheezes.  CVS: S1 &S2 heard. No murmur.  Regular rate and rhythm. Abdomen: Soft, nontender, nondistended.  Bowel sounds are heard.   Extremities: No cyanosis, clubbing with bilateral pitting edema noted  Psych: Somnolent, CNS:  No cranial nerve deficits.  Moves all extremities. Skin: Warm and dry.  No rashes noted.  Data Reviewed:   CBC: Recent Labs  Lab 02/16/22 1700 02/17/22 0141 02/18/22 0528 02/18/22 1239 02/18/22 1554 02/19/22 0026 02/19/22 0606  WBC 18.5* 16.3* 20.7*  --   --   --  19.5*  NEUTROABS 15.6*  --   --   --   --   --   --   HGB 12.4* 11.4* 9.3* 9.4* 9.5* 9.6* 8.7*  HCT 36.9* 35.0* 28.7* 28.4* 29.7* 30.6* 26.4*  MCV 98.4 100.9* 102.9*  --   --   --  100.0  PLT 148* 135* 114*  --   --   --  117*    Basic Metabolic Panel: Recent Labs  Lab 02/16/22 1700 02/17/22 0141 02/18/22 0528 02/19/22 0606  NA 133* 133* 135 137  K 4.4 4.2 4.2 3.9  CL 101 104 110 112*  CO2 21* 20* 18* 18*   GLUCOSE 164* 101* 125* 123*  BUN 85* 85* 107* 91*  CREATININE 2.05* 2.22* 2.19* 1.83*  CALCIUM 8.9 7.7* 7.7* 8.0*  MG 2.8* 2.7* 2.7* 3.0*    Liver Function Tests: Recent Labs  Lab 02/16/22 1700 02/17/22 0141 02/18/22 0528  AST 82* 77* 56*  ALT 40 43 32  ALKPHOS 156* 134* 112  BILITOT 8.0* 7.0* 5.6*  PROT 7.2 6.5 5.7*  ALBUMIN 2.6* 1.8* 1.6*     Radiology Studies: MR Lumbar Spine W Wo Contrast  Result Date: 02/19/2022 CLINICAL DATA:  Thoracic and lumbar radiculopathy with infection suspected. Bacteremia EXAM: MRI THORACIC AND LUMBAR SPINE WITHOUT AND WITH CONTRAST TECHNIQUE: Multiplanar and multiecho pulse sequences of the thoracic and lumbar spine were obtained without and with intravenous contrast. CONTRAST:  34m GADAVIST GADOBUTROL 1 MMOL/ML IV SOLN COMPARISON:  None Available. FINDINGS: MRI THORACIC SPINE FINDINGS Alignment:  Exaggerated thoracic kyphosis.  No listhesis. Vertebrae: No fracture, evidence of discitis, or bone lesion. Cord:  Normal appearance when allowing for motion artifact. Paraspinal and other soft tissues: Trace pleural effusion and dependent atelectasis bilaterally. Disc levels: Midthoracic disc space narrowing and spondylitic spurring. No neural impingement MRI LUMBAR SPINE FINDINGS Segmentation:  Standard. Alignment:  Hyperlordosis with mild L5-S1 anterolisthesis Vertebrae: Right L4-5 facet joint effusion with periarticular edema and posteriorly dissecting fluid. No associated marrow edema, but still suspicious in this setting. Conus medullaris: Extends to the L1 level and appears normal. Paraspinal and other soft tissues: 1 cm fluid collection in the upper right quadratus lumborum where there is muscular expansion and edema Partially covered splenomegaly, known Disc levels: Facet spurring mainly at L5-S1 where there is mild anterolisthesis. At the same level the disc is desiccated and mildly narrow with annular fissure. No degenerative impingement IMPRESSION:  Thoracic MRI: No evidence of infection. Lumbar MRI: 1. L4-5 right facet effusion with posterior dissecting periarticular fluid collection, presumed septic facet arthritis in this clinical setting. The collection measures up to 13 mm. 2. 1 cm collection in the upper right quadratus lumborum, presumably intramuscular seeding and abscess. Electronically Signed   By: JJorje GuildM.D.   On: 02/19/2022 12:17  MR THORACIC SPINE W WO CONTRAST  Result Date: 02/19/2022 CLINICAL DATA:  Thoracic and lumbar radiculopathy with infection suspected. Bacteremia EXAM: MRI THORACIC AND LUMBAR SPINE WITHOUT AND WITH CONTRAST TECHNIQUE: Multiplanar and multiecho pulse sequences of the thoracic and lumbar spine were obtained without and with intravenous contrast. CONTRAST:  40m GADAVIST GADOBUTROL 1 MMOL/ML IV SOLN COMPARISON:  None Available. FINDINGS: MRI THORACIC SPINE FINDINGS Alignment:  Exaggerated thoracic kyphosis.  No listhesis. Vertebrae: No fracture, evidence of discitis, or bone lesion. Cord:  Normal appearance when allowing for motion artifact. Paraspinal and other soft tissues: Trace pleural effusion and dependent atelectasis bilaterally. Disc levels: Midthoracic disc space narrowing and spondylitic spurring. No neural impingement MRI LUMBAR SPINE FINDINGS Segmentation:  Standard. Alignment:  Hyperlordosis with mild L5-S1 anterolisthesis Vertebrae: Right L4-5 facet joint effusion with periarticular edema and posteriorly dissecting fluid. No associated marrow edema, but still suspicious in this setting. Conus medullaris: Extends to the L1 level and appears normal. Paraspinal and other soft tissues: 1 cm fluid collection in the upper right quadratus lumborum where there is muscular expansion and edema Partially covered splenomegaly, known Disc levels: Facet spurring mainly at L5-S1 where there is mild anterolisthesis. At the same level the disc is desiccated and mildly narrow with annular fissure. No degenerative  impingement IMPRESSION: Thoracic MRI: No evidence of infection. Lumbar MRI: 1. L4-5 right facet effusion with posterior dissecting periarticular fluid collection, presumed septic facet arthritis in this clinical setting. The collection measures up to 13 mm. 2. 1 cm collection in the upper right quadratus lumborum, presumably intramuscular seeding and abscess. Electronically Signed   By: JJorje GuildM.D.   On: 02/19/2022 12:17      LOS: 2 days    LFlora Lipps MD [facility] Available via Epic secure chat 7am-7pm After these hours, please refer to coverage provider listed on [email or web address] 02/19/2022, 1:30 PM

## 2022-02-19 NOTE — Plan of Care (Signed)

## 2022-02-19 NOTE — Progress Notes (Signed)
        Patient's imaging including MRI of the thoracic and lumbar spine were reviewed.  MRI L-spine shows L4-5 right facet effusion with with dissecting periarticular fluid collection undoubtedly due to septic facet arthritis as well as a 1 cm collection in the right quadratus lumborum.  There had been concern for epidural abscess and potential cord compression causing his worsening weakness, radiculopathy and incontinence.  The MRI does show suspected lumbar infection with epic facet dissecting periarticular fluid and abscess in the right quadratus lumborum there does not appear to be any evidence of epidural abscess or cord compression that would require neurosurgical intervention.  Would continue current antibiotics and will follow up on 2D echocardiogram that was performed today.   Rhina Brack[place] 02/19/2022, 4:09 PM

## 2022-02-20 DIAGNOSIS — K7030 Alcoholic cirrhosis of liver without ascites: Secondary | ICD-10-CM | POA: Diagnosis not present

## 2022-02-20 DIAGNOSIS — K7682 Hepatic encephalopathy: Secondary | ICD-10-CM | POA: Diagnosis not present

## 2022-02-20 DIAGNOSIS — D735 Infarction of spleen: Secondary | ICD-10-CM | POA: Diagnosis not present

## 2022-02-20 DIAGNOSIS — N179 Acute kidney failure, unspecified: Secondary | ICD-10-CM | POA: Diagnosis not present

## 2022-02-20 DIAGNOSIS — D62 Acute posthemorrhagic anemia: Secondary | ICD-10-CM | POA: Diagnosis not present

## 2022-02-20 DIAGNOSIS — K921 Melena: Secondary | ICD-10-CM | POA: Diagnosis not present

## 2022-02-20 LAB — CBC
HCT: 26.4 % — ABNORMAL LOW (ref 39.0–52.0)
Hemoglobin: 8.7 g/dL — ABNORMAL LOW (ref 13.0–17.0)
MCH: 33.2 pg (ref 26.0–34.0)
MCHC: 33 g/dL (ref 30.0–36.0)
MCV: 100.8 fL — ABNORMAL HIGH (ref 80.0–100.0)
Platelets: 101 10*3/uL — ABNORMAL LOW (ref 150–400)
RBC: 2.62 MIL/uL — ABNORMAL LOW (ref 4.22–5.81)
RDW: 18.5 % — ABNORMAL HIGH (ref 11.5–15.5)
WBC: 14 10*3/uL — ABNORMAL HIGH (ref 4.0–10.5)
nRBC: 0 % (ref 0.0–0.2)

## 2022-02-20 LAB — COMPREHENSIVE METABOLIC PANEL
ALT: 15 U/L (ref 0–44)
AST: 42 U/L — ABNORMAL HIGH (ref 15–41)
Albumin: 2.9 g/dL — ABNORMAL LOW (ref 3.5–5.0)
Alkaline Phosphatase: 104 U/L (ref 38–126)
Anion gap: 8 (ref 5–15)
BUN: 83 mg/dL — ABNORMAL HIGH (ref 8–23)
CO2: 20 mmol/L — ABNORMAL LOW (ref 22–32)
Calcium: 8.7 mg/dL — ABNORMAL LOW (ref 8.9–10.3)
Chloride: 117 mmol/L — ABNORMAL HIGH (ref 98–111)
Creatinine, Ser: 1.48 mg/dL — ABNORMAL HIGH (ref 0.61–1.24)
GFR, Estimated: 52 mL/min — ABNORMAL LOW (ref >60)
Glucose, Bld: 118 mg/dL — ABNORMAL HIGH (ref 70–99)
Potassium: 3.8 mmol/L (ref 3.5–5.1)
Sodium: 145 mmol/L (ref 135–145)
Total Bilirubin: 5.2 mg/dL — ABNORMAL HIGH (ref 0.3–1.2)
Total Protein: 6.7 g/dL (ref 6.5–8.1)

## 2022-02-20 LAB — MAGNESIUM: Magnesium: 2.6 mg/dL — ABNORMAL HIGH (ref 1.7–2.4)

## 2022-02-20 MED ORDER — LACTULOSE 10 GM/15ML PO SOLN
20.0000 g | Freq: Three times a day (TID) | ORAL | Status: DC
  Administered 2022-02-20 – 2022-02-23 (×11): 20 g via ORAL
  Filled 2022-02-20 (×11): qty 30

## 2022-02-20 NOTE — Plan of Care (Signed)

## 2022-02-20 NOTE — Progress Notes (Addendum)
PROGRESS NOTE    TIA Marc Ponce  VZD:[id number] DOB: Mar 29, 1956 DOA: 02/16/2022 PCP: [name], MD    Brief Narrative:  Marc Ponce is a 66 y.o. male with past medical history significant of steatohepatitis, chronic alcohol use, hypertension, hyperlipidemia, GERD, diverticulosis presented to the hospital with increasing low back pain with incontinence.  Patient did have a history of back pain/strain on February 11, 2022. Patient also complains of associated generalized fatigue and fall.  He does have a history of significant alcohol abuse and had to stop stopped drinking on October 11 due to his pain.  In the lab work showed hyponatremia with sodium of 133 creatinine was elevated at 2.0.  Glucose was 164, albumin 2.6, AST 82, alk phos 156, T. bili 8.  CBC showed leukocytosis at 18.5, hemoglobin stable at 12.4, platelets stable at 148.  TSH and T4 normal.  Lactic acid initially elevated to 3.2 but improved to 2.5 with IV fluids.  Ammonia was elevated at 86.  Urinalysis with trace hemoglobin only.   X-ray of the left knee showed no acute abnormality, CT head showed no acute abnormality.  CT of the chest abdomen pelvis showed small bilateral pleural effusions with atelectasis, cirrhosis with portal hypertension and splenomegaly with small ascites.  Also noted was moderate splenic infarct.   Magnesium level mildly elevated at 2.8.  Patient received cefepime, Flagyl, Toradol, morphine, lactulose, heparin, IV fluids, B1 in the ED and was considered for admission to the hospital for further evaluation and treatment.  During hospitalization, patient was seen by GI and underwent endoscopic evaluation on 02/19/2022 with oozing from  portal gastropathy. Heparin drip was discontinued.  ID has seen the patient due to bacteremia and MRI of the thoracic and lumbar spine shows L4-L5 right facet effusion with some periarticular fluid collection thought to be septic facet arthritis around 13 mm.  1 cm  collection in the right quadratus lumborum.  Nonsurgical at this time.  On IV cefazolin..  2D echocardiogram with LV ejection fraction of 70 to 75% with no regional wall motion abnormality.  Patient's parameters have started to improve some at this time.  Patient's wife is aware of the guarded prognosis.  Assessment and plan.  Principal Problem:   Splenic infarction Active Problems:   Alcohol abuse, daily use   HTN (hypertension)   Hyperlipidemia   Elevated LFTs   GERD (gastroesophageal reflux disease)   Cirrhosis (HCC)   Leukocytosis   AKI (acute kidney injury) ([place])   Splenic infarct   Thrombocytopenia (HCC)   Melena   Acute blood loss anemia   Secondary esophageal varices without bleeding (HCC)   Portal hypertensive gastropathy ([place])   Ulcer of esophagus without bleeding   Acute hepatic encephalopathy ([place])   Splenic infarct Noted to have splenic infarct on CT scan of the abdomen. Due to GI bleed,  Heparin drip was discontinued.  2D echocardiogram did not show any vegetation.  AKI  Likely secondary to NSAID and poor oral intake.  Could not rule out hepatorenal syndrome.  Received albumin with improving creatinine levels..    Creatinine 1 year ago was around 1.1-1.2.   Creatinine today at 1.4 from 1.8 <2.1.   Continue to hold  losartan-hydrochlorothiazide.  Continue to monitor renal function.   Alcoholic cirrhosis of liver with melena and bright red blood.   GI on board and underwent EGD 02/19/2022 with findings of single column of esophageal varices, small esophageal ulcer which was clean-based, mild portal hypertensive gastropathy with  contact oozing.  Continue Protonix IV twice daily.   Patient will subsequently need MRI of the liver as per GI.  Acute blood loss anemia secondary to GI bleed.  Hemodynamically stable.  No need for transfusion.  Latest hemoglobin of 8.7.  Continue with Protonix IV.  Status post EGD today.  Alcohol use with mild withdrawal. Was a  heavy drinker.   Stopped drinking after February 09, 2022.  No withdrawal symptoms.  Continue CIWA protocol for now.  Continue thiamine, folic acid and multivitamin  Metabolic encephalopathy likely hepatic.  Continue lactulose and rifaximin.  Appears to be more alert awake and communicative today.  Leukocytosis, staph lugdunensis bacteremia with L4-L5 facet septic arthritis. Had recent use of steroids.  Urinalysis negative.  CT scan of abdomen pelvis without any acute source of infection.  Infectious disease on board.  MRI of the lumbar spine showing L4-L5 facet septic arthritis with some collection.  None neurosurgical case..  On IV cefazolin.  Repeat blood cultures on 02/19/2022 with no growth so far.  Will likely need  long-term antibiotics on discharge.  Follow ID recommendation.  Musculoskeletal pain, back pain. MRI has been added to rule out spinal infection.  Continue Robaxin.    GERD Continue IV PPI   Hypertension Continue to hold losartan and HCTZ.  Continue metoprolol.  Overall prognosis of the patient is guarded due to renal failure cirrhosis of liver hepatic encephalopathy upper GI bleed.  Patient's wife is aware of this.  He is however making some improvement at this time.     DVT prophylaxis: Place and maintain sequential compression device Start: 02/19/22 1325      Code Status:     Code Status: Full Code  Disposition: Home with home health likely in 2 to 3 days will likely need PT to reassess him.  Status is: Inpatient  The patient is inpatient because: GI bleed, cirrhosis of liver, splenic infarct, hepatic encephalopathy, status post endoscopy   Family Communication:  I spoke with the patient's spouse at bedside today and updated her about the clinical condition of the patient..   Consultants:  GI Infectious disease. Oncology Dr. Penelope Coop consult  Procedures:  Upper GI endoscopy with biopsy  Antimicrobials:  Cefazolin, rifaximin  Subjective:  Today, patient was seen  and examined at bedside.  Appears to be more alert awake and communicative.  Complains of mild back pain.  Patient's wife at bedside.  No nausea vomiting fever chills.  Has been drinking some fluids.  Objective: Vitals:   02/19/22 0847 02/19/22 1438 02/19/22 2142 02/20/22 0459  BP: (!) 130/55 (!) 132/53 (!) 140/66 (!) 155/63  Pulse: 75 76 85 88  Resp: (!) _0 Temp: 97.6 F (36.4 C) 98 F (36.7 C) 98 F (36.7 C) 97.7 F (36.5 C)  TempSrc: Oral Oral Oral Oral  SpO2: 98% 96% 98% 97%  Weight:      Height:        Intake/Output Summary (Last 24 hours) at 02/20/2022 1116 Last data filed at 02/20/2022 0944 Gross per 24 hour  Intake 875.33 ml  Output 3000 ml  Net -2124.67 ml    Filed Weights   02/17/22 2103 02/18/22 0500 02/19/22 0500  Weight: 97.5 kg 98 kg 99.6 kg    Physical Examination: Body mass index is 34.39 kg/m.   General: Obese built, not in obvious distress, alert awake and Communicative.  Appears weak and deconditioned. HENT:   Mild pallor noted.  Oral mucosa is moist.  Chest:  Clear breath sounds.   No crackles or wheezes.  CVS: S1 &S2 heard. No murmur.  Regular rate and rhythm. Abdomen: Soft, nontender, nondistended.  Bowel sounds are heard.   Extremities: No cyanosis, clubbing with bilateral pitting edema noted  Psych: Alert awake and Communicative.  Oriented CNS:  No cranial nerve deficits.  Moves all extremities. Skin: Warm and dry.  No rashes noted.  Data Reviewed:   CBC: Recent Labs  Lab 02/16/22 1700 02/17/22 0141 02/18/22 0528 02/18/22 1239 02/19/22 0026 02/19/22 0606 02/19/22 1446 02/19/22 1705 02/20/22 0553  WBC 18.5* 16.3* 20.7*  --   --  19.5*  --   --  14.0*  NEUTROABS 15.6*  --   --   --   --   --   --   --   --   HGB 12.4* 11.4* 9.3*   < > 9.6* 8.7* 10.4* 11.3* 8.7*  HCT 36.9* 35.0* 28.7*   < > 30.6* 26.4* 31.4* 35.4* 26.4*  MCV 98.4 100.9* 102.9*  --   --  100.0  --   --  100.8*  PLT 148* 135* 114*  --   --  117*  --   --   101*   < > = values in this interval not displayed.     Basic Metabolic Panel: Recent Labs  Lab 02/16/22 1700 02/17/22 0141 02/18/22 0528 02/19/22 0606 02/20/22 0553  NA 133* 133* 135 137 145  K 4.4 4.2 4.2 3.9 3.8  CL 101 104 110 112* 117*  CO2 21* 20* 18* 18* 20*  GLUCOSE 164* 101* 125* 123* 118*  BUN 85* 85* 107* 91* 83*  CREATININE 2.05* 2.22* 2.19* 1.83* 1.48*  CALCIUM 8.9 7.7* 7.7* 8.0* 8.7*  MG 2.8* 2.7* 2.7* 3.0* 2.6*     Liver Function Tests: Recent Labs  Lab 02/16/22 1700 02/17/22 0141 02/18/22 0528 02/20/22 0553  AST 82* 77* 56* 42*  ALT 40 43 32 15  ALKPHOS 156* 134* 112 104  BILITOT 8.0* 7.0* 5.6* 5.2*  PROT 7.2 6.5 5.7* 6.7  ALBUMIN 2.6* 1.8* 1.6* 2.9*      Radiology Studies: ECHOCARDIOGRAM COMPLETE  Result Date: 02/19/2022    ECHOCARDIOGRAM REPORT   Patient Name:   LAURENCE CROFFORD Date of Exam: 02/19/2022 Medical Rec #:  [id number]        Height:       67.0 in Accession #:    [id number]       Weight:       219.6 lb Date of Birth:  1955-10-17       BSA:          2.104 m Patient Age:    67 years         BP:           134/53 mmHg Patient Gender: M                HR:           71 bpm. Exam Location:  Inpatient Procedure: 2D Echo Indications:    Bacteremia  History:        Patient has prior history of Echocardiogram examinations, most                 recent 04/28/2017. Risk Factors:Hypertension.  Sonographer:    Harvie Junior Referring Phys: [id number] [place]  Sonographer Comments: Technically difficult study due to poor echo windows. Image acquisition challenging due to patient body habitus. IMPRESSIONS  1. Left ventricular ejection fraction,  by estimation, is 70 to 75%. The left ventricle has hyperdynamic function. The left ventricle has no regional wall motion abnormalities. Left ventricular diastolic parameters were normal.  2. Right ventricular systolic function is normal. The right ventricular size is normal. There is normal pulmonary artery systolic  pressure.  3. The mitral valve is normal in structure. No evidence of mitral valve regurgitation. No evidence of mitral stenosis.  4. The aortic valve is tricuspid. There is moderate calcification of the aortic valve. There is moderate thickening of the aortic valve. Aortic valve regurgitation is not visualized. No aortic stenosis is present.  5. The inferior vena cava is normal in size with greater than 50% respiratory variability, suggesting right atrial pressure of 3 mmHg. FINDINGS  Left Ventricle: Left ventricular ejection fraction, by estimation, is 70 to 75%. The left ventricle has hyperdynamic function. The left ventricle has no regional wall motion abnormalities. The left ventricular internal cavity size was normal in size. There is no left ventricular hypertrophy. Left ventricular diastolic parameters were normal. Right Ventricle: The right ventricular size is normal. Right vetricular wall thickness was not well visualized. Right ventricular systolic function is normal. There is normal pulmonary artery systolic pressure. The tricuspid regurgitant velocity is 2.38 m/s, and with an assumed right atrial pressure of 3 mmHg, the estimated right ventricular systolic pressure is 35.8 mmHg. Left Atrium: Left atrial size was normal in size. Right Atrium: Right atrial size was normal in size. Pericardium: There is no evidence of pericardial effusion. Mitral Valve: The mitral valve is normal in structure. Mild mitral annular calcification. No evidence of mitral valve regurgitation. No evidence of mitral valve stenosis. Tricuspid Valve: The tricuspid valve is normal in structure. Tricuspid valve regurgitation is trivial. No evidence of tricuspid stenosis. Aortic Valve: The aortic valve is tricuspid. There is moderate calcification of the aortic valve. There is moderate thickening of the aortic valve. There is moderate aortic valve annular calcification. Aortic valve regurgitation is not visualized. No aortic stenosis  is present. Aortic valve mean gradient measures 7.0 mmHg. Aortic valve peak gradient measures 12.1 mmHg. Aortic valve area, by VTI measures 2.26 cm. Pulmonic Valve: The pulmonic valve was not well visualized. Pulmonic valve regurgitation is not visualized. No evidence of pulmonic stenosis. Aorta: The aortic root is normal in size and structure. Venous: The inferior vena cava is normal in size with greater than 50% respiratory variability, suggesting right atrial pressure of 3 mmHg. IAS/Shunts: No atrial level shunt detected by color flow Doppler.  LEFT VENTRICLE PLAX 2D LVIDd:         4.40 cm      Diastology LVIDs:         2.30 cm      LV e' medial:    8.81 cm/s LV PW:         0.90 cm      LV E/e' medial:  13.1 LV IVS:        0.90 cm      LV e' lateral:   11.40 cm/s LVOT diam:     2.10 cm      LV E/e' lateral: 10.1 LV SV:         80 LV SV Index:   38 LVOT Area:     3.46 cm  LV Volumes (MOD) LV vol d, MOD A2C: 119.0 ml LV vol d, MOD A4C: 147.0 ml LV vol s, MOD A2C: 43.1 ml LV vol s, MOD A4C: 55.7 ml LV SV MOD A2C:  75.9 ml LV SV MOD A4C:     147.0 ml LV SV MOD BP:      82.3 ml RIGHT VENTRICLE RV Basal diam:  3.60 cm RV Mid diam:    3.20 cm RV S prime:     12.40 cm/s TAPSE (M-mode): 2.2 cm LEFT ATRIUM             Index        RIGHT ATRIUM           Index LA diam:        3.60 cm 1.71 cm/m   RA Area:     14.00 cm LA Vol (A2C):   58.4 ml 27.75 ml/m  RA Volume:   35.40 ml  16.82 ml/m LA Vol (A4C):   58.0 ml 27.56 ml/m LA Biplane Vol: 63.2 ml 30.04 ml/m  AORTIC VALVE                     PULMONIC VALVE AV Area (Vmax):    2.07 cm      PV Vmax:       1.09 m/s AV Area (Vmean):   2.02 cm      PV Peak grad:  4.8 mmHg AV Area (VTI):     2.26 cm AV Vmax:           174.00 cm/s AV Vmean:          117.000 cm/s AV VTI:            0.352 m AV Peak Grad:      12.1 mmHg AV Mean Grad:      7.0 mmHg LVOT Vmax:         104.00 cm/s LVOT Vmean:        68.100 cm/s LVOT VTI:          0.230 m LVOT/AV VTI ratio: 0.65  AORTA Ao Root  diam: 3.60 cm Ao Asc diam:  3.40 cm MITRAL VALVE                TRICUSPID VALVE MV Area (PHT): 3.31 cm     TR Peak grad:   22.7 mmHg MV Decel Time: 229 msec     TR Vmax:        238.00 cm/s MV E velocity: 115.00 cm/s MV A velocity: 75.40 cm/s   SHUNTS MV E/A ratio:  1.53         Systemic VTI:  0.23 m                             Systemic Diam: 2.10 cm Carlyle Dolly MD Electronically signed by Carlyle Dolly MD Signature Date/Time: 02/19/2022/3:09:15 PM    Final    MR Lumbar Spine W Wo Contrast  Result Date: 02/19/2022 CLINICAL DATA:  Thoracic and lumbar radiculopathy with infection suspected. Bacteremia EXAM: MRI THORACIC AND LUMBAR SPINE WITHOUT AND WITH CONTRAST TECHNIQUE: Multiplanar and multiecho pulse sequences of the thoracic and lumbar spine were obtained without and with intravenous contrast. CONTRAST:  44m GADAVIST GADOBUTROL 1 MMOL/ML IV SOLN COMPARISON:  None Available. FINDINGS: MRI THORACIC SPINE FINDINGS Alignment:  Exaggerated thoracic kyphosis.  No listhesis. Vertebrae: No fracture, evidence of discitis, or bone lesion. Cord:  Normal appearance when allowing for motion artifact. Paraspinal and other soft tissues: Trace pleural effusion and dependent atelectasis bilaterally. Disc levels: Midthoracic disc space narrowing and spondylitic spurring. No neural impingement MRI LUMBAR SPINE FINDINGS Segmentation:  Standard. Alignment:  Hyperlordosis with mild L5-S1 anterolisthesis Vertebrae: Right L4-5 facet joint effusion with periarticular edema and posteriorly dissecting fluid. No associated marrow edema, but still suspicious in this setting. Conus medullaris: Extends to the L1 level and appears normal. Paraspinal and other soft tissues: 1 cm fluid collection in the upper right quadratus lumborum where there is muscular expansion and edema Partially covered splenomegaly, known Disc levels: Facet spurring mainly at L5-S1 where there is mild anterolisthesis. At the same level the disc is desiccated  and mildly narrow with annular fissure. No degenerative impingement IMPRESSION: Thoracic MRI: No evidence of infection. Lumbar MRI: 1. L4-5 right facet effusion with posterior dissecting periarticular fluid collection, presumed septic facet arthritis in this clinical setting. The collection measures up to 13 mm. 2. 1 cm collection in the upper right quadratus lumborum, presumably intramuscular seeding and abscess. Electronically Signed   By: Jorje Guild M.D.   On: 02/19/2022 12:17   MR THORACIC SPINE W WO CONTRAST  Result Date: 02/19/2022 CLINICAL DATA:  Thoracic and lumbar radiculopathy with infection suspected. Bacteremia EXAM: MRI THORACIC AND LUMBAR SPINE WITHOUT AND WITH CONTRAST TECHNIQUE: Multiplanar and multiecho pulse sequences of the thoracic and lumbar spine were obtained without and with intravenous contrast. CONTRAST:  37m GADAVIST GADOBUTROL 1 MMOL/ML IV SOLN COMPARISON:  None Available. FINDINGS: MRI THORACIC SPINE FINDINGS Alignment:  Exaggerated thoracic kyphosis.  No listhesis. Vertebrae: No fracture, evidence of discitis, or bone lesion. Cord:  Normal appearance when allowing for motion artifact. Paraspinal and other soft tissues: Trace pleural effusion and dependent atelectasis bilaterally. Disc levels: Midthoracic disc space narrowing and spondylitic spurring. No neural impingement MRI LUMBAR SPINE FINDINGS Segmentation:  Standard. Alignment:  Hyperlordosis with mild L5-S1 anterolisthesis Vertebrae: Right L4-5 facet joint effusion with periarticular edema and posteriorly dissecting fluid. No associated marrow edema, but still suspicious in this setting. Conus medullaris: Extends to the L1 level and appears normal. Paraspinal and other soft tissues: 1 cm fluid collection in the upper right quadratus lumborum where there is muscular expansion and edema Partially covered splenomegaly, known Disc levels: Facet spurring mainly at L5-S1 where there is mild anterolisthesis. At the same level  the disc is desiccated and mildly narrow with annular fissure. No degenerative impingement IMPRESSION: Thoracic MRI: No evidence of infection. Lumbar MRI: 1. L4-5 right facet effusion with posterior dissecting periarticular fluid collection, presumed septic facet arthritis in this clinical setting. The collection measures up to 13 mm. 2. 1 cm collection in the upper right quadratus lumborum, presumably intramuscular seeding and abscess. Electronically Signed   By: JJorje GuildM.D.   On: 02/19/2022 12:17      LOS: 3 days    LFlora Lipps MD [facility] Available via Epic secure chat 7am-7pm After these hours, please refer to coverage provider listed on [email or web address] 02/20/2022, 11:16 AM

## 2022-02-20 NOTE — Progress Notes (Addendum)
[facility] ROUNDING NOTE   Subjective: EGD completed yesterday and notable for grade 1 esophageal varices, mild PHG, and ulcer at the GE junction.  Pathology results pending.  Continue high-dose PPI, added Carafate.  Is also already on Toprol-XL.  Also started rifaximin.  MRI L-spine with right facet dissecting periarticular fluid and abscess without cord compression.  TTE with EF 70-75% and no clear vegetations.  Repeat labs reviewed as below.  Renal function improving  Mental status much improved.    Objective: Vital signs in last 24 hours: Temp:  [97.7 F (36.5 C)-98 F (36.7 C)] 97.7 F (36.5 C) (10/22 0459) Pulse Rate:  [76-88] 88 (10/22 0459) Resp:  [18-20] 20 (10/22 0459) BP: (132-155)/(53-66) 155/63 (10/22 0459) SpO2:  [96 %-98 %] 97 % (10/22 0459) Last BM Date : 02/18/22 General: NAD Lungs:  CTA b/l, no w/r/r Heart:  RRR, no m/r/g Abdomen:  Soft, NT, ND, +BS Neuro: More alert today.  Able to accurately provide month/year, location, and reason he is in the hospital "back pain and now I have new liver issues"   Intake/Output from previous day: 10/21 0701 - 10/22 0700 In: 1057.3 [P.O.:118; I.V.:310; IV Piggyback:629.3] Out: 1900 [Urine:1900] Intake/Output this shift: Total I/O In: 118 [P.O.:118] Out: 1100 [Urine:1100]   Lab Results: Recent Labs    02/18/22 0528 02/18/22 1239 02/19/22 0606 02/19/22 1446 02/19/22 1705 02/20/22 0553  WBC 20.7*  --  19.5*  --   --  14.0*  HGB 9.3*   < > 8.7* 10.4* 11.3* 8.7*  PLT 114*  --  117*  --   --  101*  MCV 102.9*  --  100.0  --   --  100.8*   < > = values in this interval not displayed.   BMET Recent Labs    02/18/22 0528 02/19/22 0606 02/20/22 0553  NA 135 137 145  K 4.2 3.9 3.8  CL 110 112* 117*  CO2 18* 18* 20*  GLUCOSE 125* 123* 118*  BUN 107* 91* 83*  CREATININE 2.19* 1.83* 1.48*  CALCIUM 7.7* 8.0* 8.7*   LFT Recent Labs    02/18/22 0528 02/20/22 0553  PROT 5.7* 6.7  ALBUMIN 1.6*  2.9*  AST 56* 42*  ALT 32 15  ALKPHOS 112 104  BILITOT 5.6* 5.2*   PT/INR Recent Labs    02/18/22 0528  INR 2.3*      Imaging/Other results: ECHOCARDIOGRAM COMPLETE  Result Date: 02/19/2022    ECHOCARDIOGRAM REPORT   Patient Name:   Marc Ponce Date of Exam: 02/19/2022 Medical Rec #:  [id number]        Height:       67.0 in Accession #:    [id number]       Weight:       219.6 lb Date of Birth:  24-Jan-1956       BSA:          2.104 m Patient Age:    66 years         BP:           134/53 mmHg Patient Gender: M                HR:           71 bpm. Exam Location:  Inpatient Procedure: 2D Echo Indications:    Bacteremia  History:        Patient has prior history of Echocardiogram examinations, most  recent 04/28/2017. Risk Factors:Hypertension.  Sonographer:    Harvie Junior Referring Phys: [id number] [place]  Sonographer Comments: Technically difficult study due to poor echo windows. Image acquisition challenging due to patient body habitus. IMPRESSIONS  1. Left ventricular ejection fraction, by estimation, is 70 to 75%. The left ventricle has hyperdynamic function. The left ventricle has no regional wall motion abnormalities. Left ventricular diastolic parameters were normal.  2. Right ventricular systolic function is normal. The right ventricular size is normal. There is normal pulmonary artery systolic pressure.  3. The mitral valve is normal in structure. No evidence of mitral valve regurgitation. No evidence of mitral stenosis.  4. The aortic valve is tricuspid. There is moderate calcification of the aortic valve. There is moderate thickening of the aortic valve. Aortic valve regurgitation is not visualized. No aortic stenosis is present.  5. The inferior vena cava is normal in size with greater than 50% respiratory variability, suggesting right atrial pressure of 3 mmHg. FINDINGS  Left Ventricle: Left ventricular ejection fraction, by estimation, is 70 to 75%. The left  ventricle has hyperdynamic function. The left ventricle has no regional wall motion abnormalities. The left ventricular internal cavity size was normal in size. There is no left ventricular hypertrophy. Left ventricular diastolic parameters were normal. Right Ventricle: The right ventricular size is normal. Right vetricular wall thickness was not well visualized. Right ventricular systolic function is normal. There is normal pulmonary artery systolic pressure. The tricuspid regurgitant velocity is 2.38 m/s, and with an assumed right atrial pressure of 3 mmHg, the estimated right ventricular systolic pressure is 78.4 mmHg. Left Atrium: Left atrial size was normal in size. Right Atrium: Right atrial size was normal in size. Pericardium: There is no evidence of pericardial effusion. Mitral Valve: The mitral valve is normal in structure. Mild mitral annular calcification. No evidence of mitral valve regurgitation. No evidence of mitral valve stenosis. Tricuspid Valve: The tricuspid valve is normal in structure. Tricuspid valve regurgitation is trivial. No evidence of tricuspid stenosis. Aortic Valve: The aortic valve is tricuspid. There is moderate calcification of the aortic valve. There is moderate thickening of the aortic valve. There is moderate aortic valve annular calcification. Aortic valve regurgitation is not visualized. No aortic stenosis is present. Aortic valve mean gradient measures 7.0 mmHg. Aortic valve peak gradient measures 12.1 mmHg. Aortic valve area, by VTI measures 2.26 cm. Pulmonic Valve: The pulmonic valve was not well visualized. Pulmonic valve regurgitation is not visualized. No evidence of pulmonic stenosis. Aorta: The aortic root is normal in size and structure. Venous: The inferior vena cava is normal in size with greater than 50% respiratory variability, suggesting right atrial pressure of 3 mmHg. IAS/Shunts: No atrial level shunt detected by color flow Doppler.  LEFT VENTRICLE PLAX 2D  LVIDd:         4.40 cm      Diastology LVIDs:         2.30 cm      LV e' medial:    8.81 cm/s LV PW:         0.90 cm      LV E/e' medial:  13.1 LV IVS:        0.90 cm      LV e' lateral:   11.40 cm/s LVOT diam:     2.10 cm      LV E/e' lateral: 10.1 LV SV:         80 LV SV Index:   38 LVOT Area:  3.46 cm  LV Volumes (MOD) LV vol d, MOD A2C: 119.0 ml LV vol d, MOD A4C: 147.0 ml LV vol s, MOD A2C: 43.1 ml LV vol s, MOD A4C: 55.7 ml LV SV MOD A2C:     75.9 ml LV SV MOD A4C:     147.0 ml LV SV MOD BP:      82.3 ml RIGHT VENTRICLE RV Basal diam:  3.60 cm RV Mid diam:    3.20 cm RV S prime:     12.40 cm/s TAPSE (M-mode): 2.2 cm LEFT ATRIUM             Index        RIGHT ATRIUM           Index LA diam:        3.60 cm 1.71 cm/m   RA Area:     14.00 cm LA Vol (A2C):   58.4 ml 27.75 ml/m  RA Volume:   35.40 ml  16.82 ml/m LA Vol (A4C):   58.0 ml 27.56 ml/m LA Biplane Vol: 63.2 ml 30.04 ml/m  AORTIC VALVE                     PULMONIC VALVE AV Area (Vmax):    2.07 cm      PV Vmax:       1.09 m/s AV Area (Vmean):   2.02 cm      PV Peak grad:  4.8 mmHg AV Area (VTI):     2.26 cm AV Vmax:           174.00 cm/s AV Vmean:          117.000 cm/s AV VTI:            0.352 m AV Peak Grad:      12.1 mmHg AV Mean Grad:      7.0 mmHg LVOT Vmax:         104.00 cm/s LVOT Vmean:        68.100 cm/s LVOT VTI:          0.230 m LVOT/AV VTI ratio: 0.65  AORTA Ao Root diam: 3.60 cm Ao Asc diam:  3.40 cm MITRAL VALVE                TRICUSPID VALVE MV Area (PHT): 3.31 cm     TR Peak grad:   22.7 mmHg MV Decel Time: 229 msec     TR Vmax:        238.00 cm/s MV E velocity: 115.00 cm/s MV A velocity: 75.40 cm/s   SHUNTS MV E/A ratio:  1.53         Systemic VTI:  0.23 m                             Systemic Diam: 2.10 cm Carlyle Dolly MD Electronically signed by Carlyle Dolly MD Signature Date/Time: 02/19/2022/3:09:15 PM    Final    MR Lumbar Spine W Wo Contrast  Result Date: 02/19/2022 CLINICAL DATA:  Thoracic and lumbar  radiculopathy with infection suspected. Bacteremia EXAM: MRI THORACIC AND LUMBAR SPINE WITHOUT AND WITH CONTRAST TECHNIQUE: Multiplanar and multiecho pulse sequences of the thoracic and lumbar spine were obtained without and with intravenous contrast. CONTRAST:  47m GADAVIST GADOBUTROL 1 MMOL/ML IV SOLN COMPARISON:  None Available. FINDINGS: MRI THORACIC SPINE FINDINGS Alignment:  Exaggerated thoracic kyphosis.  No listhesis. Vertebrae: No fracture, evidence of discitis, or bone  lesion. Cord:  Normal appearance when allowing for motion artifact. Paraspinal and other soft tissues: Trace pleural effusion and dependent atelectasis bilaterally. Disc levels: Midthoracic disc space narrowing and spondylitic spurring. No neural impingement MRI LUMBAR SPINE FINDINGS Segmentation:  Standard. Alignment:  Hyperlordosis with mild L5-S1 anterolisthesis Vertebrae: Right L4-5 facet joint effusion with periarticular edema and posteriorly dissecting fluid. No associated marrow edema, but still suspicious in this setting. Conus medullaris: Extends to the L1 level and appears normal. Paraspinal and other soft tissues: 1 cm fluid collection in the upper right quadratus lumborum where there is muscular expansion and edema Partially covered splenomegaly, known Disc levels: Facet spurring mainly at L5-S1 where there is mild anterolisthesis. At the same level the disc is desiccated and mildly narrow with annular fissure. No degenerative impingement IMPRESSION: Thoracic MRI: No evidence of infection. Lumbar MRI: 1. L4-5 right facet effusion with posterior dissecting periarticular fluid collection, presumed septic facet arthritis in this clinical setting. The collection measures up to 13 mm. 2. 1 cm collection in the upper right quadratus lumborum, presumably intramuscular seeding and abscess. Electronically Signed   By: Jorje Guild M.D.   On: 02/19/2022 12:17   MR THORACIC SPINE W WO CONTRAST  Result Date: 02/19/2022 CLINICAL  DATA:  Thoracic and lumbar radiculopathy with infection suspected. Bacteremia EXAM: MRI THORACIC AND LUMBAR SPINE WITHOUT AND WITH CONTRAST TECHNIQUE: Multiplanar and multiecho pulse sequences of the thoracic and lumbar spine were obtained without and with intravenous contrast. CONTRAST:  49m GADAVIST GADOBUTROL 1 MMOL/ML IV SOLN COMPARISON:  None Available. FINDINGS: MRI THORACIC SPINE FINDINGS Alignment:  Exaggerated thoracic kyphosis.  No listhesis. Vertebrae: No fracture, evidence of discitis, or bone lesion. Cord:  Normal appearance when allowing for motion artifact. Paraspinal and other soft tissues: Trace pleural effusion and dependent atelectasis bilaterally. Disc levels: Midthoracic disc space narrowing and spondylitic spurring. No neural impingement MRI LUMBAR SPINE FINDINGS Segmentation:  Standard. Alignment:  Hyperlordosis with mild L5-S1 anterolisthesis Vertebrae: Right L4-5 facet joint effusion with periarticular edema and posteriorly dissecting fluid. No associated marrow edema, but still suspicious in this setting. Conus medullaris: Extends to the L1 level and appears normal. Paraspinal and other soft tissues: 1 cm fluid collection in the upper right quadratus lumborum where there is muscular expansion and edema Partially covered splenomegaly, known Disc levels: Facet spurring mainly at L5-S1 where there is mild anterolisthesis. At the same level the disc is desiccated and mildly narrow with annular fissure. No degenerative impingement IMPRESSION: Thoracic MRI: No evidence of infection. Lumbar MRI: 1. L4-5 right facet effusion with posterior dissecting periarticular fluid collection, presumed septic facet arthritis in this clinical setting. The collection measures up to 13 mm. 2. 1 cm collection in the upper right quadratus lumborum, presumably intramuscular seeding and abscess. Electronically Signed   By: JJorje GuildM.D.   On: 02/19/2022 12:17      Assessment and Plan:  1) Decompensated  EtOH cirrhosis 2) Esophageal varices 3) Portal hypertensive gastropathy 4) Hepatic encephalopathy 5) EtOH use disorder 66year old male admitted with back pain and incidentally noted to have essentially newly diagnosed EtOH cirrhosis.  EGD on 02/19/2022 with grade 1 esophageal varices, mild PHG with contact oozing, and ulcer at the GE junction.  MELD: 29 Child-Pugh: C  - Continue rifaximin/lactulose - Continue Protonix 40 mg PO BID for 6 weeks, then reduce to 40 mg daily - Continue sucralfate 1 g qid for 4 weeks - Continue Toprol XL which will help treatment of esophageal varices in this patient with  advanced Child C cirrhosis - Pathology pending from EGD on 02/19/2022 - CIWA protocol - Stopped anticoagulation - Continue folic acid, thiamine - Will eventually need MRI three-phase liver without and with contrast for [facility] screening and evaluation of indeterminate 2 cm hypodense lesion in the caudate lobe.  Can be done as outpatient  6) Splenic infarct Unclear duration.  Was initially started on a heparin GTT, but due to GI bleed this was stopped.  Discussed with Hematology and risks outweigh benefits of restarting anticoagulation  7) L spine infection Blood cultures +with staph lugdunensis. MRI L-spine on 10/21 with L4-5 right facet effusion with 13 mm posterior dissecting periarticular fluid collection, presumed septic facet arthritis.  TTE on 10/21 without clear vegetations - Antimicrobial therapy per consulting ID service and primary Hospitalist service  8) AKI - Good improvement in renal function with IV albumin, therefore do not suspect this is HRS - Continue daily trend - Holding ARB - Management per primary Hospitalist service  9) Acute blood loss anemia 10) Esophageal ulcer Suspect ABLA 2/2 oozing from portal hypertensive gastropathy in the setting of systemic anticoagulation.  H/H now stable and no overt bleeding - Anticoagulation held - PPI and Carafate as above - Daily  CBC trend  Inpatient GI service will sign off at this time.  Please do not hesitate to contact us with additional questions or concerns   Lavena Bullion, DO  02/20/2022, 11:23 AM [facility] Pager [phone]

## 2022-02-21 ENCOUNTER — Encounter (HOSPITAL_COMMUNITY): Payer: Self-pay | Admitting: Gastroenterology

## 2022-02-21 DIAGNOSIS — D735 Infarction of spleen: Secondary | ICD-10-CM

## 2022-02-21 LAB — COMPREHENSIVE METABOLIC PANEL
ALT: 14 U/L (ref 0–44)
AST: 41 U/L (ref 15–41)
Albumin: 3.6 g/dL (ref 3.5–5.0)
Alkaline Phosphatase: 86 U/L (ref 38–126)
Anion gap: 7 (ref 5–15)
BUN: 61 mg/dL — ABNORMAL HIGH (ref 8–23)
CO2: 21 mmol/L — ABNORMAL LOW (ref 22–32)
Calcium: 8.9 mg/dL (ref 8.9–10.3)
Chloride: 117 mmol/L — ABNORMAL HIGH (ref 98–111)
Creatinine, Ser: 1.26 mg/dL — ABNORMAL HIGH (ref 0.61–1.24)
GFR, Estimated: >60 mL/min (ref >60)
Glucose, Bld: 121 mg/dL — ABNORMAL HIGH (ref 70–99)
Potassium: 3.9 mmol/L (ref 3.5–5.1)
Sodium: 145 mmol/L (ref 135–145)
Total Bilirubin: 5.4 mg/dL — ABNORMAL HIGH (ref 0.3–1.2)
Total Protein: 6.8 g/dL (ref 6.5–8.1)

## 2022-02-21 LAB — CBC
HCT: 22.6 % — ABNORMAL LOW (ref 39.0–52.0)
Hemoglobin: 7.2 g/dL — ABNORMAL LOW (ref 13.0–17.0)
MCH: 33 pg (ref 26.0–34.0)
MCHC: 31.9 g/dL (ref 30.0–36.0)
MCV: 103.7 fL — ABNORMAL HIGH (ref 80.0–100.0)
Platelets: 83 10*3/uL — ABNORMAL LOW (ref 150–400)
RBC: 2.18 MIL/uL — ABNORMAL LOW (ref 4.22–5.81)
RDW: 18.6 % — ABNORMAL HIGH (ref 11.5–15.5)
WBC: 11.8 10*3/uL — ABNORMAL HIGH (ref 4.0–10.5)
nRBC: 0.2 % (ref 0.0–0.2)

## 2022-02-21 LAB — MAGNESIUM: Magnesium: 2.5 mg/dL — ABNORMAL HIGH (ref 1.7–2.4)

## 2022-02-21 LAB — AMMONIA: Ammonia: 101 umol/L — ABNORMAL HIGH (ref 9–35)

## 2022-02-21 MED ORDER — SUCRALFATE 1 GM/10ML PO SUSP
1.0000 g | Freq: Three times a day (TID) | ORAL | Status: DC
  Administered 2022-02-21 – 2022-02-28 (×27): 1 g via ORAL
  Filled 2022-02-21 (×27): qty 10

## 2022-02-21 MED ORDER — LIP MEDEX EX OINT
TOPICAL_OINTMENT | CUTANEOUS | Status: DC | PRN
  Administered 2022-02-21: 1 via TOPICAL
  Filled 2022-02-21: qty 7

## 2022-02-21 MED ORDER — SALINE SPRAY 0.65 % NA SOLN
1.0000 | NASAL | Status: DC | PRN
  Filled 2022-02-21: qty 44

## 2022-02-21 MED ORDER — RIFAXIMIN 550 MG PO TABS
550.0000 mg | ORAL_TABLET | Freq: Two times a day (BID) | ORAL | Status: DC
  Administered 2022-02-21 – 2022-02-28 (×15): 550 mg via ORAL
  Filled 2022-02-21 (×15): qty 1

## 2022-02-21 NOTE — Plan of Care (Signed)

## 2022-02-21 NOTE — Plan of Care (Signed)
Id brief update   Septic l4-5 arthritis and quadratus lumborum small abscess in setting staph lugdenensis bacteremia. Tte no obvious vegetation  Cirrhosis on rifaximine  Portal hypertensive gastropathy - egd oozing  Splenic infarct started on but stopped anticoagulation in setting oozing portal hypertensive gastropathy changes  10/18 bcx positive 10/21 bcx negative  Responding to cefazolin; wbc improving Thrombocytopenia ?sepsis related too early for cefazolin  10/21 tte  1. Left ventricular ejection fraction, by estimation, is 70 to 75%. The  left ventricle has hyperdynamic function. The left ventricle has no  regional wall motion abnormalities. Left ventricular diastolic parameters  were normal.   2. Right ventricular systolic function is normal. The right ventricular  size is normal. There is normal pulmonary artery systolic pressure.   3. The mitral valve is normal in structure. No evidence of mitral valve  regurgitation. No evidence of mitral stenosis.   4. The aortic valve is tricuspid. There is moderate calcification of the  aortic valve. There is moderate thickening of the aortic valve. Aortic  valve regurgitation is not visualized. No aortic stenosis is present.   5. The inferior vena cava is normal in size with greater than 50%  respiratory variability, suggesting right atrial pressure of 3 mmHg.   10/21 thoracic lumbar mri IMPRESSION: Thoracic MRI:   No evidence of infection.   Lumbar MRI:   1. L4-5 right facet effusion with posterior dissecting periarticular fluid collection, presumed septic facet arthritis in this clinical setting. The collection measures up to 13 mm. 2. 1 cm collection in the upper right quadratus lumborum, presumably intramuscular seeding and abscess.   A/p Staph lugdenensis bsi Lumbar om Quadratus lumborum abscess 1 cm  -continue cefazolin for now -pending goals of care and other medical issues resolution will need at least 6 weeks  treatment with abx if not more

## 2022-02-21 NOTE — Care Management Important Message (Signed)
Important Message  Patient Details IM Letter given Name: Marc Ponce MRN: [id number] Date of Birth: April 25, 1956   Medicare Important Message Given:  Yes     Kerin Salen 02/21/2022, 2:34 PM

## 2022-02-21 NOTE — Progress Notes (Signed)
Alerted by nursing staff that pt wife had concerns about patients care from last night's shift.  Leadership rounded with the wife and pt, heard her concerns regarding patient being soiled and appeared to not have been offered liquids during the night.  Service recovery provided. No additional nursing concerns at this time.  Wife concerned about patients prognosis.  Leadership reviewed progress notes and lab work with the wife and reassured wife that the MD would be rounding and will address any medical concerns at that time.  Wife tearful, yet verbalized understanding and appreciative of visit.

## 2022-02-21 NOTE — Progress Notes (Signed)
PROGRESS NOTE    DALONTE Ponce  HAL:[id number] DOB: 10-Jul-1955 DOA: 02/16/2022 PCP: [name], MD    Brief Narrative:  Marc Ponce is a 66 y.o. male with past medical history significant of steatohepatitis, chronic alcohol use, hypertension, hyperlipidemia, GERD, diverticulosis presented to the hospital with increasing low back pain with incontinence.  Patient did have a history of back pain/strain on February 11, 2022. Patient also complains of associated generalized fatigue and fall.  He does have a history of significant alcohol abuse and had to stop stopped drinking on October 11 due to his pain.  In the lab work showed hyponatremia with sodium of 133 creatinine was elevated at 2.0.  Glucose was 164, albumin 2.6, AST 82, alk phos 156, T. bili 8.  CBC showed leukocytosis at 18.5, hemoglobin stable at 12.4, platelets stable at 148.  TSH and T4 normal.  Lactic acid initially elevated to 3.2 but improved to 2.5 with IV fluids.  Ammonia was elevated at 86.  Urinalysis with trace hemoglobin only.   X-ray of the left knee showed no acute abnormality, CT head showed no acute abnormality.  CT of the chest abdomen pelvis showed small bilateral pleural effusions with atelectasis, cirrhosis with portal hypertension and splenomegaly with small ascites.  Also noted was moderate splenic infarct.   Magnesium level mildly elevated at 2.8.  Patient received cefepime, Flagyl, Toradol, morphine, lactulose, heparin, IV fluids, B1 in the ED and was considered for admission to the hospital for further evaluation and treatment.  During hospitalization, patient was seen by GI and underwent endoscopic evaluation on 02/19/2022 with oozing from  portal gastropathy. Heparin drip was discontinued.  ID has seen the patient due to bacteremia and MRI of the thoracic and lumbar spine shows L4-L5 right facet effusion with some periarticular fluid collection thought to be septic facet arthritis around 13 mm.  1 cm  collection in the right quadratus lumborum.  Nonsurgical at this time.  On IV cefazolin..  2D echocardiogram with LV ejection fraction of 70 to 75% with no regional wall motion abnormality.   Patient remains very debilitated, lethargic and difficult to to improve overall.   Assessment and plan.  Principal Problem:   Splenic infarction Active Problems:   Alcohol abuse, daily use   HTN (hypertension)   Hyperlipidemia   Elevated LFTs   GERD (gastroesophageal reflux disease)   Cirrhosis (HCC)   Leukocytosis   AKI (acute kidney injury) ([place])   Splenic infarct   Thrombocytopenia (HCC)   Melena   Acute blood loss anemia   Secondary esophageal varices without bleeding (HCC)   Portal hypertensive gastropathy ([place])   Ulcer of esophagus without bleeding   Acute hepatic encephalopathy ([place])   Splenic infarct Noted to have splenic infarct on CT scan of the abdomen. Due to GI bleed,  Heparin drip was discontinued.  2D echocardiogram did not show any vegetation.  Not a candidate for anticoagulation at this time.  AKI  Likely secondary to NSAID and poor oral intake.  Could not rule out hepatorenal syndrome.  Received albumin with improving creatinine levels..  Creatinine 1.26. from 1.8 <2.1.   Continue to hold  losartan-hydrochlorothiazide.  Continue to monitor renal function.   Alcoholic cirrhosis of liver with melena and bright red blood.   GI on board and underwent EGD 02/19/2022 with findings of single column of esophageal varices, small esophageal ulcer which was clean-based, mild portal hypertensive gastropathy with contact oozing.  Continue Protonix IV twice daily.  Now  with severe debilitated condition and developing hepatic encephalopathy. Started on rifaximin.  Currently on lactulose and having loose bowel movements. Can use Flexi-Seal. Prognosis is guarded.  See goal of care discussion below.  Acute blood loss anemia secondary to GI bleed.  Hemodynamically stable.  No need for  transfusion.  Latest hemoglobin of 7.2.  Continue with Protonix IV.  We will transfuse for less than 7.  Alcohol use with mild withdrawal. Was a  heavy drinker.  Stopped drinking after February 09, 2022.  No withdrawal symptoms.  Continue CIWA protocol for now.  Continue thiamine, folic acid and multivitamin  Leukocytosis, staph lugdunensis bacteremia with L4-L5 facet septic arthritis. Had recent use of steroids.  Urinalysis negative.  CT scan of abdomen pelvis without any acute source of infection.  Infectious disease on board.  MRI of the lumbar spine showing L4-L5 facet septic arthritis with some collection.  None neurosurgical case..  On IV cefazolin.  Repeat blood cultures on 02/19/2022 with no growth so far.  Will likely need  long-term antibiotics on discharge.  ID following.  GERD Continue IV PPI   Hypertension Continue to hold losartan and HCTZ.  Continue metoprolol.  Goal of care discussion:  Met with patient's wife at the bedside.  Patient is in and out, lethargic.  He was able to follow simple commands and answer questions.   Discussed about CODE STATUS, patient agreed to no CPR because he is very sick.  Patient tells me that he believes he is very sick and not sure how he is going to improve.   Patient's wife was asking appropriate questions about his prognosis, rehab potentials and any chance of improvement.  Given patient's already compromised liver, recent use of alcohol and profound debility now with bacteremia his chances of improvement is poor.  Agreeable to discuss with palliative care.  If patient not doing good clinical improvement, patient and wife agreeable to involve hospice care.      DVT prophylaxis: Place and maintain sequential compression device Start: 02/19/22 1325      Code Status:     Code Status: DNR  Disposition: Unknown outcome.  Status is: Inpatient  The patient is inpatient because: GI bleed, cirrhosis of liver, splenic infarct, hepatic  encephalopathy, status post endoscopy   Family Communication:  Wife at the bedside.  Consultants:  GI Infectious disease. Oncology Dr. Penelope Coop consult  Procedures:  Upper GI endoscopy with biopsy  Antimicrobials:  Cefazolin, rifaximin  Subjective:  Patient seen and examined.  Keeps eyes closed.  Remains mostly silent.  On waking up complains of some back pain.  Does not involve in conversation but answers simple questions.  Wife at the bedside.  Objective: Vitals:   02/20/22 2002 02/21/22 0511 02/21/22 1013 02/21/22 1216  BP: 137/62 (!) 143/56 (!) 135/50 (!) 119/47  Pulse: 86 93 96 89  Resp: (!) 22 18 (!) 26 (!) 28  Temp: 98.8 F (37.1 C) 99.6 F (37.6 C) 99.6 F (37.6 C) (!) 100.6 F (38.1 C)  TempSrc:  Oral Oral Oral  SpO2: 96% 93% 95% 94%  Weight:      Height:        Intake/Output Summary (Last 24 hours) at 02/21/2022 1253 Last data filed at 02/21/2022 0900 Gross per 24 hour  Intake 118 ml  Output 1700 ml  Net -1582 ml    Filed Weights   02/17/22 2103 02/18/22 0500 02/19/22 0500  Weight: 97.5 kg 98 kg 99.6 kg    Physical Examination: Body mass  index is 34.39 kg/m.   General: Sick looking.  Frail debilitated.  Icteric. Cardiovascular: S1-S2 normal.  Tachycardic. Respiratory: Bilateral clear with some upper airway sounds.  No other added sounds. Gastrointestinal: Soft.  Nontender.  Distended.  Bowel sound present.  He has plenty of diarrhea with maroon-colored stool. Ext: 1+ edema all extremities.  No cyanosis.  No clubbing. Neuro: Mostly sleepy.  Wakes up on conversation.  Oriented to himself. Musculoskeletal: No deformities. Skin: Intact.  Data Reviewed:   CBC: Recent Labs  Lab 02/16/22 1700 02/17/22 0141 02/18/22 0528 02/18/22 1239 02/19/22 0606 02/19/22 1446 02/19/22 1705 02/20/22 0553 02/21/22 0505  WBC 18.5* 16.3* 20.7*  --  19.5*  --   --  14.0* 11.8*  NEUTROABS 15.6*  --   --   --   --   --   --   --   --   HGB 12.4* 11.4*  9.3*   < > 8.7* 10.4* 11.3* 8.7* 7.2*  HCT 36.9* 35.0* 28.7*   < > 26.4* 31.4* 35.4* 26.4* 22.6*  MCV 98.4 100.9* 102.9*  --  100.0  --   --  100.8* 103.7*  PLT 148* 135* 114*  --  117*  --   --  101* 83*   < > = values in this interval not displayed.     Basic Metabolic Panel: Recent Labs  Lab 02/17/22 0141 02/18/22 0528 02/19/22 0606 02/20/22 0553 02/21/22 0505  NA 133* 135 137 145 145  K 4.2 4.2 3.9 3.8 3.9  CL 104 110 112* 117* 117*  CO2 20* 18* 18* 20* 21*  GLUCOSE 101* 125* 123* 118* 121*  BUN 85* 107* 91* 83* 61*  CREATININE 2.22* 2.19* 1.83* 1.48* 1.26*  CALCIUM 7.7* 7.7* 8.0* 8.7* 8.9  MG 2.7* 2.7* 3.0* 2.6* 2.5*     Liver Function Tests: Recent Labs  Lab 02/16/22 1700 02/17/22 0141 02/18/22 0528 02/20/22 0553 02/21/22 0505  AST 82* 77* 56* 42* 41  ALT 40 43 32 15 14  ALKPHOS 156* 134* 112 104 86  BILITOT 8.0* 7.0* 5.6* 5.2* 5.4*  PROT 7.2 6.5 5.7* 6.7 6.8  ALBUMIN 2.6* 1.8* 1.6* 2.9* 3.6      Radiology Studies: ECHOCARDIOGRAM COMPLETE  Result Date: 02/19/2022    ECHOCARDIOGRAM REPORT   Patient Name:   MAGDALENO LORTIE Date of Exam: 02/19/2022 Medical Rec #:  [id number]        Height:       67.0 in Accession #:    [id number]       Weight:       219.6 lb Date of Birth:  1956-04-09       BSA:          2.104 m Patient Age:    31 years         BP:           134/53 mmHg Patient Gender: M                HR:           71 bpm. Exam Location:  Inpatient Procedure: 2D Echo Indications:    Bacteremia  History:        Patient has prior history of Echocardiogram examinations, most                 recent 04/28/2017. Risk Factors:Hypertension.  Sonographer:    Harvie Junior Referring Phys: [id number] [place]  Sonographer Comments: Technically difficult study due to poor echo windows. Image  acquisition challenging due to patient body habitus. IMPRESSIONS  1. Left ventricular ejection fraction, by estimation, is 70 to 75%. The left ventricle has hyperdynamic function. The  left ventricle has no regional wall motion abnormalities. Left ventricular diastolic parameters were normal.  2. Right ventricular systolic function is normal. The right ventricular size is normal. There is normal pulmonary artery systolic pressure.  3. The mitral valve is normal in structure. No evidence of mitral valve regurgitation. No evidence of mitral stenosis.  4. The aortic valve is tricuspid. There is moderate calcification of the aortic valve. There is moderate thickening of the aortic valve. Aortic valve regurgitation is not visualized. No aortic stenosis is present.  5. The inferior vena cava is normal in size with greater than 50% respiratory variability, suggesting right atrial pressure of 3 mmHg. FINDINGS  Left Ventricle: Left ventricular ejection fraction, by estimation, is 70 to 75%. The left ventricle has hyperdynamic function. The left ventricle has no regional wall motion abnormalities. The left ventricular internal cavity size was normal in size. There is no left ventricular hypertrophy. Left ventricular diastolic parameters were normal. Right Ventricle: The right ventricular size is normal. Right vetricular wall thickness was not well visualized. Right ventricular systolic function is normal. There is normal pulmonary artery systolic pressure. The tricuspid regurgitant velocity is 2.38 m/s, and with an assumed right atrial pressure of 3 mmHg, the estimated right ventricular systolic pressure is 54.0 mmHg. Left Atrium: Left atrial size was normal in size. Right Atrium: Right atrial size was normal in size. Pericardium: There is no evidence of pericardial effusion. Mitral Valve: The mitral valve is normal in structure. Mild mitral annular calcification. No evidence of mitral valve regurgitation. No evidence of mitral valve stenosis. Tricuspid Valve: The tricuspid valve is normal in structure. Tricuspid valve regurgitation is trivial. No evidence of tricuspid stenosis. Aortic Valve: The aortic  valve is tricuspid. There is moderate calcification of the aortic valve. There is moderate thickening of the aortic valve. There is moderate aortic valve annular calcification. Aortic valve regurgitation is not visualized. No aortic stenosis is present. Aortic valve mean gradient measures 7.0 mmHg. Aortic valve peak gradient measures 12.1 mmHg. Aortic valve area, by VTI measures 2.26 cm. Pulmonic Valve: The pulmonic valve was not well visualized. Pulmonic valve regurgitation is not visualized. No evidence of pulmonic stenosis. Aorta: The aortic root is normal in size and structure. Venous: The inferior vena cava is normal in size with greater than 50% respiratory variability, suggesting right atrial pressure of 3 mmHg. IAS/Shunts: No atrial level shunt detected by color flow Doppler.  LEFT VENTRICLE PLAX 2D LVIDd:         4.40 cm      Diastology LVIDs:         2.30 cm      LV e' medial:    8.81 cm/s LV PW:         0.90 cm      LV E/e' medial:  13.1 LV IVS:        0.90 cm      LV e' lateral:   11.40 cm/s LVOT diam:     2.10 cm      LV E/e' lateral: 10.1 LV SV:         80 LV SV Index:   38 LVOT Area:     3.46 cm  LV Volumes (MOD) LV vol d, MOD A2C: 119.0 ml LV vol d, MOD A4C: 147.0 ml LV vol s, MOD A2C: 43.1 ml  LV vol s, MOD A4C: 55.7 ml LV SV MOD A2C:     75.9 ml LV SV MOD A4C:     147.0 ml LV SV MOD BP:      82.3 ml RIGHT VENTRICLE RV Basal diam:  3.60 cm RV Mid diam:    3.20 cm RV S prime:     12.40 cm/s TAPSE (M-mode): 2.2 cm LEFT ATRIUM             Index        RIGHT ATRIUM           Index LA diam:        3.60 cm 1.71 cm/m   RA Area:     14.00 cm LA Vol (A2C):   58.4 ml 27.75 ml/m  RA Volume:   35.40 ml  16.82 ml/m LA Vol (A4C):   58.0 ml 27.56 ml/m LA Biplane Vol: 63.2 ml 30.04 ml/m  AORTIC VALVE                     PULMONIC VALVE AV Area (Vmax):    2.07 cm      PV Vmax:       1.09 m/s AV Area (Vmean):   2.02 cm      PV Peak grad:  4.8 mmHg AV Area (VTI):     2.26 cm AV Vmax:           174.00 cm/s AV  Vmean:          117.000 cm/s AV VTI:            0.352 m AV Peak Grad:      12.1 mmHg AV Mean Grad:      7.0 mmHg LVOT Vmax:         104.00 cm/s LVOT Vmean:        68.100 cm/s LVOT VTI:          0.230 m LVOT/AV VTI ratio: 0.65  AORTA Ao Root diam: 3.60 cm Ao Asc diam:  3.40 cm MITRAL VALVE                TRICUSPID VALVE MV Area (PHT): 3.31 cm     TR Peak grad:   22.7 mmHg MV Decel Time: 229 msec     TR Vmax:        238.00 cm/s MV E velocity: 115.00 cm/s MV A velocity: 75.40 cm/s   SHUNTS MV E/A ratio:  1.53         Systemic VTI:  0.23 m                             Systemic Diam: 2.10 cm Carlyle Dolly MD Electronically signed by Carlyle Dolly MD Signature Date/Time: 02/19/2022/3:09:15 PM    Final       LOS: 4 days    Barb Merino, MD [facility] Available via Epic secure chat 7am-7pm After these hours, please refer to coverage provider listed on [email or web address] 02/21/2022, 12:53 PM

## 2022-02-22 DIAGNOSIS — Z515 Encounter for palliative care: Secondary | ICD-10-CM | POA: Diagnosis not present

## 2022-02-22 DIAGNOSIS — R7881 Bacteremia: Secondary | ICD-10-CM | POA: Insufficient documentation

## 2022-02-22 DIAGNOSIS — K7682 Hepatic encephalopathy: Secondary | ICD-10-CM | POA: Diagnosis not present

## 2022-02-22 DIAGNOSIS — R531 Weakness: Principal | ICD-10-CM

## 2022-02-22 DIAGNOSIS — Z7189 Other specified counseling: Secondary | ICD-10-CM

## 2022-02-22 DIAGNOSIS — K651 Peritoneal abscess: Secondary | ICD-10-CM | POA: Diagnosis not present

## 2022-02-22 DIAGNOSIS — R7989 Other specified abnormal findings of blood chemistry: Secondary | ICD-10-CM | POA: Diagnosis not present

## 2022-02-22 DIAGNOSIS — I8510 Secondary esophageal varices without bleeding: Secondary | ICD-10-CM | POA: Diagnosis not present

## 2022-02-22 DIAGNOSIS — B957 Other staphylococcus as the cause of diseases classified elsewhere: Secondary | ICD-10-CM

## 2022-02-22 DIAGNOSIS — K766 Portal hypertension: Secondary | ICD-10-CM | POA: Diagnosis not present

## 2022-02-22 DIAGNOSIS — N179 Acute kidney failure, unspecified: Secondary | ICD-10-CM | POA: Diagnosis not present

## 2022-02-22 DIAGNOSIS — D735 Infarction of spleen: Secondary | ICD-10-CM | POA: Diagnosis not present

## 2022-02-22 DIAGNOSIS — K2210 Ulcer of esophagus without bleeding: Secondary | ICD-10-CM | POA: Diagnosis not present

## 2022-02-22 LAB — MAGNESIUM: Magnesium: 2 mg/dL (ref 1.7–2.4)

## 2022-02-22 LAB — COMPREHENSIVE METABOLIC PANEL
ALT: 11 U/L (ref 0–44)
AST: 35 U/L (ref 15–41)
Albumin: 3 g/dL — ABNORMAL LOW (ref 3.5–5.0)
Alkaline Phosphatase: 81 U/L (ref 38–126)
Anion gap: 8 (ref 5–15)
BUN: 46 mg/dL — ABNORMAL HIGH (ref 8–23)
CO2: 20 mmol/L — ABNORMAL LOW (ref 22–32)
Calcium: 8.9 mg/dL (ref 8.9–10.3)
Chloride: 115 mmol/L — ABNORMAL HIGH (ref 98–111)
Creatinine, Ser: 1.18 mg/dL (ref 0.61–1.24)
GFR, Estimated: >60 mL/min (ref >60)
Glucose, Bld: 117 mg/dL — ABNORMAL HIGH (ref 70–99)
Potassium: 3.5 mmol/L (ref 3.5–5.1)
Sodium: 143 mmol/L (ref 135–145)
Total Bilirubin: 5.7 mg/dL — ABNORMAL HIGH (ref 0.3–1.2)
Total Protein: 6.2 g/dL — ABNORMAL LOW (ref 6.5–8.1)

## 2022-02-22 LAB — CBC WITH DIFFERENTIAL/PLATELET
Abs Immature Granulocytes: 0.3 10*3/uL — ABNORMAL HIGH (ref 0.00–0.07)
Basophils Absolute: 0 10*3/uL (ref 0.0–0.1)
Basophils Relative: 0 %
Eosinophils Absolute: 0.3 10*3/uL (ref 0.0–0.5)
Eosinophils Relative: 2 %
HCT: 22.9 % — ABNORMAL LOW (ref 39.0–52.0)
Hemoglobin: 7.3 g/dL — ABNORMAL LOW (ref 13.0–17.0)
Immature Granulocytes: 2 %
Lymphocytes Relative: 7 %
Lymphs Abs: 1 10*3/uL (ref 0.7–4.0)
MCH: 33.8 pg (ref 26.0–34.0)
MCHC: 31.9 g/dL (ref 30.0–36.0)
MCV: 106 fL — ABNORMAL HIGH (ref 80.0–100.0)
Monocytes Absolute: 1.4 10*3/uL — ABNORMAL HIGH (ref 0.1–1.0)
Monocytes Relative: 10 %
Neutro Abs: 11.8 10*3/uL — ABNORMAL HIGH (ref 1.7–7.7)
Neutrophils Relative %: 79 %
Platelets: 89 10*3/uL — ABNORMAL LOW (ref 150–400)
RBC: 2.16 MIL/uL — ABNORMAL LOW (ref 4.22–5.81)
RDW: 19.1 % — ABNORMAL HIGH (ref 11.5–15.5)
WBC: 14.8 10*3/uL — ABNORMAL HIGH (ref 4.0–10.5)
nRBC: 0 % (ref 0.0–0.2)

## 2022-02-22 LAB — AMMONIA: Ammonia: 43 umol/L — ABNORMAL HIGH (ref 9–35)

## 2022-02-22 LAB — PHOSPHORUS: Phosphorus: 2.1 mg/dL — ABNORMAL LOW (ref 2.5–4.6)

## 2022-02-22 MED ORDER — POTASSIUM PHOSPHATES 15 MMOLE/5ML IV SOLN
15.0000 mmol | Freq: Once | INTRAVENOUS | Status: AC
  Administered 2022-02-22: 15 mmol via INTRAVENOUS
  Filled 2022-02-22: qty 5

## 2022-02-22 NOTE — Progress Notes (Signed)
PROGRESS NOTE    PAGE LANCON  IOE:[id number] DOB: Nov 08, 1955 DOA: 02/16/2022 PCP: [name], MD    Brief Narrative:  Marc Ponce is a 66 y.o. male with past medical history significant of steatohepatitis, chronic alcohol use, hypertension, hyperlipidemia, GERD, diverticulosis presented to the hospital with increasing low back pain with incontinence.  Patient did have a history of back pain/strain on February 11, 2022. Patient also complains of associated generalized fatigue and fall.  He does have a history of significant alcohol abuse and had to stop stopped drinking on October 11 due to his pain.  In the lab work showed hyponatremia with sodium of 133 creatinine was elevated at 2.0.  Glucose was 164, albumin 2.6, AST 82, alk phos 156, T. bili 8.  CBC showed leukocytosis at 18.5, hemoglobin stable at 12.4, platelets stable at 148.  TSH and T4 normal.  Lactic acid initially elevated to 3.2 but improved to 2.5 with IV fluids.  Ammonia was elevated at 86.  Urinalysis with trace hemoglobin only.   X-ray of the left knee showed no acute abnormality, CT head showed no acute abnormality.  CT of the chest abdomen pelvis showed small bilateral pleural effusions with atelectasis, cirrhosis with portal hypertension and splenomegaly with small ascites.  Also noted was moderate splenic infarct.   Magnesium level mildly elevated at 2.8.  Patient received cefepime, Flagyl, Toradol, morphine, lactulose, heparin, IV fluids, B1 in the ED and was considered for admission to the hospital for further evaluation and treatment.  During hospitalization, patient was seen by GI and underwent endoscopic evaluation on 02/19/2022 with oozing from  portal gastropathy. Heparin drip was discontinued.  ID has seen the patient due to bacteremia and MRI of the thoracic and lumbar spine shows L4-L5 right facet effusion with some periarticular fluid collection thought to be septic facet arthritis around 13 mm.  1 cm  collection in the right quadratus lumborum.  Nonsurgical at this time.  On IV cefazolin..  2D echocardiogram with LV ejection fraction of 70 to 75% with no regional wall motion abnormality.   Patient remained in very poor clinical status.     Assessment and plan.  Principal Problem:   Splenic infarction Active Problems:   Alcohol abuse, daily use   HTN (hypertension)   Hyperlipidemia   Elevated LFTs   GERD (gastroesophageal reflux disease)   Cirrhosis (HCC)   Leukocytosis   AKI (acute kidney injury) ([name])   Splenic infarct   Thrombocytopenia (HCC)   Melena   Acute blood loss anemia   Secondary esophageal varices without bleeding (HCC)   Portal hypertensive gastropathy ([name])   Ulcer of esophagus without bleeding   Acute hepatic encephalopathy ([name])   Splenic infarct Noted to have splenic infarct on CT scan of the abdomen. Due to GI bleed,  Heparin drip was discontinued.  2D echocardiogram did not show any vegetation.  Not a candidate for anticoagulation at this time.  AKI  Likely secondary to NSAID and poor oral intake.  Could not rule out hepatorenal syndrome.  Received albumin with improving creatinine levels..  Creatinine 1.18 <2.1.   Continue to hold  losartan-hydrochlorothiazide.  Continue to monitor renal function.   Alcoholic cirrhosis of liver with melena and bright red blood.   underwent EGD 02/19/2022 with findings of single column of esophageal varices, small esophageal ulcer which was clean-based, mild portal hypertensive gastropathy with contact oozing.  Continue Protonix IV twice daily.  Now with severe debilitated condition and developing hepatic encephalopathy.  Started on rifaximin.  Currently on lactulose and having loose bowel movements. Can use Flexi-Seal. Ammonia levels improving.  Mental status is improving.  Start mobilizing.  Acute blood loss anemia secondary to GI bleed.  Hemodynamically stable.  No need for transfusion.  Latest hemoglobin of 7.3.   Continue with Protonix IV.  We will transfuse for less than 7.  Alcohol use with mild withdrawal. Was a  heavy drinker.  Stopped drinking after February 09, 2022.  No withdrawal symptoms.  Continue CIWA protocol for now.  Continue thiamine, folic acid and multivitamin  Leukocytosis, staph lugdunensis bacteremia with L4-L5 facet septic arthritis. Had recent use of steroids.  Urinalysis negative.  CT scan of abdomen pelvis without any acute source of infection.  Infectious disease on board.  MRI of the lumbar spine showing L4-L5 facet septic arthritis with some collection.  Non-neurosurgical case..  On IV cefazolin.  Repeat blood cultures on 02/19/2022 with no growth so far.  Will likely need  long-term antibiotics on discharge.  ID following.  GERD Continue IV PPI   Hypertension Continue to hold losartan and HCTZ.  Continue metoprolol.  Goal of care discussion:  Patient some clinical improvement today.   DNR/DNI discussed and documented on 10/23.   Start mobilizing, anticipate home with home health therapies if adequate improvement.   Palliative to meet with family today.      DVT prophylaxis: Place and maintain sequential compression device Start: 02/19/22 1325      Code Status:     Code Status: DNR  Disposition: Possible home with home health PT OT, palliative care  Status is: Inpatient  The patient is inpatient because: GI bleed, cirrhosis of liver, splenic infarct, hepatic encephalopathy, status post endoscopy   Family Communication:  Wife at the bedside.  Consultants:  GI Infectious disease. Oncology Dr. Rica Records  Procedures:  Upper GI endoscopy with biopsy  Antimicrobials:  Cefazolin, rifaximin  Subjective:  Patient seen and examined.  Surprisingly he is more awake alert oriented and interactive today.  He is asking me about his kidney functions.  Patient wishes he can walk to the bathroom instead of having to have rectal tube.  He wants to get out of the  bed and walk.  Wife at the bedside.  Denies any nausea vomiting or abdominal pain or distention.  Low-grade fever overnight.  Multiple bowel movements with improvement of ammonia levels.  Objective: Vitals:   02/21/22 1839 02/21/22 2234 02/22/22 0000 02/22/22 0602  BP: (!) 140/50 (!) 136/58  (!) 148/60  Pulse: 93 85  85  Resp: (!) _0 Temp: 99.5 F (37.5 C) (!) 100.5 F (38.1 C) 99.2 F (37.3 C) 98 F (36.7 C)  TempSrc: Oral  Oral   SpO2: 97% 95%  96%  Weight:      Height:        Intake/Output Summary (Last 24 hours) at 02/22/2022 1259 Last data filed at 02/22/2022 0900 Gross per 24 hour  Intake 1198 ml  Output 1900 ml  Net -702 ml   Filed Weights   02/17/22 2103 02/18/22 0500 02/19/22 0500  Weight: 97.5 kg 98 kg 99.6 kg    Physical Examination: Body mass index is 34.39 kg/m.   General: Sick looking.  Frail .  Icteric. Cardiovascular: S1-S2 normal.  Tachycardic. Respiratory: Bilateral clear with some upper airway sounds.  No other added sounds. Gastrointestinal: Soft.  Nontender.  Distended.  Bowel sound present.  Large amount of loose stool in the rectal tube.  Ext: 1+ edema all extremities.  No cyanosis.  No clubbing. Neuro: Alert awake oriented.  Moves all extremities.  No asterixis.  No tremors. Musculoskeletal: No deformities. Skin: Intact.  Data Reviewed:   CBC: Recent Labs  Lab 02/16/22 1700 02/17/22 0141 02/18/22 0528 02/18/22 1239 02/19/22 0606 02/19/22 1446 02/19/22 1705 02/20/22 0553 02/21/22 0505 02/22/22 0505  WBC 18.5*   < > 20.7*  --  19.5*  --   --  14.0* 11.8* 14.8*  NEUTROABS 15.6*  --   --   --   --   --   --   --   --  11.8*  HGB 12.4*   < > 9.3*   < > 8.7* 10.4* 11.3* 8.7* 7.2* 7.3*  HCT 36.9*   < > 28.7*   < > 26.4* 31.4* 35.4* 26.4* 22.6* 22.9*  MCV 98.4   < > 102.9*  --  100.0  --   --  100.8* 103.7* 106.0*  PLT 148*   < > 114*  --  117*  --   --  101* 83* 89*   < > = values in this interval not displayed.    Basic  Metabolic Panel: Recent Labs  Lab 02/18/22 0528 02/19/22 0606 02/20/22 0553 02/21/22 0505 02/22/22 0505  NA 135 137 145 145 143  K 4.2 3.9 3.8 3.9 3.5  CL 110 112* 117* 117* 115*  CO2 18* 18* 20* 21* 20*  GLUCOSE 125* 123* 118* 121* 117*  BUN 107* 91* 83* 61* 46*  CREATININE 2.19* 1.83* 1.48* 1.26* 1.18  CALCIUM 7.7* 8.0* 8.7* 8.9 8.9  MG 2.7* 3.0* 2.6* 2.5* 2.0  PHOS  --   --   --   --  2.1*    Liver Function Tests: Recent Labs  Lab 02/17/22 0141 02/18/22 0528 02/20/22 0553 02/21/22 0505 02/22/22 0505  AST 77* 56* 42* 41 35  ALT 43 32 _0 ALKPHOS 134* 112 104 86 81  BILITOT 7.0* 5.6* 5.2* 5.4* 5.7*  PROT 6.5 5.7* 6.7 6.8 6.2*  ALBUMIN 1.8* 1.6* 2.9* 3.6 3.0*     Radiology Studies: No results found.    LOS: 5 days    Barb Merino, MD [facility] Available via Epic secure chat 7am-7pm After these hours, please refer to coverage provider listed on [email or web address] 02/22/2022, 12:59 PM

## 2022-02-22 NOTE — Consult Note (Signed)
Consultation Note Date: 02/22/2022   Patient Name: Marc Ponce  DOB: 03-02-1956  MRN: [id number]  Age / Sex: 66 y.o., male   PCP: Marc Olp, MD Referring Physician: Barb Merino, MD  Reason for Consultation: Establishing goals of care     Chief Complaint/History of Present Illness:   Patient is a 66 year old male with a past medical history of steatohepatitis, chronic alcohol use, hypertension, hyperlipidemia, GERD, and diverticulosis who was admitted on 02/16/2022 for management of low back pain and incontinence.  During hospitalization patient has had imaging which has shown cirrhosis with portal hypertension and splenomegaly with small ascites and also a moderate splenic infarct.  Patient was seen by GI and underwent endoscopic evaluation on 02/19/2022 and was found to have oozing from portal gastropathy.  Anticoagulation had to be discontinued in the setting.  Patient also found to have bacteremia and so had MRI imaging which showed L4-L5 right facet effusion with some periarticular fluid collection thought to be septic facet arthritis and potential abscess in the right quadratus lumborum.  ID was consulted for recommendations and antibiotic management.  Palliative care consulted to assist with complex medical decision making.  Extensive review of EMR prior to seeing patient.  Presented to bedside and introduced myself and the role of palliative care team in patient's care.  Patient laying comfortably in bed.  Though patient able to awaken and states small words, very sleepy during most of the interaction at bedside.  Patient's wife, Marc Ponce, was present at bedside and most conversation was had with her due to patient's lethargic status.  Patient also agreed to allow his wife to discuss his medical course with this provider.  Marc Ponce was able to update me regarding patient's hospitalization.  She described how patient's state was so bad yesterday that she was "calling  funeral homes".  She describes today as a better day than that and that she no longer feels the urgency of urine or planning.  She also knows that patient is not out of the woods and we are still taking things hour by hour so this is improvement compared to yesterday where was minute to minute.  Today patient is able to recognize her and interact.  She knows that the patient is still sleepy though again his mentation is much improved after his decrease in ammonia today as compared to yesterday.  Acknowledged and expressed gratitude for "good day".  Marc Ponce had multiple questions regarding palliative care and patient's care moving forward.  At [name]'s request, able to discuss the similarities and differences between palliative care and hospice.  Spent extensive time discussing the prognosis and continued progression of severe alcoholic liver disease, especially in a person who has continued to consume alcohol.  Discussed continued ups and downs, setbacks, and potential new baselines.  [name] admitted that she knows " where this will ultimately" though she is unsure of the timeline for that.  We discussed generalities though liver disease can look different and every person.  Discussed hoping that the "Band-Aids" provided with medication can provide patient with more quality time moving forward and time outside of the hospital.  We discussed that his underlying liver disease is not "fixed" by this medication, mainly managed.  [name] voiced appreciation for hearing this.  Marc Ponce noted that patient has been in the bed for 4 days and so she is unsure what even next steps look like.  Discussed involvement of physical therapy and evaluation of patient.  Discussed possibilities of physical therapy recommending  home with physical therapy versus rehab.  Expressed concern that patient may be very debilitated since he has not been out of the bed in 4 days and rehab may be recommended.  Marc Ponce acknowledged patient regaining  strength as much as able as it is primarily her providing patient's care at home.  Though they do have children and grandchildren, she is trying to prepare for being the patient's primary provider.  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 Marc Ponce talked about adopting their home to allow patient to be on the bottom floor and potentially getting DME at home for patient.  Encouraged further discussions with physical therapy regarding this.  At this point, Marc Ponce is hoping for patient's continued improvement of mentation and functional status.  Agreed with this.  With [name]'s permission, also able to discuss generalities about care moving forward.  Discussed that when interventions such as coming to the hospital and medications no longer appear to benefit the patient and improve quality of life, then may need to transition to care to focusing on hospice at home.  Described again differences between home palliative and home hospice with Marc Ponce.  Questions answered at that time.  Noted this provider would continue to follow along during patient's hospitalization to take things day by day and continue to evaluate on what is most appropriate to support the patient and her moving forward.  [name] voiced appreciation for this and extensive conversation regarding prognosis of liver disease.  All questions answered at that time.  Thank patient and Marc Ponce for allowing this provider to visit today.  Primary Diagnoses  Present on Admission:  Cirrhosis ([place])  Alcohol abuse, daily use  HTN (hypertension)  Hyperlipidemia  Elevated LFTs  GERD (gastroesophageal reflux disease)  Splenic infarct   Palliative Review of Systems: Denies any symptoms of concern at this time.  Patient still lethargic though able to awaken and answer questions with short answers. I have reviewed the medical record, interviewed the patient and family, and examined the patient. The following aspects are  pertinent.  Past Medical History:  Diagnosis Date   Allergy    Arthritis    Diverticulitis    GERD (gastroesophageal reflux disease)    History of chicken pox    Hypertension    Peri-rectal abscess    Rectal pain    Social History   Socioeconomic History   Marital status: Married    Spouse name: Not on file   Number of children: 3   Years of education: Not on file   Highest education level: Not on file  Occupational History   Not on file  Tobacco Use   Smoking status: Former    Types: Cigarettes    Quit date: 04/01/1986    Years since quitting: 35.9   Smokeless tobacco: Never  Vaping Use   Vaping Use: Never used  Substance and Sexual Activity   Alcohol use: Yes    Comment: 3-6 per day   Drug use: No   Sexual activity: Yes  Other Topics Concern   Not on file  Social History Narrative   Married. Adopted son of prior GF who died in fire, 2 step daughters.       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 Even in [place] driving to [place].  Hobbies: photography, site seeing, auto racing   Social Determinants of Health   Financial Resource Strain: Not on file  Food Insecurity: No Food Insecurity (02/16/2022)   Hunger Vital Sign    Worried About Running Out of Food in the Last Year: Never true    [place] in the Last Year: Never true  Transportation Needs: No Transportation Needs (02/16/2022)   PRAPARE - Hydrologist (Medical): No    Lack of Transportation (Non-Medical): No  Physical Activity: Not on file  Stress: Not on file  Social Connections: Not on file   Family History  Problem Relation Age of Onset   Dementia Mother    Arthritis Mother    Heart disease Father 23       MI    Hypertension Father    Hyperlipidemia Father    COPD Father    Diabetes Father     Hearing loss Father    Heart attack Father    Kidney disease Father    Heart disease Maternal Grandmother    Arthritis Maternal Grandmother    Heart disease Maternal Grandfather    Lung cancer Maternal Grandfather    Heart disease Paternal Grandmother    Stroke Paternal Grandmother    Leukemia Paternal Grandfather    Colon cancer Neg Hx    Colon polyps Neg Hx    Stomach cancer Neg Hx    Esophageal cancer Neg Hx    Scheduled Meds:  folic acid  1 mg Oral Daily   lactulose  20 g Oral TID   lidocaine  1 patch Transdermal Q24H   metoprolol succinate  50 mg Oral Daily   multivitamin with minerals  1 tablet Oral Daily   pantoprazole (PROTONIX) IV  40 mg Intravenous Q12H   rifaximin  550 mg Oral BID   sodium chloride flush  3 mL Intravenous Q12H   sucralfate  1 g Oral TID WC & HS   thiamine  100 mg Oral Daily   Or   thiamine  100 mg Intravenous Daily   Continuous Infusions:   ceFAZolin (ANCEF) IV 2 g (02/22/22 0106)   PRN Meds:.acetaminophen **OR** acetaminophen, HYDROmorphone (DILAUDID) injection, lip balm, methocarbamol, oxyCODONE, polyethylene glycol, sodium chloride No Known Allergies CBC:    Component Value Date/Time   WBC 14.8 (H) 02/22/2022 0505   HGB 7.3 (L) 02/22/2022 0505   HGB 13.3 04/15/2017 1610   HCT 22.9 (L) 02/22/2022 0505   HCT 38.9 04/15/2017 1610   PLT 89 (L) 02/22/2022 0505   PLT 177 04/15/2017 1610   MCV 106.0 (H) 02/22/2022 0505   MCV 97 04/15/2017 1610   NEUTROABS 11.8 (H) 02/22/2022 0505   NEUTROABS 5.4 04/15/2017 1610   LYMPHSABS 1.0 02/22/2022 0505   LYMPHSABS 1.4 04/15/2017 1610   MONOABS 1.4 (H) 02/22/2022 0505   EOSABS 0.3 02/22/2022 0505   EOSABS 0.2 04/15/2017 1610   BASOSABS 0.0 02/22/2022 0505   BASOSABS 0.0 04/15/2017 1610   Comprehensive Metabolic Panel:    Component Value Date/Time   NA 143 02/22/2022 0505   NA 141 04/15/2017 1610   K 3.5 02/22/2022 0505   CL 115 (H) 02/22/2022 0505   CO2 20 (L) 02/22/2022 0505   BUN 46 (H)  02/22/2022 0505   BUN 15 04/15/2017 1610   CREATININE 1.18 02/22/2022 0505   CREATININE 1.20 03/05/2020 1106   GLUCOSE 117 (H) 02/22/2022 0505   CALCIUM 8.9 02/22/2022 0505   AST 35 02/22/2022 0505  ALT 11 02/22/2022 0505   ALKPHOS 81 02/22/2022 0505   BILITOT 5.7 (H) 02/22/2022 0505   BILITOT 1.0 04/15/2017 1610   PROT 6.2 (L) 02/22/2022 0505   PROT 7.4 04/15/2017 1610   ALBUMIN 3.0 (L) 02/22/2022 0505   ALBUMIN 4.3 04/15/2017 1610    Physical Exam: Vital Signs: BP (!) 148/60 (BP Location: Right Arm)   Pulse 85   Temp 98 F (36.7 C)   Resp 20   Ht 5' 7"  (1.702 m)   Wt 99.6 kg   SpO2 96%   BMI 34.39 kg/m  SpO2: SpO2: 96 % O2 Device: O2 Device: Room Air O2 Flow Rate: O2 Flow Rate (L/min): 5 L/min Intake/output summary:  Intake/Output Summary (Last 24 hours) at 02/22/2022 7793 Last data filed at 02/22/2022 0250 Gross per 24 hour  Intake 956 ml  Output 1300 ml  Net -344 ml   LBM: Last BM Date : 02/22/22 Baseline Weight: Weight: 97.5 kg Most recent weight: Weight: 99.6 kg  General: Laying in bed, lethargic, NAD, chronically ill-appearing Eyes: No discharge noted HENT: Dry mucous membranes Cardiovascular: Tachycardia noted Respiratory: no increase work of breathing noted, not in respiratory distress Abdomen: Rectal tube in place with large amounts of stool Extremities: Edema in all extremities  skin: no rashes or lesions on visible skin Neuro: Lethargic though easily awakens and answers short questions appropriately Psych: Unable to fully assess affect at this time          Palliative Performance Scale: 30%          Additional Data Reviewed: Recent Labs    02/21/22 0505 02/22/22 0505  WBC 11.8* 14.8*  HGB 7.2* 7.3*  PLT 83* 89*  NA 145 143  BUN 61* 46*  CREATININE 1.26* 1.18    Imaging: ECHOCARDIOGRAM COMPLETE    ECHOCARDIOGRAM REPORT       Patient Name:   Marc Ponce Date of Exam: 02/19/2022 Medical Rec #:  [id number]        Height:        67.0 in Accession #:    [id number]       Weight:       219.6 lb Date of Birth:  May 09, 1955       BSA:          2.104 m Patient Age:    22 years         BP:           134/53 mmHg Patient Gender: M                HR:           71 bpm. Exam Location:  Inpatient  Procedure: 2D Echo  Indications:    Bacteremia   History:        Patient has prior history of Echocardiogram examinations, most                 recent 04/28/2017. Risk Factors:Hypertension.   Sonographer:    Harvie Junior Referring Phys: [id number] [place]    Sonographer Comments: Technically difficult study due to poor echo windows. Image acquisition challenging due to patient body habitus. IMPRESSIONS   1. Left ventricular ejection fraction, by estimation, is 70 to 75%. The left ventricle has hyperdynamic function. The left ventricle has no regional wall motion abnormalities. Left ventricular diastolic parameters were normal.  2. Right ventricular systolic function is normal. The right ventricular size is normal. There is normal pulmonary artery systolic pressure.  3.  The mitral valve is normal in structure. No evidence of mitral valve regurgitation. No evidence of mitral stenosis.  4. The aortic valve is tricuspid. There is moderate calcification of the aortic valve. There is moderate thickening of the aortic valve. Aortic valve regurgitation is not visualized. No aortic stenosis is present.  5. The inferior vena cava is normal in size with greater than 50% respiratory variability, suggesting right atrial pressure of 3 mmHg.  FINDINGS  Left Ventricle: Left ventricular ejection fraction, by estimation, is 70 to 75%. The left ventricle has hyperdynamic function. The left ventricle has no regional wall motion abnormalities. The left ventricular internal cavity size was normal in size.  There is no left ventricular hypertrophy. Left ventricular diastolic parameters were normal.  Right Ventricle: The right ventricular size is  normal. Right vetricular wall thickness was not well visualized. Right ventricular systolic function is normal. There is normal pulmonary artery systolic pressure. The tricuspid regurgitant velocity is 2.38  m/s, and with an assumed right atrial pressure of 3 mmHg, the estimated right ventricular systolic pressure is 21.3 mmHg.  Left Atrium: Left atrial size was normal in size.  Right Atrium: Right atrial size was normal in size.  Pericardium: There is no evidence of pericardial effusion.  Mitral Valve: The mitral valve is normal in structure. Mild mitral annular calcification. No evidence of mitral valve regurgitation. No evidence of mitral valve stenosis.  Tricuspid Valve: The tricuspid valve is normal in structure. Tricuspid valve regurgitation is trivial. No evidence of tricuspid stenosis.  Aortic Valve: The aortic valve is tricuspid. There is moderate calcification of the aortic valve. There is moderate thickening of the aortic valve. There is moderate aortic valve annular calcification. Aortic valve regurgitation is not visualized. No  aortic stenosis is present. Aortic valve mean gradient measures 7.0 mmHg. Aortic valve peak gradient measures 12.1 mmHg. Aortic valve area, by VTI measures 2.26 cm.  Pulmonic Valve: The pulmonic valve was not well visualized. Pulmonic valve regurgitation is not visualized. No evidence of pulmonic stenosis.  Aorta: The aortic root is normal in size and structure.  Venous: The inferior vena cava is normal in size with greater than 50% respiratory variability, suggesting right atrial pressure of 3 mmHg.  IAS/Shunts: No atrial level shunt detected by color flow Doppler.    LEFT VENTRICLE PLAX 2D LVIDd:         4.40 cm      Diastology LVIDs:         2.30 cm      LV e' medial:    8.81 cm/s LV PW:         0.90 cm      LV E/e' medial:  13.1 LV IVS:        0.90 cm      LV e' lateral:   11.40 cm/s LVOT diam:     2.10 cm      LV E/e' lateral: 10.1 LV SV:          80 LV SV Index:   38 LVOT Area:     3.46 cm   LV Volumes (MOD) LV vol d, MOD A2C: 119.0 ml LV vol d, MOD A4C: 147.0 ml LV vol s, MOD A2C: 43.1 ml LV vol s, MOD A4C: 55.7 ml LV SV MOD A2C:     75.9 ml LV SV MOD A4C:     147.0 ml LV SV MOD BP:      82.3 ml  RIGHT VENTRICLE RV Basal diam:  3.60  cm RV Mid diam:    3.20 cm RV S prime:     12.40 cm/s TAPSE (M-mode): 2.2 cm  LEFT ATRIUM             Index        RIGHT ATRIUM           Index LA diam:        3.60 cm 1.71 cm/m   RA Area:     14.00 cm LA Vol (A2C):   58.4 ml 27.75 ml/m  RA Volume:   35.40 ml  16.82 ml/m LA Vol (A4C):   58.0 ml 27.56 ml/m LA Biplane Vol: 63.2 ml 30.04 ml/m  AORTIC VALVE                     PULMONIC VALVE AV Area (Vmax):    2.07 cm      PV Vmax:       1.09 m/s AV Area (Vmean):   2.02 cm      PV Peak grad:  4.8 mmHg AV Area (VTI):     2.26 cm AV Vmax:           174.00 cm/s AV Vmean:          117.000 cm/s AV VTI:            0.352 m AV Peak Grad:      12.1 mmHg AV Mean Grad:      7.0 mmHg LVOT Vmax:         104.00 cm/s LVOT Vmean:        68.100 cm/s LVOT VTI:          0.230 m LVOT/AV VTI ratio: 0.65   AORTA Ao Root diam: 3.60 cm Ao Asc diam:  3.40 cm  MITRAL VALVE                TRICUSPID VALVE MV Area (PHT): 3.31 cm     TR Peak grad:   22.7 mmHg MV Decel Time: 229 msec     TR Vmax:        238.00 cm/s MV E velocity: 115.00 cm/s MV A velocity: 75.40 cm/s   SHUNTS MV E/A ratio:  1.53         Systemic VTI:  0.23 m                             Systemic Diam: 2.10 cm  Carlyle Dolly MD Electronically signed by Carlyle Dolly MD Signature Date/Time: 02/19/2022/3:09:15 PM      Final   MR Lumbar Spine W Wo Contrast CLINICAL DATA:  Thoracic and lumbar radiculopathy with infection suspected. Bacteremia  EXAM: MRI THORACIC AND LUMBAR SPINE WITHOUT AND WITH CONTRAST  TECHNIQUE: Multiplanar and multiecho pulse sequences of the thoracic and lumbar spine were obtained without and  with intravenous contrast.  CONTRAST:  60m GADAVIST GADOBUTROL 1 MMOL/ML IV SOLN  COMPARISON:  None Available.  FINDINGS: MRI THORACIC SPINE FINDINGS  Alignment:  Exaggerated thoracic kyphosis.  No listhesis.  Vertebrae: No fracture, evidence of discitis, or bone lesion.  Cord:  Normal appearance when allowing for motion artifact.  Paraspinal and other soft tissues: Trace pleural effusion and dependent atelectasis bilaterally.  Disc levels:  Midthoracic disc space narrowing and spondylitic spurring. No neural impingement  MRI LUMBAR SPINE FINDINGS  Segmentation:  Standard.  Alignment:  Hyperlordosis with mild L5-S1 anterolisthesis  Vertebrae: Right L4-5 facet joint effusion with periarticular  edema and posteriorly dissecting fluid. No associated marrow edema, but still suspicious in this setting.  Conus medullaris: Extends to the L1 level and appears normal.  Paraspinal and other soft tissues: 1 cm fluid collection in the upper right quadratus lumborum where there is muscular expansion and edema  Partially covered splenomegaly, known  Disc levels:  Facet spurring mainly at L5-S1 where there is mild anterolisthesis. At the same level the disc is desiccated and mildly narrow with annular fissure. No degenerative impingement  IMPRESSION: Thoracic MRI:  No evidence of infection.  Lumbar MRI:  1. L4-5 right facet effusion with posterior dissecting periarticular fluid collection, presumed septic facet arthritis in this clinical setting. The collection measures up to 13 mm. 2. 1 cm collection in the upper right quadratus lumborum, presumably intramuscular seeding and abscess.  Electronically Signed   By: Jorje Guild M.D.   On: 02/19/2022 12:17 MR THORACIC SPINE W WO CONTRAST CLINICAL DATA:  Thoracic and lumbar radiculopathy with infection suspected. Bacteremia  EXAM: MRI THORACIC AND LUMBAR SPINE WITHOUT AND WITH CONTRAST  TECHNIQUE: Multiplanar  and multiecho pulse sequences of the thoracic and lumbar spine were obtained without and with intravenous contrast.  CONTRAST:  63m GADAVIST GADOBUTROL 1 MMOL/ML IV SOLN  COMPARISON:  None Available.  FINDINGS: MRI THORACIC SPINE FINDINGS  Alignment:  Exaggerated thoracic kyphosis.  No listhesis.  Vertebrae: No fracture, evidence of discitis, or bone lesion.  Cord:  Normal appearance when allowing for motion artifact.  Paraspinal and other soft tissues: Trace pleural effusion and dependent atelectasis bilaterally.  Disc levels:  Midthoracic disc space narrowing and spondylitic spurring. No neural impingement  MRI LUMBAR SPINE FINDINGS  Segmentation:  Standard.  Alignment:  Hyperlordosis with mild L5-S1 anterolisthesis  Vertebrae: Right L4-5 facet joint effusion with periarticular edema and posteriorly dissecting fluid. No associated marrow edema, but still suspicious in this setting.  Conus medullaris: Extends to the L1 level and appears normal.  Paraspinal and other soft tissues: 1 cm fluid collection in the upper right quadratus lumborum where there is muscular expansion and edema  Partially covered splenomegaly, known  Disc levels:  Facet spurring mainly at L5-S1 where there is mild anterolisthesis. At the same level the disc is desiccated and mildly narrow with annular fissure. No degenerative impingement  IMPRESSION: Thoracic MRI:  No evidence of infection.  Lumbar MRI:  1. L4-5 right facet effusion with posterior dissecting periarticular fluid collection, presumed septic facet arthritis in this clinical setting. The collection measures up to 13 mm. 2. 1 cm collection in the upper right quadratus lumborum, presumably intramuscular seeding and abscess.  Electronically Signed   By: JJorje GuildM.D.   On: 02/19/2022 12:17    I have personally reviewed recent imaging.   Palliative Care Assessment and Plan Summary of Established Goals of Care  and Medical Treatment Preferences   Patient is a 66year old male with a past medical history of steatohepatitis, chronic alcohol use, hypertension, hyperlipidemia, GERD, and diverticulosis who was admitted on 02/16/2022 for management of low back pain and incontinence.  During hospitalization patient has had imaging which has shown cirrhosis with portal hypertension and splenomegaly with small ascites and also a moderate splenic infarct.  Patient was seen by GI and underwent endoscopic evaluation on 02/19/2022 and was found to have oozing from portal gastropathy.  Anticoagulation had to be discontinued in the setting.  Patient also found to have bacteremia and so had MRI imaging which showed L4-L5 right facet effusion with some periarticular fluid collection  thought to be septic facet arthritis and potential abscess in the right quadratus lumborum.  ID was consulted for recommendations and antibiotic management.  Palliative care consulted to assist with complex medical decision making.  # Complex medical decision making/goals of care  -Patient able to easily awaken today to answer short questions though still very sleepy when seen at bedside.  Most of the discussion today was had with patient's wife, Marc Ponce, at bedside.   -Spent extensive time with Marc Ponce describing overall trajectory regarding decompensated alcoholic cirrhosis associated with esophageal varices, portal hypertension, and encephalopathy in the setting of continued alcohol use.  Discussed how medications currently being used to manage cirrhosis are "Band-Aids" and do not fix the underlying problem of liver disease.  GI note on 02/20/2022 noted MELD score of 29 at that time and Child-Pugh score C.  This underlying liver disease is also now in the setting of bacteremia requiring 4-6 weeks of IV antibiotics as per ID recommendations.  Expressed concern could be further "setbacks", especially patient's currently debilitated state. -While Marc Ponce is  currently hoping for continued "improvement" she wants to discuss all avenues of support moving forward.  Discussed similarities and differences between palliative care and hospice care at home.  Commended continued conversations about what is most important to the patient regarding his quality of life and time moving forward.  -  Code Status: DNR   # Psycho-social/Spiritual Support:  - Support System: Wife -Marc Ponce, children, grandchildren  # Discharge Planning: Await PTs further evaluation and recommendations; may need rehab due to deconditioned state  Thank you for allowing the palliative care team to participate in the care Marc Ponce.  Chelsea Aus, DO Palliative Care Provider PMT # [phone]  This provider spent a total of 82 minutes providing patient's care.  Includes review of EMR, discussing care with other staff members involved in patient's medical care, obtaining relevant history and information from patient and/or patient's family, and personal review of imaging and lab work. Greater than 50% of the time was spent counseling and coordinating care related to the above assessment and plan.

## 2022-02-22 NOTE — Progress Notes (Signed)
[facility]  Date of Admission:  02/16/2022      Abx: 10/19-c cefazolin  10/21-c rifaximin  10/18-19 cefepime/flagyl  Impression: Staph lugdenensis bacteremia Septic l4-5 arthritis and quadratus lumborum small abscess (1cm). Tte no obvious vegetation Splenic infarct suspect septic  EtoH cirrhosis with esophageal varices and PHG; decompensated  Left knee arthroplasty and left shoulder screw/prior surgery   10/18 bcx positive 10/21 bcx negative  Aki from sepsis improving Hepatic encephalopathy also better on abx and rifaximine/lactulose  Thrombocytopenia ?sepsis related and cirrhosis. too early for cefazolin  Given quadratus lumborum involvement, this is considered the equivalent/corollary of vertebral osteomyelitis. Treatment will need to be at least 6 weeks Splenic infarct early to say at this point but could turn in to abscess  As he had cleared his bacteremia, I do not see need to push for tee at this time  He has no new pain in his shoulders (bilateral chronic and had some left shoulder steroid injection 1 week prior to this admission) or his left knee   Plan: -continue cefazolin -from id standpoint, I would think he'll need to be monitored through this week and have repeat imaging abdomen next week to reassess the quadratus lumborum abscess and the spleen. -if things continue to look better next week I would consider transitioning him to a long course of oral antibiotics and see in id clinic for follow up -hope his liver function holds up -discussed plan with him and his wife -discussed with primary team   I spent 50 minute reviewing data/chart, and coordinating care and >50% direct face to face time providing counseling/discussing diagnostics/treatment plan with patient    Principal Problem:   Splenic infarction Active Problems:   Alcohol abuse, daily use   HTN (hypertension)   Hyperlipidemia   Elevated LFTs   GERD  (gastroesophageal reflux disease)   Cirrhosis (HCC)   Leukocytosis   AKI (acute kidney injury) ([place])   Splenic infarct   Thrombocytopenia (HCC)   Melena   Acute blood loss anemia   Secondary esophageal varices without bleeding ([place])   Portal hypertensive gastropathy ([place])   Ulcer of esophagus without bleeding   Acute hepatic encephalopathy (HCC)   No Known Allergies  Scheduled Meds:  folic acid  1 mg Oral Daily   lactulose  20 g Oral TID   lidocaine  1 patch Transdermal Q24H   metoprolol succinate  50 mg Oral Daily   multivitamin with minerals  1 tablet Oral Daily   pantoprazole (PROTONIX) IV  40 mg Intravenous Q12H   rifaximin  550 mg Oral BID   sodium chloride flush  3 mL Intravenous Q12H   sucralfate  1 g Oral TID WC & HS   thiamine  100 mg Oral Daily   Or   thiamine  100 mg Intravenous Daily   Continuous Infusions:   ceFAZolin (ANCEF) IV 2 g (02/22/22 1008)   potassium PHOSPHATE IVPB (in mmol) 15 mmol (02/22/22 1322)   PRN Meds:.acetaminophen **OR** acetaminophen, HYDROmorphone (DILAUDID) injection, lip balm, methocarbamol, oxyCODONE, polyethylene glycol, sodium chloride   SUBJECTIVE: Still mentating slowly but much improved since admission. No focal pain Stable bilateral shoulder pain No left knee pain No back pain  He had hx sciatica  Review of Systems: ROS All other ROS was negative, except mentioned above     OBJECTIVE: Vitals:   02/21/22 1839 02/21/22 2234 02/22/22 0000 02/22/22 0602  BP: (!) 140/50 (!) 136/58  Marland Kitchen)  148/60  Pulse: 93 85  85  Resp: (!) 24 20  20   Temp: 99.5 F (37.5 C) (!) 100.5 F (38.1 C) 99.2 F (37.3 C) 98 F (36.7 C)  TempSrc: Oral  Oral   SpO2: 97% 95%  96%  Weight:      Height:       Body mass index is 34.39 kg/m.  Physical Exam  General/constitutional: jaundice, appear chronically ill but no acute distress; conversant; appropriate HEENT: Normocephalic, PER, EOMI, Oropharynx clear CV: rrr no mrg Lungs: clear to  auscultation, normal respiratory effort Abd: Soft, Nontender; no fluid wave Ext: no edema Skin: No Rash Neuro: nonfocal; mentating slowly but no asterixis MSK: intact active and passive rom bilateral shoulder/knees without tenderness; no peripheral joint effusion/warmth   Rectal tube visualized; external foley visualized   Lab Results Lab Results  Component Value Date   WBC 14.8 (H) 02/22/2022   HGB 7.3 (L) 02/22/2022   HCT 22.9 (L) 02/22/2022   MCV 106.0 (H) 02/22/2022   PLT 89 (L) 02/22/2022    Lab Results  Component Value Date   CREATININE 1.18 02/22/2022   BUN 46 (H) 02/22/2022   NA 143 02/22/2022   K 3.5 02/22/2022   CL 115 (H) 02/22/2022   CO2 20 (L) 02/22/2022    Lab Results  Component Value Date   ALT 11 02/22/2022   AST 35 02/22/2022   ALKPHOS 81 02/22/2022   BILITOT 5.7 (H) 02/22/2022      Microbiology: Recent Results (from the past 240 hour(s))  Blood culture (routine x 2)     Status: Abnormal   Collection Time: 02/16/22  5:39 PM   Specimen: BLOOD  Result Value Ref Range Status   Specimen Description   Final    BLOOD RIGHT ANTECUBITAL Performed at [facility], [address], [place], [place] [address]    Special Requests   Final    Blood Culture adequate volume BOTTLES DRAWN AEROBIC ONLY Performed at [facility], [place], [place] [address]    Culture  Setup Time   Final    GRAM POSITIVE COCCI IN CLUSTERS AEROBIC BOTTLE ONLY    Culture (A)  Final    STAPHYLOCOCCUS LUGDUNENSIS SUSCEPTIBILITIES PERFORMED ON PREVIOUS CULTURE WITHIN THE LAST 5 DAYS. Performed at [facility], [place] [address] [place], [place] [address]    Report Status 02/19/2022 FINAL  Final  Blood culture (routine x 2)     Status: Abnormal   Collection Time: 02/16/22  5:39 PM   Specimen: BLOOD  Result Value Ref Range Status   Specimen Description   Final    BLOOD LEFT ANTECUBITAL Performed at [facility], [address], [place], [place] [address]    Special Requests   Final    Blood Culture adequate volume BOTTLES DRAWN AEROBIC AND ANAEROBIC Performed at [facility], [place], [place] [address]    Culture  Setup Time   Final    GRAM POSITIVE COCCI IN CLUSTERS ANAEROBIC BOTTLE ONLY CRITICAL RESULT CALLED TO, READ BACK BY AND VERIFIED WITH: PHARMD MICHELLE BELL ON 02/17/22 @ 1606 BY DRT Performed at [facility], [place] [address] [place], [place] [address]    Culture STAPHYLOCOCCUS LUGDUNENSIS (A)  Final   Report Status 02/19/2022 FINAL  Final   Organism ID, Bacteria STAPHYLOCOCCUS LUGDUNENSIS  Final      Susceptibility   Staphylococcus lugdunensis - MIC*    CIPROFLOXACIN <=0.5 SENSITIVE  Sensitive     ERYTHROMYCIN <=0.25 SENSITIVE Sensitive     GENTAMICIN <=0.5 SENSITIVE Sensitive     OXACILLIN 0.5 SENSITIVE Sensitive     TETRACYCLINE <=1 SENSITIVE Sensitive     VANCOMYCIN <=0.5 SENSITIVE Sensitive     TRIMETH/SULFA <=10 SENSITIVE Sensitive     CLINDAMYCIN <=0.25 SENSITIVE Sensitive     RIFAMPIN <=0.5 SENSITIVE Sensitive     Inducible Clindamycin NEGATIVE Sensitive     * STAPHYLOCOCCUS LUGDUNENSIS  Blood Culture ID Panel (Reflexed)     Status: Abnormal   Collection Time: 02/16/22  5:39 PM  Result Value Ref Range Status   Enterococcus faecalis NOT DETECTED NOT DETECTED Final   Enterococcus Faecium NOT DETECTED NOT DETECTED Final   Listeria monocytogenes NOT DETECTED NOT DETECTED Final   Staphylococcus species DETECTED (A) NOT DETECTED Final    Comment: CRITICAL RESULT CALLED TO, READ BACK BY AND VERIFIED WITH: PHARMD MICHELLE BELL ON 02/17/22 @ 1606 BY DRT    Staphylococcus aureus (BCID) NOT DETECTED NOT DETECTED Final   Staphylococcus epidermidis NOT DETECTED NOT DETECTED Final   Staphylococcus lugdunensis DETECTED (A) NOT DETECTED Final    Comment: CRITICAL RESULT CALLED TO, READ BACK BY AND VERIFIED WITH: PHARMD  MICHELLE BELL ON 02/17/22 @ 1606 BY DRT    Streptococcus species NOT DETECTED NOT DETECTED Final   Streptococcus agalactiae NOT DETECTED NOT DETECTED Final   Streptococcus pneumoniae NOT DETECTED NOT DETECTED Final   Streptococcus pyogenes NOT DETECTED NOT DETECTED Final   A.calcoaceticus-baumannii NOT DETECTED NOT DETECTED Final   Bacteroides fragilis NOT DETECTED NOT DETECTED Final   Enterobacterales NOT DETECTED NOT DETECTED Final   Enterobacter cloacae complex NOT DETECTED NOT DETECTED Final   Escherichia coli NOT DETECTED NOT DETECTED Final   Klebsiella aerogenes NOT DETECTED NOT DETECTED Final   Klebsiella oxytoca NOT DETECTED NOT DETECTED Final   Klebsiella pneumoniae NOT DETECTED NOT DETECTED Final   Proteus species NOT DETECTED NOT DETECTED Final   Salmonella species NOT DETECTED NOT DETECTED Final   Serratia marcescens NOT DETECTED NOT DETECTED Final   Haemophilus influenzae NOT DETECTED NOT DETECTED Final   Neisseria meningitidis NOT DETECTED NOT DETECTED Final   Pseudomonas aeruginosa NOT DETECTED NOT DETECTED Final   Stenotrophomonas maltophilia NOT DETECTED NOT DETECTED Final   Candida albicans NOT DETECTED NOT DETECTED Final   Candida auris NOT DETECTED NOT DETECTED Final   Candida glabrata NOT DETECTED NOT DETECTED Final   Candida krusei NOT DETECTED NOT DETECTED Final   Candida parapsilosis NOT DETECTED NOT DETECTED Final   Candida tropicalis NOT DETECTED NOT DETECTED Final   Cryptococcus neoformans/gattii NOT DETECTED NOT DETECTED Final   Methicillin resistance mecA/C NOT DETECTED NOT DETECTED Final    Comment: Performed at [facility], [place] [address] [place], [place] [address]  Culture, blood (Routine X 2) w Reflex to ID Panel     Status: None (Preliminary result)   Collection Time: 02/19/22 12:26 AM   Specimen: BLOOD RIGHT HAND  Result Value Ref Range Status   Specimen Description   Final    BLOOD RIGHT HAND Performed at [facility] [address] [place], [place] [address]    Special Requests   Final    BOTTLES DRAWN AEROBIC ONLY BCLV Performed at [facility] [address], [place], [place] [address]    Culture   Final    NO GROWTH 3 DAYS Performed at [facility], [place] [address], [place],  [address]  Report Status PENDING  Incomplete  Culture, blood (Routine X 2) w Reflex to ID Panel     Status: None (Preliminary result)   Collection Time: 02/19/22  6:06 AM   Specimen: BLOOD  Result Value Ref Range Status   Specimen Description   Final    BLOOD BLOOD RIGHT HAND Performed at [facility] [address], [place], [place] [address]    Special Requests   Final    BOTTLES DRAWN AEROBIC ONLY Blood Culture adequate volume Performed at [facility] [address], [place], [place] [address]    Culture   Final    NO GROWTH 3 DAYS Performed at [facility], [place] [address] [place], [place] [address]    Report Status PENDING  Incomplete     Serology:   Imaging: If present, new imagings (plain films, ct scans, and mri) have been personally visualized and interpreted; radiology reports have been reviewed. Decision making incorporated into the Impression / Recommendations.  10/21 tte  1. Left ventricular ejection fraction, by estimation, is 70 to 75%. The  left ventricle has hyperdynamic function. The left ventricle has no  regional wall motion abnormalities. Left ventricular diastolic parameters  were normal.   2. Right ventricular systolic function is normal. The right ventricular  size is normal. There is normal pulmonary artery systolic pressure.   3. The mitral valve is normal in structure. No evidence of mitral valve  regurgitation. No evidence of mitral stenosis.   4. The aortic valve is tricuspid. There is moderate calcification of the  aortic valve. There is moderate thickening of the aortic valve. Aortic  valve  regurgitation is not visualized. No aortic stenosis is present.   5. The inferior vena cava is normal in size with greater than 50%  respiratory variability, suggesting right atrial pressure of 3 mmHg.    10/21 thoracic lumbar mri IMPRESSION: Thoracic MRI:   No evidence of infection.   Lumbar MRI:   1. L4-5 right facet effusion with posterior dissecting periarticular fluid collection, presumed septic facet arthritis in this clinical setting. The collection measures up to 13 mm. 2. 1 cm collection in the upper right quadratus lumborum, presumably intramuscular seeding and abscess.    Jabier Mutton, [facility] [place] [phone] pager    02/22/2022, 2:11 PM

## 2022-02-23 DIAGNOSIS — R531 Weakness: Secondary | ICD-10-CM | POA: Diagnosis not present

## 2022-02-23 DIAGNOSIS — K7460 Unspecified cirrhosis of liver: Secondary | ICD-10-CM

## 2022-02-23 DIAGNOSIS — R188 Other ascites: Secondary | ICD-10-CM | POA: Diagnosis not present

## 2022-02-23 DIAGNOSIS — R7881 Bacteremia: Secondary | ICD-10-CM | POA: Diagnosis not present

## 2022-02-23 DIAGNOSIS — Z515 Encounter for palliative care: Secondary | ICD-10-CM

## 2022-02-23 DIAGNOSIS — Z7189 Other specified counseling: Secondary | ICD-10-CM

## 2022-02-23 DIAGNOSIS — K2210 Ulcer of esophagus without bleeding: Secondary | ICD-10-CM

## 2022-02-23 DIAGNOSIS — D735 Infarction of spleen: Secondary | ICD-10-CM | POA: Diagnosis not present

## 2022-02-23 LAB — CBC WITH DIFFERENTIAL/PLATELET
Abs Immature Granulocytes: 0.28 10*3/uL — ABNORMAL HIGH (ref 0.00–0.07)
Basophils Absolute: 0.1 10*3/uL (ref 0.0–0.1)
Basophils Relative: 0 %
Eosinophils Absolute: 0.3 10*3/uL (ref 0.0–0.5)
Eosinophils Relative: 2 %
HCT: 22.5 % — ABNORMAL LOW (ref 39.0–52.0)
Hemoglobin: 7.2 g/dL — ABNORMAL LOW (ref 13.0–17.0)
Immature Granulocytes: 2 %
Lymphocytes Relative: 8 %
Lymphs Abs: 1.2 10*3/uL (ref 0.7–4.0)
MCH: 33.6 pg (ref 26.0–34.0)
MCHC: 32 g/dL (ref 30.0–36.0)
MCV: 105.1 fL — ABNORMAL HIGH (ref 80.0–100.0)
Monocytes Absolute: 1.4 10*3/uL — ABNORMAL HIGH (ref 0.1–1.0)
Monocytes Relative: 9 %
Neutro Abs: 12.7 10*3/uL — ABNORMAL HIGH (ref 1.7–7.7)
Neutrophils Relative %: 79 %
Platelets: 103 10*3/uL — ABNORMAL LOW (ref 150–400)
RBC: 2.14 MIL/uL — ABNORMAL LOW (ref 4.22–5.81)
RDW: 19.1 % — ABNORMAL HIGH (ref 11.5–15.5)
WBC: 16 10*3/uL — ABNORMAL HIGH (ref 4.0–10.5)
nRBC: 0 % (ref 0.0–0.2)

## 2022-02-23 LAB — COMPREHENSIVE METABOLIC PANEL
ALT: 11 U/L (ref 0–44)
AST: 34 U/L (ref 15–41)
Albumin: 2.7 g/dL — ABNORMAL LOW (ref 3.5–5.0)
Alkaline Phosphatase: 87 U/L (ref 38–126)
Anion gap: 5 (ref 5–15)
BUN: 37 mg/dL — ABNORMAL HIGH (ref 8–23)
CO2: 21 mmol/L — ABNORMAL LOW (ref 22–32)
Calcium: 8.6 mg/dL — ABNORMAL LOW (ref 8.9–10.3)
Chloride: 113 mmol/L — ABNORMAL HIGH (ref 98–111)
Creatinine, Ser: 1.06 mg/dL (ref 0.61–1.24)
GFR, Estimated: >60 mL/min (ref >60)
Glucose, Bld: 109 mg/dL — ABNORMAL HIGH (ref 70–99)
Potassium: 4 mmol/L (ref 3.5–5.1)
Sodium: 139 mmol/L (ref 135–145)
Total Bilirubin: 5.2 mg/dL — ABNORMAL HIGH (ref 0.3–1.2)
Total Protein: 6.2 g/dL — ABNORMAL LOW (ref 6.5–8.1)

## 2022-02-23 LAB — PHOSPHORUS: Phosphorus: 2.6 mg/dL (ref 2.5–4.6)

## 2022-02-23 LAB — AMMONIA: Ammonia: 46 umol/L — ABNORMAL HIGH (ref 9–35)

## 2022-02-23 MED ORDER — IOHEXOL 9 MG/ML PO SOLN
ORAL | Status: AC
  Administered 2022-02-23: 500 mL
  Filled 2022-02-23: qty 1000

## 2022-02-23 MED ORDER — IOHEXOL 9 MG/ML PO SOLN: 500.0000 mL | ORAL | Status: DC

## 2022-02-23 MED ORDER — IOHEXOL 9 MG/ML PO SOLN
500.0000 mL | ORAL | Status: AC
  Administered 2022-02-24 (×2): 500 mL via ORAL

## 2022-02-23 NOTE — Progress Notes (Signed)
[facility]ious [facility]  Date of Admission:  02/16/2022      Abx: 10/19-c cefazolin  10/21-c rifaximin  10/18-19 cefepime/flagyl  Impression: Staph lugdenensis bacteremia Septic l4-5 arthritis and quadratus lumborum small abscess (1cm). Tte no obvious vegetation Splenic infarct suspect septic  EtoH cirrhosis with esophageal varices and PHG; decompensated  Left knee arthroplasty and left shoulder screw/prior surgery   10/18 bcx positive 10/21 bcx negative  Aki from sepsis improving Hepatic encephalopathy also better on abx and rifaximine/lactulose  Thrombocytopenia ?sepsis related and cirrhosis. too early for cefazolin  Given quadratus lumborum involvement, this is considered the equivalent/corollary of vertebral osteomyelitis. Treatment will need to be at least 6 weeks Splenic infarct early to say at this point but could turn in to abscess  As he had cleared his bacteremia, I do not see need to push for tee at this time  He has no new pain in his shoulders (bilateral chronic and had some left shoulder steroid injection 1 week prior to this admission) or his left knee   02/23/22 assessment Continue to significantly improve rather quickly for a decompensated cirrhosis patient. Up in chair, mentation quick appears to be baseline No new focal pain    Plan: -continue cefazolin -will plan a repeat abd/pelv ct with contrast tomorrow to reevaluate spleen and see if abscesses had developed there; in cirrhotic patient rather deceiving how one looks -discussed with primary team  I spent more than 35 minute reviewing data/chart, and coordinating care and >50% direct face to face time providing counseling/discussing diagnostics/treatment plan with patient    Principal Problem:   Splenic infarction Active Problems:   Alcohol abuse, daily use   HTN (hypertension)   Hyperlipidemia   Elevated LFTs   GERD (gastroesophageal reflux disease)   Cirrhosis  (HCC)   Leukocytosis   AKI (acute kidney injury) ([place])   Splenic infarct   Thrombocytopenia (HCC)   Melena   Acute blood loss anemia   Secondary esophageal varices without bleeding ([place])   Portal hypertensive gastropathy ([place])   Ulcer of esophagus without bleeding   Acute hepatic encephalopathy ([place])   Coag negative Staphylococcus bacteremia   Intra-abdominal abscess (HCC)   Weakness   Goals of care, counseling/discussion   Palliative care encounter   Counseling and coordination of care   No Known Allergies  Scheduled Meds:  folic acid  1 mg Oral Daily   lactulose  20 g Oral TID   lidocaine  1 patch Transdermal Q24H   metoprolol succinate  50 mg Oral Daily   multivitamin with minerals  1 tablet Oral Daily   pantoprazole (PROTONIX) IV  40 mg Intravenous Q12H   rifaximin  550 mg Oral BID   sodium chloride flush  3 mL Intravenous Q12H   sucralfate  1 g Oral TID WC & HS   thiamine  100 mg Oral Daily   Or   thiamine  100 mg Intravenous Daily   Continuous Infusions:   ceFAZolin (ANCEF) IV 2 g (02/23/22 1018)   PRN Meds:.acetaminophen **OR** acetaminophen, HYDROmorphone (DILAUDID) injection, lip balm, methocarbamol, oxyCODONE, polyethylene glycol, sodium chloride   SUBJECTIVE: No complaint to day Up in chair Family very happy  Review of Systems: ROS All other ROS was negative, except mentioned above     OBJECTIVE: Vitals:   02/22/22 2210 02/23/22 0000 02/23/22 0526 02/23/22 0636  BP: (!) 140/58 (!) 140/58 139/64   Pulse: 81 81 (!) 41 80  Resp: 20  18   Temp: 99.5 F (37.5 C)  99.2 F (37.3 C)   TempSrc:   Oral   SpO2: 96%  95%   Weight:   98.7 kg   Height:       Body mass index is 34.08 kg/m.  Physical Exam  General/constitutional: no distress, pleasant, up in chair, conversant well HEENT: Normocephalic, PER, Conj Clear, EOMI, Oropharynx clear Neck supple CV: rrr no mrg Lungs: clear to auscultation, normal respiratory effort Abd: Soft,  Nontender Ext: no edema Skin: No Rash Neuro: strength still not baseline and is 4-5/5 symmetric; nonfocal; alert/oriented MSK: no peripheral joint swelling/tenderness/warmth; back spines nontender   Lab Results Lab Results  Component Value Date   WBC 16.0 (H) 02/23/2022   HGB 7.2 (L) 02/23/2022   HCT 22.5 (L) 02/23/2022   MCV 105.1 (H) 02/23/2022   PLT 103 (L) 02/23/2022    Lab Results  Component Value Date   CREATININE 1.06 02/23/2022   BUN 37 (H) 02/23/2022   NA 139 02/23/2022   K 4.0 02/23/2022   CL 113 (H) 02/23/2022   CO2 21 (L) 02/23/2022    Lab Results  Component Value Date   ALT 11 02/23/2022   AST 34 02/23/2022   ALKPHOS 87 02/23/2022   BILITOT 5.2 (H) 02/23/2022      Microbiology: Recent Results (from the past 240 hour(s))  Blood culture (routine x 2)     Status: Abnormal   Collection Time: 02/16/22  5:39 PM   Specimen: BLOOD  Result Value Ref Range Status   Specimen Description   Final    BLOOD RIGHT ANTECUBITAL Performed at [facility], [address] [place], [place] [address]    Special Requests   Final    Blood Culture adequate volume BOTTLES DRAWN AEROBIC ONLY Performed at [facility], [place], [place] [address]    Culture  Setup Time   Final    GRAM POSITIVE COCCI IN CLUSTERS AEROBIC BOTTLE ONLY    Culture (A)  Final    STAPHYLOCOCCUS LUGDUNENSIS SUSCEPTIBILITIES PERFORMED ON PREVIOUS CULTURE WITHIN THE LAST 5 DAYS. Performed at [facility], [place] [address] [place], [place] [address]    Report Status 02/19/2022 FINAL  Final  Blood culture (routine x 2)     Status: Abnormal   Collection Time: 02/16/22  5:39 PM   Specimen: BLOOD  Result Value Ref Range Status   Specimen Description   Final    BLOOD LEFT ANTECUBITAL Performed at [facility], [address], [place], [place] [address]    Special Requests   Final    Blood Culture adequate volume BOTTLES  DRAWN AEROBIC AND ANAEROBIC Performed at [facility], [place], [place] [address]    Culture  Setup Time   Final    GRAM POSITIVE COCCI IN CLUSTERS ANAEROBIC BOTTLE ONLY CRITICAL RESULT CALLED TO, READ BACK BY AND VERIFIED WITH: PHARMD MICHELLE BELL ON 02/17/22 @ 1606 BY DRT Performed at [facility], [place] [address] [place], [place] [address]    Culture STAPHYLOCOCCUS LUGDUNENSIS (A)  Final   Report Status 02/19/2022 FINAL  Final   Organism ID, Bacteria STAPHYLOCOCCUS LUGDUNENSIS  Final      Susceptibility   Staphylococcus lugdunensis - MIC*    CIPROFLOXACIN <=0.5 SENSITIVE Sensitive     ERYTHROMYCIN <=0.25 SENSITIVE Sensitive     GENTAMICIN <=0.5 SENSITIVE Sensitive     OXACILLIN 0.5 SENSITIVE Sensitive  TETRACYCLINE <=1 SENSITIVE Sensitive     VANCOMYCIN <=0.5 SENSITIVE Sensitive     TRIMETH/SULFA <=10 SENSITIVE Sensitive     CLINDAMYCIN <=0.25 SENSITIVE Sensitive     RIFAMPIN <=0.5 SENSITIVE Sensitive     Inducible Clindamycin NEGATIVE Sensitive     * STAPHYLOCOCCUS LUGDUNENSIS  Blood Culture ID Panel (Reflexed)     Status: Abnormal   Collection Time: 02/16/22  5:39 PM  Result Value Ref Range Status   Enterococcus faecalis NOT DETECTED NOT DETECTED Final   Enterococcus Faecium NOT DETECTED NOT DETECTED Final   Listeria monocytogenes NOT DETECTED NOT DETECTED Final   Staphylococcus species DETECTED (A) NOT DETECTED Final    Comment: CRITICAL RESULT CALLED TO, READ BACK BY AND VERIFIED WITH: PHARMD MICHELLE BELL ON 02/17/22 @ 1606 BY DRT    Staphylococcus aureus (BCID) NOT DETECTED NOT DETECTED Final   Staphylococcus epidermidis NOT DETECTED NOT DETECTED Final   Staphylococcus lugdunensis DETECTED (A) NOT DETECTED Final    Comment: CRITICAL RESULT CALLED TO, READ BACK BY AND VERIFIED WITH: PHARMD MICHELLE BELL ON 02/17/22 @ 1606 BY DRT    Streptococcus species NOT DETECTED NOT DETECTED Final   Streptococcus agalactiae NOT  DETECTED NOT DETECTED Final   Streptococcus pneumoniae NOT DETECTED NOT DETECTED Final   Streptococcus pyogenes NOT DETECTED NOT DETECTED Final   A.calcoaceticus-baumannii NOT DETECTED NOT DETECTED Final   Bacteroides fragilis NOT DETECTED NOT DETECTED Final   Enterobacterales NOT DETECTED NOT DETECTED Final   Enterobacter cloacae complex NOT DETECTED NOT DETECTED Final   Escherichia coli NOT DETECTED NOT DETECTED Final   Klebsiella aerogenes NOT DETECTED NOT DETECTED Final   Klebsiella oxytoca NOT DETECTED NOT DETECTED Final   Klebsiella pneumoniae NOT DETECTED NOT DETECTED Final   Proteus species NOT DETECTED NOT DETECTED Final   Salmonella species NOT DETECTED NOT DETECTED Final   Serratia marcescens NOT DETECTED NOT DETECTED Final   Haemophilus influenzae NOT DETECTED NOT DETECTED Final   Neisseria meningitidis NOT DETECTED NOT DETECTED Final   Pseudomonas aeruginosa NOT DETECTED NOT DETECTED Final   Stenotrophomonas maltophilia NOT DETECTED NOT DETECTED Final   Candida albicans NOT DETECTED NOT DETECTED Final   Candida auris NOT DETECTED NOT DETECTED Final   Candida glabrata NOT DETECTED NOT DETECTED Final   Candida krusei NOT DETECTED NOT DETECTED Final   Candida parapsilosis NOT DETECTED NOT DETECTED Final   Candida tropicalis NOT DETECTED NOT DETECTED Final   Cryptococcus neoformans/gattii NOT DETECTED NOT DETECTED Final   Methicillin resistance mecA/C NOT DETECTED NOT DETECTED Final    Comment: Performed at [facility], [address] [place], [place] [address]  Culture, blood (Routine X 2) w Reflex to ID Panel     Status: None (Preliminary result)   Collection Time: 02/19/22 12:26 AM   Specimen: BLOOD RIGHT HAND  Result Value Ref Range Status   Specimen Description   Final    BLOOD RIGHT HAND Performed at [facility] [address] [place], [place] [address]    Special Requests   Final    BOTTLES DRAWN AEROBIC ONLY BCLV Performed at [facility] [address], [place], [place] [address]    Culture   Final    NO GROWTH 4 DAYS Performed at [facility], [place] [address], [place], [place] [address]    Report Status PENDING  Incomplete  Culture, blood (Routine X 2) w Reflex to ID Panel     Status: None (Preliminary result)  Collection Time: 02/19/22  6:06 AM   Specimen: BLOOD  Result Value Ref Range Status   Specimen Description   Final    BLOOD BLOOD RIGHT HAND Performed at [facility], [place] [address], [place], [place] [address]    Special Requests   Final    BOTTLES DRAWN AEROBIC ONLY Blood Culture adequate volume Performed at [facility] [address] [place], [place] [address]    Culture   Final    NO GROWTH 4 DAYS Performed at [facility], [place] [address] [place] [address] [place], [place] [address]    Report Status PENDING  Incomplete     Serology:   Imaging: If present, new imagings (plain films, ct scans, and mri) have been personally visualized and interpreted; radiology reports have been reviewed. Decision making incorporated into the Impression / Recommendations.  10/21 tte  1. Left ventricular ejection fraction, by estimation, is 70 to 75%. The  left ventricle has hyperdynamic function. The left ventricle has no  regional wall motion abnormalities. Left ventricular diastolic parameters  were normal.   2. Right ventricular systolic function is normal. The right ventricular  size is normal. There is normal pulmonary artery systolic pressure.   3. The mitral valve is normal in structure. No evidence of mitral valve  regurgitation. No evidence of mitral stenosis.   4. The aortic valve is tricuspid. There is moderate calcification of the  aortic valve. There is moderate thickening of the aortic valve. Aortic  valve regurgitation is not visualized. No aortic stenosis is present.   5. The inferior vena cava is normal in size with greater than 50%   respiratory variability, suggesting right atrial pressure of 3 mmHg.    10/21 thoracic lumbar mri IMPRESSION: Thoracic MRI:   No evidence of infection.   Lumbar MRI:   1. L4-5 right facet effusion with posterior dissecting periarticular fluid collection, presumed septic facet arthritis in this clinical setting. The collection measures up to 13 mm. 2. 1 cm collection in the upper right quadratus lumborum, presumably intramuscular seeding and abscess.    Jabier Mutton, [place] [place] [phone] pager    02/23/2022, 10:58 AM

## 2022-02-23 NOTE — Evaluation (Signed)
Physical Therapy Evaluation Patient Details Name: Marc Ponce MRN: [id number] DOB: 1955/06/26 Today's Date: 02/23/2022  History of Present Illness  Patient is a 66 year old male admitted 02/16/22 or management of low back pain and incontinence.  During hospitalization patient has had imaging which has shown cirrhosis with portal hypertension and splenomegaly with small ascites and also a moderate splenic infarct.  Patient was seen by GI and underwent endoscopic evaluation on 02/19/2022 and was found to have oozing from portal gastropathy. Patient also found to have bacteremia and so had MRI imaging which showed L4-L5 right facet effusion with some periarticular fluid collection thought to be septic facet arthritis and potential abscess in the right quadratus lumborum. Palliative care involved. PMH includes: steatohepatitis, chronic alcohol use, HTN, hyperlipidemia, GERD, and diverticulosis    Clinical Impression  Marc Ponce is 66 y.o. male admitted with above HPI and diagnosis. Patient is currently limited by functional impairments below (see PT problem list). Patient lives with his wife and PTA was independent with all mobility and self care. He currently requires Mod assist for bed mobility and Min +2 for safety with transfers and [facility]. Pt with short shuffled steps while turning to move from EOB to recliner but overall steady with no lateral LOB or buckling noted. Patient will benefit from continued skilled PT interventions to address impairments and progress independence with mobility, recommending HHPT when medically ready for return home. Acute PT will follow and progress as able.        Recommendations for follow up therapy are one component of a multi-disciplinary discharge planning process, led by the attending physician.  Recommendations may be updated based on patient status, additional functional criteria and insurance authorization.  PT Recommendation   Follow Up  Recommendations Home health PT Filed 02/23/2022 0909  Assistance recommended at discharge Frequent or constant Supervision/Assistance Filed 02/23/2022 0998  Patient can return home with the following A lot of help with walking and/or transfers, A lot of help with bathing/dressing/bathroom, Assistance with cooking/housework, Direct supervision/assist for medications management, Assist for transportation, Help with stairs or ramp for entrance [facility] 02/23/2022 0909  Functional Status Assessment Patient has had a recent decline in their functional status and demonstrates the ability to make significant improvements in function in a reasonable and predictable amount of time. Filed 02/23/2022 0909  PT equipment Rolling walker (2 wheels), BSC/3in1  [TBD] Filed 02/23/2022 0909   Precautions / Restrictions Precautions Precautions: Fall Precaution Comments: flexi-seal Restrictions Weight Bearing Restrictions: No      Mobility  Bed Mobility Overal bed mobility: Needs Assistance Bed Mobility: Supine to Sit     Supine to sit: Mod assist, HOB elevated, +2 for safety/equipment          Transfers Overall transfer level: Needs assistance Equipment used: 2 person hand held assist Transfers: Sit to/from Stand, Bed to chair/wheelchair/BSC Sit to Stand: Min assist, +2 safety/equipment, +2 physical assistance, From elevated surface   Step pivot transfers: Min assist, +2 safety/equipment, +2 physical assistance, From elevated surface            Ambulation/Gait                  Stairs            Wheelchair Mobility    Modified Rankin (Stroke Patients Only)       Balance Overall balance assessment: Needs assistance Sitting-balance support: No upper extremity supported, Feet supported Sitting balance-Leahy Scale: Fair     Standing balance support: Bilateral upper  extremity supported, During functional activity Standing balance-Leahy Scale: Poor                                Pertinent Vitals/Pain      Home Living Family/patient expects to be discharged to:: Private residence Living Arrangements: Spouse/significant other Available Help at Discharge: Family Type of Home: House (split leve) Home Access: Stairs to enter;Level entry       Home Layout: Full bath on main level;Able to live on main level with bedroom/bathroom Home Equipment: Grab bars - tub/shower;Shower seat;Hand held Engineering geologist (2 wheels);Cane - single point      Prior Function Prior Level of Function : Independent/Modified Independent             Mobility Comments:  (rapid decline over ~1 week with admission on 10/19. pt typically working (retired but Teacher, music) drives locally) ADLs Comments: independent prior to 1 week bf admission     Hand Dominance        Extremity/Trunk Assessment   Upper Extremity Assessment Upper Extremity Assessment: Generalized weakness;RUE deficits/detail;LUE deficits/detail RUE Deficits / Details: generally 4/5 RUE Coordination: decreased fine motor LUE Deficits / Details: generally 4/5 LUE Coordination: decreased fine motor    Lower Extremity Assessment Lower Extremity Assessment: Defer to PT evaluation       Communication   Communication: No difficulties  Cognition Arousal/Alertness: Awake/alert Behavior During Therapy: WFL for tasks assessed/performed Overall Cognitive Status: Impaired/Different from baseline Area of Impairment: Attention, Following commands, Problem solving                   Current Attention Level: Selective   Following Commands: Follows one step commands consistently, Follows one step commands with increased time     Problem Solving: Slow processing, Decreased initiation, Requires verbal cues, Difficulty sequencing General Comments: pleasant, cooprerative, and motivated to participate        General Comments General comments (skin integrity, edema, etc.): wife Vaughan Basta  present throughout, very supportive      PT - End of Session  Equipment Utilized During Treatment Gait belt  Activity Tolerance Patient tolerated treatment well  Patient left in chair;with call bell/phone within reach;with chair alarm set;with family/visitor present  Nurse Communication Mobility status  PT Assessment  PT Recommendation/Assessment Patient needs continued PT services  PT Visit Diagnosis Unsteadiness on feet (R26.81);Other abnormalities of gait and mobility (R26.89);Muscle weakness (generalized) (M62.81);Difficulty in walking, not elsewhere classified (R26.2)  PT Problem List Decreased strength;Decreased activity tolerance;Decreased balance;Decreased mobility;Decreased knowledge of use of DME;Decreased knowledge of precautions;Decreased safety awareness;Obesity  PT Plan  PT Frequency (ACUTE ONLY) Min 3X/week  PT Treatment/Interventions (ACUTE ONLY) DME instruction;Gait training;Stair training;Functional mobility training;Therapeutic activities;Therapeutic exercise;Balance training;Patient/family education  AM-PAC PT "6 Clicks" Mobility Outcome Measure (Version 2)  Help needed turning from your back to your side while in a flat bed without using bedrails? 2  Help needed moving from lying on your back to sitting on the side of a flat bed without using bedrails? 2  Help needed moving to and from a bed to a chair (including a wheelchair)? 2  Help needed standing up from a chair using your arms (e.g., wheelchair or bedside chair)? 2  Help needed to walk in hospital room? 2  Help needed climbing 3-5 steps with a railing?  2  6 Click Score 12  Consider Recommendation of Discharge To: CIR/SNF/LTACH  Progressive Mobility  What is the highest level of mobility  based on the progressive mobility assessment? Level 3 (Stands with assist) - Balance while standing  and cannot march in place  Mobility Referral No  Activity Transferred from bed to chair;Stood at bedside  PT Recommendation   Follow Up Recommendations Home health PT  Assistance recommended at discharge Frequent or constant Supervision/Assistance  Patient can return home with the following A lot of help with walking and/or transfers;A lot of help with bathing/dressing/bathroom;Assistance with cooking/housework;Direct supervision/assist for medications management;Assist for transportation;Help with stairs or ramp for entrance  Functional Status Assessment Patient has had a recent decline in their functional status and demonstrates the ability to make significant improvements in function in a reasonable and predictable amount of time.  PT equipment Rolling walker (2 wheels);BSC/3in1 (TBD)  Individuals Consulted  Consulted and Agree with Results and Recommendations Patient;Family member/caregiver  Family Member Consulted pt's wife present during session  Acute Rehab PT Goals  Patient Stated Goal regain functional independence  PT Goal Formulation With patient/family  Time For Goal Achievement 03/09/22  Potential to Achieve Goals Good  PT Time Calculation  PT Start Time (ACUTE ONLY) 0907  PT Stop Time (ACUTE ONLY) 0943  PT Time Calculation (min) (ACUTE ONLY) 36 min  PT General Charges  $$ ACUTE PT VISIT 1 Visit  PT Evaluation  $PT Eval Moderate Complexity 1 Mod    Verner Mould, DPT Acute Rehabilitation Services Office [phone]  02/23/22 1:19 PM

## 2022-02-23 NOTE — Progress Notes (Signed)
PROGRESS NOTE    Marc Ponce  KDT:[id number] DOB: 1955-09-08 DOA: 02/16/2022 PCP: [name], MD    Brief Narrative:  Marc Ponce is a 66 y.o. male with past medical history significant of steatohepatitis, chronic alcohol use, hypertension, hyperlipidemia, GERD, diverticulosis presented to the hospital with increasing low back pain with incontinence.  Patient did have a history of back pain/strain on February 11, 2022. Patient also complains of associated generalized fatigue and fall.  He does have a history of significant alcohol abuse and had to stop stopped drinking on October 11 due to his pain.  In the lab work showed hyponatremia with sodium of 133 creatinine was elevated at 2.0.  Glucose was 164, albumin 2.6, AST 82, alk phos 156, T. bili 8.  CBC showed leukocytosis at 18.5, hemoglobin stable at 12.4, platelets stable at 148.  TSH and T4 normal.  Lactic acid initially elevated to 3.2 but improved to 2.5 with IV fluids.  Ammonia was elevated at 86.  Urinalysis with trace hemoglobin only.   X-ray of the left knee showed no acute abnormality, CT head showed no acute abnormality.  CT of the chest abdomen pelvis showed small bilateral pleural effusions with atelectasis, cirrhosis with portal hypertension and splenomegaly with small ascites.  Also noted was moderate splenic infarct.   Magnesium level mildly elevated at 2.8.  Patient received cefepime, Flagyl, Toradol, morphine, lactulose, heparin, IV fluids, B1 in the ED and was considered for admission to the hospital for further evaluation and treatment.  During hospitalization, patient was seen by GI and underwent endoscopic evaluation on 02/19/2022 with oozing from  portal gastropathy. Heparin drip was discontinued.  ID has seen the patient due to bacteremia and MRI of the thoracic and lumbar spine shows L4-L5 right facet effusion with some periarticular fluid collection thought to be septic facet arthritis around 13 mm.  1 cm  collection in the right quadratus lumborum.  Nonsurgical at this time.  On IV cefazolin..  2D echocardiogram with LV ejection fraction of 70 to 75% with no regional wall motion abnormality.      Assessment and plan.  Principal Problem:   Splenic infarction Active Problems:   Alcohol abuse, daily use   HTN (hypertension)   Hyperlipidemia   Elevated LFTs   GERD (gastroesophageal reflux disease)   Cirrhosis (HCC)   Leukocytosis   AKI (acute kidney injury) ([name])   Splenic infarct   Thrombocytopenia (HCC)   Melena   Acute blood loss anemia   Secondary esophageal varices without bleeding (HCC)   Portal hypertensive gastropathy (HCC)   Ulcer of esophagus without bleeding   Acute hepatic encephalopathy (HCC)   Bacteremia   Intra-abdominal abscess (HCC)   Weakness   Goals of care, counseling/discussion   Palliative care encounter   Counseling and coordination of care   Splenic infarct Noted to have splenic infarct on CT scan of the abdomen. Due to GI bleed,  Heparin drip was discontinued.  2D echocardiogram did not show any vegetation.  Not a candidate for anticoagulation at this time. Repeat CT scan scheduled for tomorrow to look for extent of damage.  AKI  Likely secondary to NSAID and poor oral intake.  Could not rule out hepatorenal syndrome.  Received albumin with improving creatinine levels..  Creatinine 1.18 <2.1.   Continue to hold  losartan-hydrochlorothiazide.     Alcoholic cirrhosis of liver with melena and bright red blood.   underwent EGD 02/19/2022 with findings of single column of esophageal varices,  small esophageal ulcer which was clean-based, mild portal hypertensive gastropathy with contact oozing.  Continue Protonix IV twice daily.  Now with severe debilitated condition and developing hepatic encephalopathy. Started on rifaximin.  Currently on lactulose and having loose bowel movements. Can use Flexi-Seal. Ammonia levels improving.  Mental status is improving.   Start mobilizing. We will continue Flexi-Seal until improvement of mobility.  Acute blood loss anemia secondary to GI bleed.  Hemodynamically stable.  No need for transfusion.  Latest hemoglobin of 7.2.  Continue with Protonix IV.  We will transfuse for less than 7.  Alcohol use with mild withdrawal. Was a  heavy drinker.  Stopped drinking after February 09, 2022.  No withdrawal symptoms.  Continue CIWA protocol for now.  Continue thiamine, folic acid and multivitamin  Leukocytosis, staph lugdunensis bacteremia with L4-L5 facet septic arthritis. Had recent use of steroids.  Urinalysis negative.  CT scan of abdomen pelvis without any acute source of infection.  Infectious disease on board.  MRI of the lumbar spine showing L4-L5 facet septic arthritis with some collection.  Non-neurosurgical case..  On IV cefazolin.  Repeat blood cultures on 02/19/2022 with no growth so far.  Will likely need  long-term antibiotics on discharge.  ID following. Repeat  scans planned for tomorrow.  GERD Continue IV PPI   Hypertension Continue to hold losartan and HCTZ.  Continue metoprolol.  Goal of care discussion:  Patient some clinical improvement today.   DNR/DNI discussed and documented on 10/23.   Start mobilizing, anticipate home with home health therapies if adequate improvement.   Palliative following.    DVT prophylaxis: Place and maintain sequential compression device Start: 02/19/22 1325      Code Status:     Code Status: DNR  Disposition: Possible home with home health PT OT, palliative care  Status is: Inpatient  The patient is inpatient because: GI bleed, cirrhosis of liver, splenic infarct, hepatic encephalopathy, status post endoscopy On IV antibiotics   Family Communication:  Wife at the bedside.  Consultants:  GI Infectious disease. Oncology Dr. Rica Records  Procedures:  Upper GI endoscopy with biopsy  Antimicrobials:  Cefazolin, rifaximin  Subjective: Patient  seen and examined in the morning rounds.  Mental status has improved.  He tells me that he has no trouble swallowing.  Denies any nausea vomiting.  Denies any abdominal pain or bloating.  His friends are visiting him from [place] and he was very delighted to have conversation with them.  Objective: Vitals:   02/22/22 2210 02/23/22 0000 02/23/22 0526 02/23/22 0636  BP: (!) 140/58 (!) 140/58 139/64   Pulse: 81 81 (!) 41 80  Resp: 20  18   Temp: 99.5 F (37.5 C)  99.2 F (37.3 C)   TempSrc:   Oral   SpO2: 96%  95%   Weight:   98.7 kg   Height:        Intake/Output Summary (Last 24 hours) at 02/23/2022 1417 Last data filed at 02/23/2022 0941 Gross per 24 hour  Intake 1539.92 ml  Output 2100 ml  Net -560.08 ml    Filed Weights   02/18/22 0500 02/19/22 0500 02/23/22 0526  Weight: 98 kg 99.6 kg 98.7 kg    Physical Examination: Body mass index is 34.08 kg/m.   General:  Icteric.  Looks fairly comfortable today. Cardiovascular: S1-S2 normal.  Regular rate rhythm. Respiratory: Bilateral clear with some upper airway sounds.  No other added sounds. Gastrointestinal: Soft.  Nontender.  Distended.  Bowel sound present.  Large amount of loose stool in the rectal tube. Ext: 1+ edema all extremities.  No cyanosis.  No clubbing. Neuro: Alert awake oriented.  Moves all extremities.  No asterixis.  No tremors. Musculoskeletal: No deformities. Skin: Intact.  Data Reviewed:   CBC: Recent Labs  Lab 02/16/22 1700 02/17/22 0141 02/19/22 0606 02/19/22 1446 02/19/22 1705 02/20/22 0553 02/21/22 0505 02/22/22 0505 02/23/22 0446  WBC 18.5*   < > 19.5*  --   --  14.0* 11.8* 14.8* 16.0*  NEUTROABS 15.6*  --   --   --   --   --   --  11.8* 12.7*  HGB 12.4*   < > 8.7*   < > 11.3* 8.7* 7.2* 7.3* 7.2*  HCT 36.9*   < > 26.4*   < > 35.4* 26.4* 22.6* 22.9* 22.5*  MCV 98.4   < > 100.0  --   --  100.8* 103.7* 106.0* 105.1*  PLT 148*   < > 117*  --   --  101* 83* 89* 103*   < > = values in this  interval not displayed.     Basic Metabolic Panel: Recent Labs  Lab 02/18/22 0528 02/19/22 0606 02/20/22 0553 02/21/22 0505 02/22/22 0505 02/23/22 0446  NA 135 137 145 145 143 139  K 4.2 3.9 3.8 3.9 3.5 4.0  CL 110 112* 117* 117* 115* 113*  CO2 18* 18* 20* 21* 20* 21*  GLUCOSE 125* 123* 118* 121* 117* 109*  BUN 107* 91* 83* 61* 46* 37*  CREATININE 2.19* 1.83* 1.48* 1.26* 1.18 1.06  CALCIUM 7.7* 8.0* 8.7* 8.9 8.9 8.6*  MG 2.7* 3.0* 2.6* 2.5* 2.0  --   PHOS  --   --   --   --  2.1* 2.6     Liver Function Tests: Recent Labs  Lab 02/18/22 0528 02/20/22 0553 02/21/22 0505 02/22/22 0505 02/23/22 0446  AST 56* 42* 41 35 34  ALT 32 15 14 11 11   ALKPHOS 112 104 86 81 87  BILITOT 5.6* 5.2* 5.4* 5.7* 5.2*  PROT 5.7* 6.7 6.8 6.2* 6.2*  ALBUMIN 1.6* 2.9* 3.6 3.0* 2.7*      Radiology Studies: No results found.    LOS: 6 days    Barb Merino, MD [facility] Available via Epic secure chat 7am-7pm After these hours, please refer to coverage provider listed on [email or web address] 02/23/2022, 2:17 PM

## 2022-02-23 NOTE — Evaluation (Signed)
Occupational Therapy Evaluation Patient Details Name: SEHAJ MCENROE MRN: [id number] DOB: 1955-05-20 Today's Date: 02/23/2022   History of Present Illness Patient is a 66 year old male admitted 02/16/22 or management of low back pain and incontinence.  During hospitalization patient has had imaging which has shown cirrhosis with portal hypertension and splenomegaly with small ascites and also a moderate splenic infarct.  Patient was seen by GI and underwent endoscopic evaluation on 02/19/2022 and was found to have oozing from portal gastropathy. Patient also found to have bacteremia and so had MRI imaging which showed L4-L5 right facet effusion with some periarticular fluid collection thought to be septic facet arthritis and potential abscess in the right quadratus lumborum. Palliative care involved. 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 He has experienced significant decline and recently (within the last month) has been needing assist with bathing/dressing, falling frequently - and walking with HHA.  Today he is min A +2 for transfers and short in-room mobility prior to transfer to recliner. He was max A for LB dressing, min A for UB dressing, set up for grooming tasks in seated position. Pt has very supportive wife "Vaughan Basta" and will benefit from skilled OT in the acute setting as well as afterwards at the [facility] level. Next session to continue OOB activity and functional activity tolerance through ADL as well as energy conservation education.        Recommendations for follow up therapy are one component of a multi-disciplinary discharge planning process, led by the attending physician.  Recommendations may be updated based on patient status, additional functional  criteria and insurance authorization.   Follow Up Recommendations  Home health OT    Assistance Recommended at Discharge Frequent or constant Supervision/Assistance  Patient can return home with the following A little help with walking and/or transfers;A lot of help with bathing/dressing/bathroom;Assistance with cooking/housework;Direct supervision/assist for medications management;Assist for transportation;Help with stairs or ramp for entrance    Functional Status Assessment  Patient has had a recent decline in their functional status and demonstrates the ability to make significant improvements in function in a reasonable and predictable amount of time.  Equipment Recommendations  BSC/3in1    Recommendations for Other Services PT consult     Precautions / Restrictions Precautions Precautions: Fall Precaution Comments: flexi-seal Restrictions Weight Bearing Restrictions: No      Mobility Bed Mobility Overal bed mobility: Needs Assistance Bed Mobility: Supine to Sit     Supine to sit: Mod assist, HOB elevated, +2 for safety/equipment     General bed mobility comments: cues for sequencing, heavy use of bed rail    Transfers Overall transfer level: Needs assistance Equipment used: 2 person hand held assist Transfers: Sit to/from Stand, Bed to chair/wheelchair/BSC Sit to Stand: Min assist, +2 physical assistance, +2 safety/equipment     Step pivot transfers: Min assist, +2 physical assistance, +2 safety/equipment     General transfer comment: good power up from Pt, assist for balance and steadying assist, very small unsure steps      Balance Overall balance assessment: Needs assistance Sitting-balance support: No upper extremity supported, Feet supported Sitting balance-Leahy Scale: Fair Sitting balance - Comments: initially mod A, progressed to min guard   Standing balance support: Bilateral upper extremity supported, Single extremity supported Standing  balance-Leahy Scale: Poor Standing balance  comment: dependent on external support from therapists                           ADL either performed or assessed with clinical judgement   ADL Overall ADL's : Needs assistance/impaired Eating/Feeding: NPO   Grooming: Wash/dry hands;Wash/dry face;Set up;Sitting   Upper Body Bathing: Minimal assistance;Sitting   Lower Body Bathing: Moderate assistance;Sitting/lateral leans Lower Body Bathing Details (indicate cue type and reason): knees down Upper Body Dressing : Minimal assistance;Sitting Upper Body Dressing Details (indicate cue type and reason): donning extra gown Lower Body Dressing: Maximal assistance;Sitting/lateral leans Lower Body Dressing Details (indicate cue type and reason): donning socks, unable to perform figure 4 at this time Toilet Transfer: Minimal assistance;+2 for physical assistance;+2 for safety/equipment (hand held assist)   Toileting- Clothing Manipulation and Hygiene: Total assistance Toileting - Clothing Manipulation Details (indicate cue type and reason): currently with flexi-seal and male purewick     Functional mobility during ADLs: Minimal assistance;+2 for physical assistance;+2 for safety/equipment (hand held assist) General ADL Comments: decreased cognition, balance, strength, activity tolerance     Vision Patient Visual Report: No change from baseline       Perception     Praxis      Pertinent Vitals/Pain       Hand Dominance     Extremity/Trunk Assessment Upper Extremity Assessment Upper Extremity Assessment: Generalized weakness;RUE deficits/detail;LUE deficits/detail RUE Deficits / Details: generally 4/5 RUE Coordination: decreased fine motor LUE Deficits / Details: generally 4/5 LUE Coordination: decreased fine motor   Lower Extremity Assessment Lower Extremity Assessment: Defer to PT evaluation       Communication Communication Communication: No difficulties    Cognition Arousal/Alertness: Awake/alert Behavior During Therapy: WFL for tasks assessed/performed Overall Cognitive Status: Impaired/Different from baseline Area of Impairment: Attention, Following commands, Problem solving                   Current Attention Level: Selective   Following Commands: Follows one step commands consistently, Follows one step commands with increased time     Problem Solving: Slow processing, Decreased initiation, Requires verbal cues General Comments: Pt overall cooperative     General Comments  wife Vaughan Basta present throughout, very supportive    Exercises     Shoulder Instructions      Home Living Family/patient expects to be discharged to:: Private residence Living Arrangements: Spouse/significant other Available Help at Discharge: Family Type of Home: House (split leve) Home Access: Stairs to enter;Level entry     Home Layout: Full bath on main level;Able to live on main level with bedroom/bathroom     Bathroom Shower/Tub: Tub/shower unit   Bathroom Toilet: Handicapped height Bathroom Accessibility: Yes   Home Equipment: Grab bars - tub/shower;Shower seat;Hand held Engineering geologist (2 wheels);Cane - single point          Prior Functioning/Environment Prior Level of Function : Independent/Modified Independent             Mobility Comments:  (rapid decline over ~1 week with admission on 10/19. pt typically working (retired but Teacher, music) drives locally) ADLs Comments: independent prior to 1 week bf admission        OT Problem List: Decreased strength;Decreased activity tolerance;Impaired balance (sitting and/or standing);Decreased cognition;Decreased safety awareness;Decreased knowledge of use of DME or AE;Obesity      OT Treatment/Interventions: Self-care/ADL training;Energy conservation;DME and/or AE instruction;Therapeutic activities;Patient/family education;Balance training    OT Goals(Current goals can  be found in  the care plan section) Acute Rehab OT Goals Patient Stated Goal: Get home, get stronger OT Goal Formulation: With patient/family Time For Goal Achievement: 03/09/22 Potential to Achieve Goals: Good ADL Goals Pt Will Perform Grooming: with supervision;standing Pt Will Perform Upper Body Dressing: with supervision;sitting Pt Will Perform Lower Body Dressing: with min guard assist;sit to/from stand Pt Will Transfer to Toilet: with supervision;ambulating Pt Will Perform Toileting - Clothing Manipulation and hygiene: with supervision;sit to/from stand Additional ADL Goal #1: Pt wil verbalize at least 3 ways of conserving energy during ADL without cues  OT Frequency: Min 2X/week    Co-evaluation              AM-PAC OT "6 Clicks" Daily Activity     Outcome Measure Help from another person eating meals?: Total (NPO) Help from another person taking care of personal grooming?: A Little Help from another person toileting, which includes using toliet, bedpan, or urinal?: A Little Help from another person bathing (including washing, rinsing, drying)?: A Lot Help from another person to put on and taking off regular upper body clothing?: A Little Help from another person to put on and taking off regular lower body clothing?: A Lot 6 Click Score: 14   End of Session Equipment Utilized During Treatment: Gait belt Nurse Communication: Mobility status;Other (comment) (Iv infusion complete)  Activity Tolerance: Patient tolerated treatment well Patient left: in chair;with call bell/phone within reach;with chair alarm set;with family/visitor present  OT Visit Diagnosis: Unsteadiness on feet (R26.81);Muscle weakness (generalized) (M62.81);Adult, failure to thrive (R62.7);History of falling (Z91.81)                Time: 1947-1252 OT Time Calculation (min): 36 min Charges:  OT General Charges $OT Visit: 1 Visit OT Evaluation $OT Eval Moderate Complexity: [place] OTR/L Acute  Rehabilitation Services Office: [place] 02/23/2022, 12:08 PM

## 2022-02-23 NOTE — Progress Notes (Signed)
Daily Progress Note   Patient Name: Marc Ponce       Date: 02/23/2022 DOB: 08/17/1955  Age: 66 y.o. MRN#: [id number] Attending Physician: Barb Merino, MD Primary Care Physician: [name], MD Admit Date: 02/16/2022 Length of Stay: 6 days  Reason for Consultation/Follow-up: Establishing goals of care  Subjective:   CC: Patient sitting up in chair at bedside feeling better today.  Following up regarding goals of care.  Subjective:  Reviewed EMR prior to presenting to bedside.  Ammonia stable at 46.  Total bilirubin remains elevated at 5.2.  Creatinine has down trended to 1.06 with GFR >60.  Leukocytosis increased to 16.  Presented to bedside to check on patient this AM.  Patient seen sitting up in chair at bedside.  Patient much more awake and interactive today.  Patient's wife, Vaughan Basta, was also present at bedside.  Re-introdcued myself as a member of the palliative care team.  Patient was able to describe how he do was able to get out of bed this morning and work with physical therapy.  Noted that he does feel he is regaining his strength that has been absent for the past few days.  Due to his improvement at this time, there is consideration for home with physical therapy as per patient and his wife.  Patient denies any symptoms of concern at this time.  Patient is looking forward to working with physical therapy to regain his strength and to be able to walk to the bathroom.  Discussed idea of outpatient palliative care follow-up at home to continue important conversations with patient's known underlying cirrhosis.  Patient and wife agreeing with home palliative care follow-up.  All questions are answered at that time.  Thanked patient and wife for allowing me to visit today.  Review of Systems  Respiratory:  Negative for shortness of breath.   Cardiovascular:  Negative for chest pain.  Gastrointestinal:  Negative for abdominal pain.    Objective:   Vital Signs:   BP 139/64 (BP Location: Right Arm)   Pulse 80   Temp 99.2 F (37.3 C) (Oral)   Resp 18   Ht 5' 7"  (1.702 m)   Wt 98.7 kg   SpO2 95%   BMI 34.08 kg/m   Physical Exam: General: NAD, alert, interactive, sitting up in chair at bedside Eyes: No drainage noted HENT: moist mucous membranes Cardiovascular: RRR Respiratory: no increase work of breathing noted, not in respiratory distress Abdomen: Distended, FMS still in place Extremities: Edema in all extremities Skin: no rashes or lesions on visible skin Neuro: A&Ox4, following commands easily Psych: appropriately answers all questions  Assessment & Plan:   Assessment: Patient is a 66 year old male with a past medical history of steatohepatitis, chronic alcohol use, hypertension, hyperlipidemia, GERD, and diverticulosis who was admitted on 02/16/2022 for management of low back pain and incontinence.  During hospitalization patient has had imaging which has shown cirrhosis with portal hypertension and splenomegaly with small ascites and also a moderate splenic infarct.  Patient was seen by GI and underwent endoscopic evaluation on 02/19/2022 and was found to have oozing from portal gastropathy.  Anticoagulation had to be discontinued in the setting.  Patient also found to have bacteremia and so had MRI imaging which showed L4-L5 right facet effusion with some periarticular fluid collection thought to be septic facet arthritis and potential abscess in the right quadratus lumborum.  ID was consulted for recommendations and antibiotic management.  Palliative care consulted to assist with complex medical  decision making.   Recommendations/Plan: # Complex medical decision making/goals of care:  - Patient sitting up in bedside chair today noted to be much more interactive and appropriately answering questions.  Patient looking forward to working with physical therapy to regain his strength.                               -On 10/24, spent extensive  time with Vaughan Basta describing overall trajectory regarding decompensated alcoholic cirrhosis associated with esophageal varices, portal hypertension, and encephalopathy in the setting of continued alcohol use.                  -  Code Status: DNR   # Psychosocial Support:  - Support System: Wife -Vaughan Basta, children, grandchildren   # Discharge Planning: Potentially home with home health based on PTs recommendations  -We will place [facility] referral at this time for home palliative care follow-up after discharge to continue medical discussions in the outpatient setting.  Thank you for allowing the palliative care team to participate in the care Hannah Beat.   Chelsea Aus, DO Palliative Care Provider PMT # [phone]  This provider spent a total of 36 minutes providing patient's care.  Includes review of EMR, discussing care with other staff members involved in patient's medical care, obtaining relevant history and information from patient and/or patient's family, and personal review of imaging and lab work. Greater than 50% of the time was spent counseling and coordinating care related to the above assessment and plan.

## 2022-02-24 ENCOUNTER — Inpatient Hospital Stay (HOSPITAL_COMMUNITY): Payer: Medicare Other

## 2022-02-24 ENCOUNTER — Other Ambulatory Visit (HOSPITAL_COMMUNITY): Payer: Self-pay

## 2022-02-24 ENCOUNTER — Telehealth (HOSPITAL_COMMUNITY): Payer: Self-pay | Admitting: Pharmacy Technician

## 2022-02-24 DIAGNOSIS — M4620 Osteomyelitis of vertebra, site unspecified: Secondary | ICD-10-CM | POA: Diagnosis not present

## 2022-02-24 DIAGNOSIS — D735 Infarction of spleen: Secondary | ICD-10-CM | POA: Diagnosis not present

## 2022-02-24 DIAGNOSIS — K7460 Unspecified cirrhosis of liver: Secondary | ICD-10-CM | POA: Diagnosis not present

## 2022-02-24 DIAGNOSIS — Z515 Encounter for palliative care: Secondary | ICD-10-CM | POA: Diagnosis not present

## 2022-02-24 DIAGNOSIS — R531 Weakness: Secondary | ICD-10-CM | POA: Diagnosis not present

## 2022-02-24 DIAGNOSIS — K651 Peritoneal abscess: Secondary | ICD-10-CM | POA: Diagnosis not present

## 2022-02-24 LAB — COMPREHENSIVE METABOLIC PANEL
ALT: 11 U/L (ref 0–44)
AST: 36 U/L (ref 15–41)
Albumin: 2.6 g/dL — ABNORMAL LOW (ref 3.5–5.0)
Alkaline Phosphatase: 88 U/L (ref 38–126)
Anion gap: 5 (ref 5–15)
BUN: 34 mg/dL — ABNORMAL HIGH (ref 8–23)
CO2: 20 mmol/L — ABNORMAL LOW (ref 22–32)
Calcium: 8.8 mg/dL — ABNORMAL LOW (ref 8.9–10.3)
Chloride: 112 mmol/L — ABNORMAL HIGH (ref 98–111)
Creatinine, Ser: 1.07 mg/dL (ref 0.61–1.24)
GFR, Estimated: >60 mL/min (ref >60)
Glucose, Bld: 118 mg/dL — ABNORMAL HIGH (ref 70–99)
Potassium: 4.2 mmol/L (ref 3.5–5.1)
Sodium: 137 mmol/L (ref 135–145)
Total Bilirubin: 4.5 mg/dL — ABNORMAL HIGH (ref 0.3–1.2)
Total Protein: 6.3 g/dL — ABNORMAL LOW (ref 6.5–8.1)

## 2022-02-24 LAB — CBC WITH DIFFERENTIAL/PLATELET
Abs Immature Granulocytes: 0.25 10*3/uL — ABNORMAL HIGH (ref 0.00–0.07)
Basophils Absolute: 0.1 10*3/uL (ref 0.0–0.1)
Basophils Relative: 0 %
Eosinophils Absolute: 0.3 10*3/uL (ref 0.0–0.5)
Eosinophils Relative: 2 %
HCT: 22.2 % — ABNORMAL LOW (ref 39.0–52.0)
Hemoglobin: 7.1 g/dL — ABNORMAL LOW (ref 13.0–17.0)
Immature Granulocytes: 2 %
Lymphocytes Relative: 7 %
Lymphs Abs: 1.1 10*3/uL (ref 0.7–4.0)
MCH: 33.8 pg (ref 26.0–34.0)
MCHC: 32 g/dL (ref 30.0–36.0)
MCV: 105.7 fL — ABNORMAL HIGH (ref 80.0–100.0)
Monocytes Absolute: 1.4 10*3/uL — ABNORMAL HIGH (ref 0.1–1.0)
Monocytes Relative: 9 %
Neutro Abs: 11.9 10*3/uL — ABNORMAL HIGH (ref 1.7–7.7)
Neutrophils Relative %: 80 %
Platelets: 102 10*3/uL — ABNORMAL LOW (ref 150–400)
RBC: 2.1 MIL/uL — ABNORMAL LOW (ref 4.22–5.81)
RDW: 19 % — ABNORMAL HIGH (ref 11.5–15.5)
WBC: 14.9 10*3/uL — ABNORMAL HIGH (ref 4.0–10.5)
nRBC: 0 % (ref 0.0–0.2)

## 2022-02-24 LAB — CULTURE, BLOOD (ROUTINE X 2)
Culture: NO GROWTH
Culture: NO GROWTH
Special Requests: ADEQUATE

## 2022-02-24 LAB — SURGICAL PATHOLOGY

## 2022-02-24 LAB — AMMONIA: Ammonia: 38 umol/L — ABNORMAL HIGH (ref 9–35)

## 2022-02-24 MED ORDER — LACTULOSE 10 GM/15ML PO SOLN: 20.0000 g | Freq: Two times a day (BID) | ORAL | Status: DC | PRN

## 2022-02-24 MED ORDER — PANTOPRAZOLE SODIUM 40 MG PO TBEC
40.0000 mg | DELAYED_RELEASE_TABLET | Freq: Two times a day (BID) | ORAL | Status: DC
  Administered 2022-02-24 – 2022-02-28 (×9): 40 mg via ORAL
  Filled 2022-02-24 (×9): qty 1

## 2022-02-24 MED ORDER — IOHEXOL 300 MG/ML  SOLN
100.0000 mL | Freq: Once | INTRAMUSCULAR | Status: AC | PRN
  Administered 2022-02-24: 100 mL via INTRAVENOUS

## 2022-02-24 NOTE — Care Management Important Message (Signed)
Important Message  Patient Details IM Letter placed in Patients room. Name: Marc Ponce MRN: [id number] Date of Birth: 14-Jan-1956   Medicare Important Message Given:  Yes     Kerin Salen 02/24/2022, 10:45 AM

## 2022-02-24 NOTE — Telephone Encounter (Signed)
Pharmacy Patient Advocate Encounter  Insurance verification completed.    The patient is insured through [employer] Part D   The patient is currently admitted and ran test claims for the following: linezolid (Zyvox) .  Copays and coinsurance results were relayed to Inpatient clinical team.

## 2022-02-24 NOTE — Progress Notes (Signed)
PROGRESS NOTE    Marc Ponce  AJO:[id number] DOB: [birth year] DOA: 02/16/2022 PCP: [name], MD    Brief Narrative:  66 year old with steatohepatitis, chronic alcohol use, hypertension, hyperlipidemia, GERD presented to ER with increasing low back pain and incontinence.  Had recently stopped drinking.  He does have chronic liver disease with bilirubin 4-5 range.  In the emergency room creatinine 2, total bili 8, WC count 18.   CT scan showed splenic infarct, subsequent MRI showed L4/L5 right facet effusion and periarticular fluid consistent with abscess.  10/18 blood culture positive for staph lugdunensis 10/21, blood cultures negative. Remains very sick with hepatic encephalopathy and confusion, he did ultimately stabilized and improved.  Assessment and plan.  Principal Problem:   Splenic infarction Active Problems:   Alcohol abuse, daily use   HTN (hypertension)   Hyperlipidemia   Elevated LFTs   GERD (gastroesophageal reflux disease)   Cirrhosis (HCC)   Leukocytosis   AKI (acute kidney injury) ([place])   Splenic infarct   Thrombocytopenia (HCC)   Melena   Acute blood loss anemia   Secondary esophageal varices without bleeding (HCC)   Portal hypertensive gastropathy (HCC)   Ulcer of esophagus without bleeding   Acute hepatic encephalopathy (HCC)   Bacteremia   Intra-abdominal abscess (HCC)   Weakness   Goals of care, counseling/discussion   Palliative care encounter   Counseling and coordination of care    Sepsis present on admission secondary to bacteremia from staph lugdunensis L4-L5 facet septic arthritis with no cord compression, nonsurgical Quadratus lumborum abscess, not big enough to drain. Blood cultures 10/18 positive.  10/21 negative.  Clinically improving.  Repeat CT scan abdomen pelvis scheduled for today.  After different antibiotics, currently remains on cefazolin.  Followed by ID.  Anticipate long-term IV antibiotics.  Yet to be  finalized.  Splenic infarct Noted to have splenic infarct on CT scan of the abdomen. Due to GI bleed,  Heparin drip was discontinued.  2D echocardiogram did not show any vegetation.  Not a candidate for anticoagulation at this time. Repeat CT scan scheduled for today.  AKI  Likely secondary to NSAID and poor oral intake.  Could not rule out hepatorenal syndrome.  Received albumin with improving creatinine levels..  Creatinine 1.18 <2.1.   Continue to hold  losartan-hydrochlorothiazide.  Improving.   Alcoholic cirrhosis of liver with melena and bright red blood.   underwent EGD 02/19/2022 with findings of single column of esophageal varices, small esophageal ulcer which was clean-based, mild portal hypertensive gastropathy with contact oozing.  Stabilizing.  Hemoglobin has remained stable for last 72 hours. Changed to oral Protonix. Discontinue Flexi-Seal. Change lactulose to as needed to make 2-3 bowel movements a day. Mental status is improving. Discontinue telemetry monitor to help mobilize around with PT OT and family.  Acute blood loss anemia hemoglobin is stable.  Alcohol use with mild withdrawal. Was a  heavy drinker.  Stopped drinking after February 09, 2022.  No withdrawal symptoms.  Continue CIWA protocol for now.  Continue thiamine, folic acid and multivitamin  Hypertension Continue to hold losartan and HCTZ.  Continue metoprolol.  Goal of care discussion:  Patient some clinical improvement today.  He wants to revisit his DNR.  He asked for MOST form for details so he can document. I encouraged him to go through the scope of treatment and we will revisit.  Currently he will be remaining DNR with full scope of treatment.    DVT prophylaxis: Place and maintain sequential  compression device Start: 02/19/22 1325      Code Status:     Code Status: DNR with full scope of treatment.  Disposition: Possible home with home health PT OT, palliative care  Status is:  Inpatient  The patient is inpatient because: GI bleed, cirrhosis of liver, splenic infarct, hepatic encephalopathy, status post endoscopy On IV antibiotics   Family Communication:  Wife at the bedside.  Consultants:  GI Infectious disease. Oncology Dr. Rica Records  Procedures:  Upper GI endoscopy with biopsy  Antimicrobials:  Cefazolin, rifaximin  Subjective: Seen and examined.  Without any complaints today.  Denies any nausea or vomiting.  He does have some abdominal pain and there was some trouble with Flexi-Seal feeding last night.  Afebrile.  Mild back sore present.  He thinks he can walk around and may need some help. He wants to revise his CODE STATUS and go through in details.  Objective: Vitals:   02/23/22 1417 02/23/22 2153 02/24/22 0437 02/24/22 0500  BP: (!) 135/59 (!) 142/62 (!) 132/56   Pulse: 79 77 78   Resp: 18 20 16    Temp: 99.4 F (37.4 C) 98.6 F (37 C) 98.2 F (36.8 C)   TempSrc: Oral Oral Oral   SpO2:  98% 97%   Weight:    97.8 kg  Height:        Intake/Output Summary (Last 24 hours) at 02/24/2022 1132 Last data filed at 02/24/2022 4401 Gross per 24 hour  Intake 654 ml  Output 1350 ml  Net -696 ml    Filed Weights   02/19/22 0500 02/23/22 0526 02/24/22 0500  Weight: 99.6 kg 98.7 kg 97.8 kg    Physical Examination: Body mass index is 33.77 kg/m.   General:  Icteric.  Comfortable and communicative today.  No asterixis.  No tremors. Cardiovascular: S1-S2 normal.  Regular rate rhythm. Respiratory: Bilateral clear with some upper airway sounds.  No other added sounds. Gastrointestinal: Soft.  Nontender.  Distended.  Bowel sound present.  Rectal tube is empty. Ext: 1+ edema all extremities.  No cyanosis.  No clubbing. Neuro: Alert awake oriented.  Moves all extremities.  No asterixis.  No tremors. Musculoskeletal: No deformities. Skin: Intact.  Data Reviewed:   CBC: Recent Labs  Lab 02/20/22 0553 02/21/22 0505 02/22/22 0505  02/23/22 0446 02/24/22 0455  WBC 14.0* 11.8* 14.8* 16.0* 14.9*  NEUTROABS  --   --  11.8* 12.7* 11.9*  HGB 8.7* 7.2* 7.3* 7.2* 7.1*  HCT 26.4* 22.6* 22.9* 22.5* 22.2*  MCV 100.8* 103.7* 106.0* 105.1* 105.7*  PLT 101* 83* 89* 103* 102*     Basic Metabolic Panel: Recent Labs  Lab 02/18/22 0528 02/19/22 0606 02/20/22 0553 02/21/22 0505 02/22/22 0505 02/23/22 0446 02/24/22 0455  NA 135 137 145 145 143 139 137  K 4.2 3.9 3.8 3.9 3.5 4.0 4.2  CL 110 112* 117* 117* 115* 113* 112*  CO2 18* 18* 20* 21* 20* 21* 20*  GLUCOSE 125* 123* 118* 121* 117* 109* 118*  BUN 107* 91* 83* 61* 46* 37* 34*  CREATININE 2.19* 1.83* 1.48* 1.26* 1.18 1.06 1.07  CALCIUM 7.7* 8.0* 8.7* 8.9 8.9 8.6* 8.8*  MG 2.7* 3.0* 2.6* 2.5* 2.0  --   --   PHOS  --   --   --   --  2.1* 2.6  --      Liver Function Tests: Recent Labs  Lab 02/20/22 0553 02/21/22 0505 02/22/22 0505 02/23/22 0446 02/24/22 0455  AST 42* 41 35 34 36  ALT 15 14 11 11 11   ALKPHOS 104 86 81 87 88  BILITOT 5.2* 5.4* 5.7* 5.2* 4.5*  PROT 6.7 6.8 6.2* 6.2* 6.3*  ALBUMIN 2.9* 3.6 3.0* 2.7* 2.6*      Radiology Studies: No results found.    LOS: 7 days    Barb Merino, MD [facility] Available via Epic secure chat 7am-7pm After these hours, please refer to coverage provider listed on [email or web address] 02/24/2022, 11:32 AM

## 2022-02-24 NOTE — Progress Notes (Signed)
PT Cancellation Note  Patient Details Name: Marc Ponce MRN: [id number] DOB: June 15, 1955   Cancelled Treatment:     CT abdomen pelvis w/ contrast today. Will attempt to see another day as schedule permits.   Allyson Sabal 02/24/2022, 4:16 PM

## 2022-02-24 NOTE — TOC Progression Note (Signed)
Transition of Care [facility]) - Progression Note    Patient Details  Name: Marc Ponce MRN: [id number] Date of Birth: [birth year]  Transition of Care [facility]) CM/SW [place], LCSW Phone Number: 02/24/2022, 4:08 PM  Clinical Narrative:    Met with pt and wife at bedside to discuss discharge plans. Pt and wife are agreeable to home health services being arranged. Pt agreeable to having RW and BSC ordered and delivered to room. Pt/spouse also requesting a hospital bed at home.  RW and BSC have been ordered through [employer] and will be delivered to pt's room prior to discharge.  CSW currently seeking home health agency for HHPT/OT.  MD would like to follow pt's progression prior to discharge to determine if hospital bed will be needed.    Expected Discharge Plan: Home/Self Care Barriers to Discharge: No Barriers Identified  Expected Discharge Plan and Services Expected Discharge Plan: Home/Self Care In-house Referral: NA Discharge Planning Services: CM Consult Post Acute Care Choice: NA Living arrangements for the past 2 months: Single Family Home                 DME Arranged: Bedside commode, Walker rolling DME Agency: [employer] Date DME Agency Contacted: 02/24/22 Time DME Agency Contacted: 1100 Representative spoke with at DME Agency: Andee Poles             Social Determinants of Health ([place]) Interventions Housing Interventions: Patient Refused  Readmission Risk Interventions     No data to display

## 2022-02-24 NOTE — Progress Notes (Signed)
Rectal tube discontinued patient tolerated well. Bed placed low position call bell within reach.

## 2022-02-24 NOTE — Progress Notes (Signed)
[facility]  Date of Admission:  02/16/2022      Abx: 10/19-c cefazolin  10/21-c rifaximin  10/18-19 cefepime/flagyl  Impression: Staph lugdenensis bacteremia Septic l4-5 arthritis and quadratus lumborum small abscess (1cm). Tte no obvious vegetation Splenic infarct suspect septic  EtoH cirrhosis with esophageal varices and PHG; decompensated  Left knee arthroplasty and left shoulder screw/prior surgery   10/18 bcx positive 10/21 bcx negative  Aki from sepsis improving Hepatic encephalopathy also better on abx and rifaximine/lactulose  Thrombocytopenia ?sepsis related and cirrhosis. too early for cefazolin  Given quadratus lumborum involvement, this is considered the equivalent/corollary of vertebral osteomyelitis. Treatment will need to be at least 6 weeks Splenic infarct early to say at this point but could turn in to abscess  As he had cleared his bacteremia, I do not see need to push for tee at this time  He has no new pain in his shoulders (bilateral chronic and had some left shoulder steroid injection 1 week prior to this admission) or his left knee   10/26 assessment Doing clinically well, awaiting ct No question/concern today    Plan: -continue cefazolin -await abd pelv ct with contrast -if no splenic abscess formation would send out with 4 weeks linezolid and see in ID clinic for f/u -discussed with primary team   Clinic Follow Up Appt: 11/09 @ 330pm with dr Gale Journey  @  [facility] [address], [place], [place] [address] Phone: [phone]  I spent more than 35 minute reviewing data/chart, and coordinating care and >50% direct face to face time providing counseling/discussing diagnostics/treatment plan with patient     Principal Problem:   Splenic infarction Active Problems:   Alcohol abuse, daily use   HTN (hypertension)   Hyperlipidemia   Elevated LFTs   GERD (gastroesophageal reflux disease)    Cirrhosis (HCC)   Leukocytosis   AKI (acute kidney injury) ([place])   Splenic infarct   Thrombocytopenia (HCC)   Melena   Acute blood loss anemia   Secondary esophageal varices without bleeding ([place])   Portal hypertensive gastropathy ([place])   Ulcer of esophagus without bleeding   Acute hepatic encephalopathy (HCC)   Bacteremia   Intra-abdominal abscess (HCC)   Weakness   Goals of care, counseling/discussion   Palliative care encounter   Counseling and coordination of care   No Known Allergies  Scheduled Meds:  folic acid  1 mg Oral Daily   lidocaine  1 patch Transdermal Q24H   metoprolol succinate  50 mg Oral Daily   multivitamin with minerals  1 tablet Oral Daily   pantoprazole  40 mg Oral BID   rifaximin  550 mg Oral BID   sodium chloride flush  3 mL Intravenous Q12H   sucralfate  1 g Oral TID WC & HS   thiamine  100 mg Oral Daily   Continuous Infusions:   ceFAZolin (ANCEF) IV 2 g (02/24/22 1020)   PRN Meds:.acetaminophen **OR** acetaminophen, lactulose, lip balm, methocarbamol, oxyCODONE, sodium chloride   SUBJECTIVE: Await ct No complaint  Review of Systems: ROS All other ROS was negative, except mentioned above     OBJECTIVE: Vitals:   02/23/22 1417 02/23/22 2153 02/24/22 0437 02/24/22 0500  BP: (!) 135/59 (!) 142/62 (!) 132/56   Pulse: 79 77 78   Resp: 18 20 16    Temp: 99.4 F (37.4 C) 98.6 F (37 C) 98.2 F (36.8 C)   TempSrc: Oral Oral Oral  SpO2:  98% 97%   Weight:    97.8 kg  Height:       Body mass index is 33.77 kg/m.  Physical Exam  General/constitutional: no distress, pleasant HEENT: Normocephalic, PER, Conj Clear, EOMI, Oropharynx clear Neck supple CV: rrr no mrg Lungs: clear to auscultation, normal respiratory effort Abd: Soft, Nontender Ext: no edema Skin: No Rash Neuro: nonfocal MSK: no peripheral joint swelling/tenderness/warmth; back spines nontender     Lab Results Lab Results  Component Value Date   WBC 14.9 (H)  02/24/2022   HGB 7.1 (L) 02/24/2022   HCT 22.2 (L) 02/24/2022   MCV 105.7 (H) 02/24/2022   PLT 102 (L) 02/24/2022    Lab Results  Component Value Date   CREATININE 1.07 02/24/2022   BUN 34 (H) 02/24/2022   NA 137 02/24/2022   K 4.2 02/24/2022   CL 112 (H) 02/24/2022   CO2 20 (L) 02/24/2022    Lab Results  Component Value Date   ALT 11 02/24/2022   AST 36 02/24/2022   ALKPHOS 88 02/24/2022   BILITOT 4.5 (H) 02/24/2022      Microbiology: Recent Results (from the past 240 hour(s))  Blood culture (routine x 2)     Status: Abnormal   Collection Time: 02/16/22  5:39 PM   Specimen: BLOOD  Result Value Ref Range Status   Specimen Description   Final    BLOOD RIGHT ANTECUBITAL Performed at [facility], [address] [place], [place] [address]    Special Requests   Final    Blood Culture adequate volume BOTTLES DRAWN AEROBIC ONLY Performed at [facility], [place], [place] [address]    Culture  Setup Time   Final    GRAM POSITIVE COCCI IN CLUSTERS AEROBIC BOTTLE ONLY    Culture (A)  Final    STAPHYLOCOCCUS LUGDUNENSIS SUSCEPTIBILITIES PERFORMED ON PREVIOUS CULTURE WITHIN THE LAST 5 DAYS. Performed at [facility], [place] [address] [place], [place] [address]    Report Status 02/19/2022 FINAL  Final  Blood culture (routine x 2)     Status: Abnormal   Collection Time: 02/16/22  5:39 PM   Specimen: BLOOD  Result Value Ref Range Status   Specimen Description   Final    BLOOD LEFT ANTECUBITAL Performed at [facility], [address] [place], [place] [address]    Special Requests   Final    Blood Culture adequate volume BOTTLES DRAWN AEROBIC AND ANAEROBIC Performed at [facility], [place], [place] [address]    Culture  Setup Time   Final    GRAM POSITIVE COCCI IN CLUSTERS ANAEROBIC BOTTLE ONLY CRITICAL RESULT CALLED TO, READ BACK BY AND VERIFIED  WITH: PHARMD MICHELLE BELL ON 02/17/22 @ 1606 BY DRT Performed at [facility], [place] [address] [place] [address], [place], [place] [address]    Culture STAPHYLOCOCCUS LUGDUNENSIS (A)  Final   Report Status 02/19/2022 FINAL  Final   Organism ID, Bacteria STAPHYLOCOCCUS LUGDUNENSIS  Final      Susceptibility   Staphylococcus lugdunensis - MIC*    CIPROFLOXACIN <=0.5 SENSITIVE Sensitive     ERYTHROMYCIN <=0.25 SENSITIVE Sensitive     GENTAMICIN <=0.5 SENSITIVE Sensitive     OXACILLIN 0.5 SENSITIVE Sensitive     TETRACYCLINE <=1 SENSITIVE Sensitive     VANCOMYCIN <=0.5 SENSITIVE Sensitive     TRIMETH/SULFA <=10 SENSITIVE Sensitive     CLINDAMYCIN <=0.25 SENSITIVE Sensitive     RIFAMPIN <=  0.5 SENSITIVE Sensitive     Inducible Clindamycin NEGATIVE Sensitive     * STAPHYLOCOCCUS LUGDUNENSIS  Blood Culture ID Panel (Reflexed)     Status: Abnormal   Collection Time: 02/16/22  5:39 PM  Result Value Ref Range Status   Enterococcus faecalis NOT DETECTED NOT DETECTED Final   Enterococcus Faecium NOT DETECTED NOT DETECTED Final   Listeria monocytogenes NOT DETECTED NOT DETECTED Final   Staphylococcus species DETECTED (A) NOT DETECTED Final    Comment: CRITICAL RESULT CALLED TO, READ BACK BY AND VERIFIED WITH: PHARMD MICHELLE BELL ON 02/17/22 @ 1606 BY DRT    Staphylococcus aureus (BCID) NOT DETECTED NOT DETECTED Final   Staphylococcus epidermidis NOT DETECTED NOT DETECTED Final   Staphylococcus lugdunensis DETECTED (A) NOT DETECTED Final    Comment: CRITICAL RESULT CALLED TO, READ BACK BY AND VERIFIED WITH: PHARMD MICHELLE BELL ON 02/17/22 @ 1606 BY DRT    Streptococcus species NOT DETECTED NOT DETECTED Final   Streptococcus agalactiae NOT DETECTED NOT DETECTED Final   Streptococcus pneumoniae NOT DETECTED NOT DETECTED Final   Streptococcus pyogenes NOT DETECTED NOT DETECTED Final   A.calcoaceticus-baumannii NOT DETECTED NOT DETECTED Final   Bacteroides fragilis NOT DETECTED NOT DETECTED Final    Enterobacterales NOT DETECTED NOT DETECTED Final   Enterobacter cloacae complex NOT DETECTED NOT DETECTED Final   Escherichia coli NOT DETECTED NOT DETECTED Final   Klebsiella aerogenes NOT DETECTED NOT DETECTED Final   Klebsiella oxytoca NOT DETECTED NOT DETECTED Final   Klebsiella pneumoniae NOT DETECTED NOT DETECTED Final   Proteus species NOT DETECTED NOT DETECTED Final   Salmonella species NOT DETECTED NOT DETECTED Final   Serratia marcescens NOT DETECTED NOT DETECTED Final   Haemophilus influenzae NOT DETECTED NOT DETECTED Final   Neisseria meningitidis NOT DETECTED NOT DETECTED Final   Pseudomonas aeruginosa NOT DETECTED NOT DETECTED Final   Stenotrophomonas maltophilia NOT DETECTED NOT DETECTED Final   Candida albicans NOT DETECTED NOT DETECTED Final   Candida auris NOT DETECTED NOT DETECTED Final   Candida glabrata NOT DETECTED NOT DETECTED Final   Candida krusei NOT DETECTED NOT DETECTED Final   Candida parapsilosis NOT DETECTED NOT DETECTED Final   Candida tropicalis NOT DETECTED NOT DETECTED Final   Cryptococcus neoformans/gattii NOT DETECTED NOT DETECTED Final   Methicillin resistance mecA/C NOT DETECTED NOT DETECTED Final    Comment: Performed at [facility], [address], [place], [place] [address]  Culture, blood (Routine X 2) w Reflex to ID Panel     Status: None   Collection Time: 02/19/22 12:26 AM   Specimen: BLOOD RIGHT HAND  Result Value Ref Range Status   Specimen Description   Final    BLOOD RIGHT HAND Performed at [facility] [address] [place], [place] [address]    Special Requests   Final    BOTTLES DRAWN AEROBIC ONLY BCLV Performed at [facility] [address], [place], [place] [address]    Culture   Final    NO GROWTH 5 DAYS Performed at [facility], [place] [address], [place], [place] [address]    Report Status 02/24/2022 FINAL  Final  Culture, blood (Routine X 2) w Reflex to ID Panel      Status: None   Collection Time: 02/19/22  6:06 AM   Specimen: BLOOD  Result Value Ref Range Status   Specimen Description   Final    BLOOD BLOOD RIGHT HAND Performed at [facility],  [place] [address] [place], [place] [address]    Special Requests   Final    BOTTLES DRAWN AEROBIC ONLY Blood Culture adequate volume Performed at [facility] [address] [place], [place] [address]    Culture   Final    NO GROWTH 5 DAYS Performed at [facility], [place] [address] [place], [place] [address]    Report Status 02/24/2022 FINAL  Final     Serology:   Imaging: If present, new imagings (plain films, ct scans, and mri) have been personally visualized and interpreted; radiology reports have been reviewed. Decision making incorporated into the Impression / Recommendations.  10/21 tte  1. Left ventricular ejection fraction, by estimation, is 70 to 75%. The  left ventricle has hyperdynamic function. The left ventricle has no  regional wall motion abnormalities. Left ventricular diastolic parameters  were normal.   2. Right ventricular systolic function is normal. The right ventricular  size is normal. There is normal pulmonary artery systolic pressure.   3. The mitral valve is normal in structure. No evidence of mitral valve  regurgitation. No evidence of mitral stenosis.   4. The aortic valve is tricuspid. There is moderate calcification of the  aortic valve. There is moderate thickening of the aortic valve. Aortic  valve regurgitation is not visualized. No aortic stenosis is present.   5. The inferior vena cava is normal in size with greater than 50%  respiratory variability, suggesting right atrial pressure of 3 mmHg.    10/21 thoracic lumbar mri IMPRESSION: Thoracic MRI:   No evidence of infection.   Lumbar MRI:   1. L4-5 right facet effusion with posterior dissecting periarticular fluid collection, presumed septic facet arthritis in  this clinical setting. The collection measures up to 13 mm. 2. 1 cm collection in the upper right quadratus lumborum, presumably intramuscular seeding and abscess.    Jabier Mutton, [place] [facility] [place] [phone] pager    02/24/2022, 12:33 PM

## 2022-02-24 NOTE — TOC Benefit Eligibility Note (Signed)
Patient Teacher, English as a foreign language completed.    The patient is currently admitted and upon discharge could be taking linezolid (Zyvox) 600 mg tablets.  The current 28 day co-pay is $100.00.   The patient is insured through [place], [place] Patient Advocate Specialist [place] Patient Advocate Team Direct Number: [phone]  Fax: [phone]

## 2022-02-25 DIAGNOSIS — D735 Infarction of spleen: Secondary | ICD-10-CM | POA: Diagnosis not present

## 2022-02-25 DIAGNOSIS — K7460 Unspecified cirrhosis of liver: Secondary | ICD-10-CM | POA: Diagnosis not present

## 2022-02-25 DIAGNOSIS — R531 Weakness: Secondary | ICD-10-CM | POA: Diagnosis not present

## 2022-02-25 DIAGNOSIS — Z515 Encounter for palliative care: Secondary | ICD-10-CM | POA: Diagnosis not present

## 2022-02-25 LAB — CBC WITH DIFFERENTIAL/PLATELET
Abs Immature Granulocytes: 0.15 10*3/uL — ABNORMAL HIGH (ref 0.00–0.07)
Basophils Absolute: 0.1 10*3/uL (ref 0.0–0.1)
Basophils Relative: 1 %
Eosinophils Absolute: 0.3 10*3/uL (ref 0.0–0.5)
Eosinophils Relative: 2 %
HCT: 23 % — ABNORMAL LOW (ref 39.0–52.0)
Hemoglobin: 7.5 g/dL — ABNORMAL LOW (ref 13.0–17.0)
Immature Granulocytes: 1 %
Lymphocytes Relative: 7 %
Lymphs Abs: 1 10*3/uL (ref 0.7–4.0)
MCH: 34.7 pg — ABNORMAL HIGH (ref 26.0–34.0)
MCHC: 32.6 g/dL (ref 30.0–36.0)
MCV: 106.5 fL — ABNORMAL HIGH (ref 80.0–100.0)
Monocytes Absolute: 1.6 10*3/uL — ABNORMAL HIGH (ref 0.1–1.0)
Monocytes Relative: 12 %
Neutro Abs: 11.1 10*3/uL — ABNORMAL HIGH (ref 1.7–7.7)
Neutrophils Relative %: 77 %
Platelets: 117 10*3/uL — ABNORMAL LOW (ref 150–400)
RBC: 2.16 MIL/uL — ABNORMAL LOW (ref 4.22–5.81)
RDW: 19.9 % — ABNORMAL HIGH (ref 11.5–15.5)
WBC: 14.2 10*3/uL — ABNORMAL HIGH (ref 4.0–10.5)
nRBC: 0 % (ref 0.0–0.2)

## 2022-02-25 LAB — COMPREHENSIVE METABOLIC PANEL
ALT: 11 U/L (ref 0–44)
AST: 39 U/L (ref 15–41)
Albumin: 2.4 g/dL — ABNORMAL LOW (ref 3.5–5.0)
Alkaline Phosphatase: 90 U/L (ref 38–126)
Anion gap: 5 (ref 5–15)
BUN: 31 mg/dL — ABNORMAL HIGH (ref 8–23)
CO2: 19 mmol/L — ABNORMAL LOW (ref 22–32)
Calcium: 8.5 mg/dL — ABNORMAL LOW (ref 8.9–10.3)
Chloride: 110 mmol/L (ref 98–111)
Creatinine, Ser: 0.98 mg/dL (ref 0.61–1.24)
GFR, Estimated: >60 mL/min (ref >60)
Glucose, Bld: 97 mg/dL (ref 70–99)
Potassium: 4.5 mmol/L (ref 3.5–5.1)
Sodium: 134 mmol/L — ABNORMAL LOW (ref 135–145)
Total Bilirubin: 3.8 mg/dL — ABNORMAL HIGH (ref 0.3–1.2)
Total Protein: 6.5 g/dL (ref 6.5–8.1)

## 2022-02-25 MED ORDER — LACTULOSE 10 GM/15ML PO SOLN
20.0000 g | Freq: Two times a day (BID) | ORAL | Status: DC
  Administered 2022-02-25 (×2): 20 g via ORAL
  Filled 2022-02-25 (×2): qty 30

## 2022-02-25 NOTE — Progress Notes (Signed)
  Daily Progress Note   Patient Name: Marc Ponce       Date: 02/25/2022 DOB: [birth year]  Age: 66 y.o. MRN#: [id number] Attending Physician: Barb Merino, MD Primary Care Physician: [name], MD Admit Date: 02/16/2022 Length of Stay: 8 days  Patient last seen by this palliative provider on 02/23/2022.  Since then, patient's mentation and strength have continued to improve.  Planning for discharge home with home health.  Patient and wife had already agreed to referral for home palliative care to continue medical discussions in setting of alcoholic cirrhosis.  Have placed TOC consult today to assist with coordination of home palliative referral.  As goals for medical care are currently determined, palliative care team will sign off.  Please reach out if our team can be of further assistance in the future. Thank you for involving our team in patient's care.    Chelsea Aus, DO Palliative Care Provider PMT # [phone]

## 2022-02-25 NOTE — TOC Progression Note (Signed)
Transition of Care [facility]) - Progression Note    Patient Details  Name: KWAMANE WHACK MRN: [id number] Date of Birth: [birth year]  [facility] CM/SW [place], [place] Phone Number: 02/25/2022, 8:38 AM  Clinical Narrative:    Home health has been arranged with [facility] for PT and OT. HH orders will need to be placed prior to discharge.    Expected Discharge Plan: Home/Self Care Barriers to Discharge: No Barriers Identified  Expected Discharge Plan and Services Expected Discharge Plan: Home/Self Care In-house Referral: NA Discharge Planning Services: CM Consult Post Acute Care Choice: NA Living arrangements for the past 2 months: Single Family Home                 DME Arranged: Bedside commode, Walker rolling DME Agency: [employer] Date DME Agency Contacted: 02/24/22 Time DME Agency Contacted: 1100 Representative spoke with at DME Agency: [place]: PT, OT [place] Agency: [place] Date [place]: 02/24/22 Time [place]: 1630 Representative spoke with at [place]: [place] Determinants of Health ([place]) Interventions Housing Interventions: Patient Refused  Readmission Risk Interventions     No data to display

## 2022-02-25 NOTE — Progress Notes (Signed)
Marc Ponce for Infectious Disease   Reason for visit: Follow up on bacteremia  Interval History: repeat blood cultures remained negative; CT scan with unchanged fluid.   Day 10 total antibiotics  Physical Exam: Constitutional:  Vitals:   02/24/22 1504 02/25/22 0500  BP: 135/60 122/68  Pulse: 79 76  Resp: 18 18  Temp: 98.1 F (36.7 C) 98.4 F (36.9 C)  SpO2: 97% 99%   patient appears in NAD Respiratory: Normal respiratory effort; CTA B Cardiovascular: RRR  Review of Systems: Constitutional: negative for fevers and chills  Lab Results  Component Value Date   WBC 14.2 (H) 02/25/2022   HGB 7.5 (L) 02/25/2022   HCT 23.0 (L) 02/25/2022   MCV 106.5 (H) 02/25/2022   PLT 117 (L) 02/25/2022    Lab Results  Component Value Date   CREATININE 0.98 02/25/2022   BUN 31 (H) 02/25/2022   NA 134 (L) 02/25/2022   K 4.5 02/25/2022   CL 110 02/25/2022   CO2 19 (L) 02/25/2022    Lab Results  Component Value Date   ALT 11 02/25/2022   AST 39 02/25/2022   ALKPHOS 90 02/25/2022     Microbiology: Recent Results (from the past 240 hour(s))  Blood culture (routine x 2)     Status: Abnormal   Collection Time: 02/16/22  5:39 PM   Specimen: BLOOD  Result Value Ref Range Status   Specimen Description   Final    BLOOD RIGHT ANTECUBITAL Performed at [facility], [address] [place], [place] [address]    Special Requests   Final    Blood Culture adequate volume BOTTLES DRAWN AEROBIC ONLY Performed at [facility], [address] [place], [place] [address]    Culture  Setup Time   Final    GRAM POSITIVE COCCI IN CLUSTERS AEROBIC BOTTLE ONLY    Culture (A)  Final    STAPHYLOCOCCUS LUGDUNENSIS SUSCEPTIBILITIES PERFORMED ON PREVIOUS CULTURE WITHIN THE LAST 5 DAYS. Performed at [facility], [place] [address], [place], [place] [address]    Report Status 02/19/2022 FINAL  Final  Blood culture (routine x 2)     Status: Abnormal    Collection Time: 02/16/22  5:39 PM   Specimen: BLOOD  Result Value Ref Range Status   Specimen Description   Final    BLOOD LEFT ANTECUBITAL Performed at [facility], [address] [place], [place] [address]    Special Requests   Final    Blood Culture adequate volume BOTTLES DRAWN AEROBIC AND ANAEROBIC Performed at [facility], [place], [place] [address]    Culture  Setup Time   Final    GRAM POSITIVE COCCI IN CLUSTERS ANAEROBIC BOTTLE ONLY CRITICAL RESULT CALLED TO, READ BACK BY AND VERIFIED WITH: PHARMD MICHELLE BELL ON 02/17/22 @ 1606 BY DRT Performed at [facility], [place] [address], [place], [place] [address]    Culture STAPHYLOCOCCUS LUGDUNENSIS (A)  Final   Report Status 02/19/2022 FINAL  Final   Organism ID, Bacteria STAPHYLOCOCCUS LUGDUNENSIS  Final      Susceptibility   Staphylococcus lugdunensis - MIC*    CIPROFLOXACIN <=0.5 SENSITIVE Sensitive     ERYTHROMYCIN <=0.25 SENSITIVE Sensitive     GENTAMICIN <=0.5 SENSITIVE Sensitive     OXACILLIN 0.5 SENSITIVE Sensitive     TETRACYCLINE <=1 SENSITIVE Sensitive     VANCOMYCIN <=0.5 SENSITIVE Sensitive     TRIMETH/SULFA <=10 SENSITIVE Sensitive  CLINDAMYCIN <=0.25 SENSITIVE Sensitive     RIFAMPIN <=0.5 SENSITIVE Sensitive     Inducible Clindamycin NEGATIVE Sensitive     * STAPHYLOCOCCUS LUGDUNENSIS  Blood Culture ID Panel (Reflexed)     Status: Abnormal   Collection Time: 02/16/22  5:39 PM  Result Value Ref Range Status   Enterococcus faecalis NOT DETECTED NOT DETECTED Final   Enterococcus Faecium NOT DETECTED NOT DETECTED Final   Listeria monocytogenes NOT DETECTED NOT DETECTED Final   Staphylococcus species DETECTED (A) NOT DETECTED Final    Comment: CRITICAL RESULT CALLED TO, READ BACK BY AND VERIFIED WITH: PHARMD MICHELLE BELL ON 02/17/22 @ 1606 BY DRT    Staphylococcus aureus (BCID) NOT DETECTED NOT DETECTED Final   Staphylococcus epidermidis  NOT DETECTED NOT DETECTED Final   Staphylococcus lugdunensis DETECTED (A) NOT DETECTED Final    Comment: CRITICAL RESULT CALLED TO, READ BACK BY AND VERIFIED WITH: PHARMD MICHELLE BELL ON 02/17/22 @ 1606 BY DRT    Streptococcus species NOT DETECTED NOT DETECTED Final   Streptococcus agalactiae NOT DETECTED NOT DETECTED Final   Streptococcus pneumoniae NOT DETECTED NOT DETECTED Final   Streptococcus pyogenes NOT DETECTED NOT DETECTED Final   A.calcoaceticus-baumannii NOT DETECTED NOT DETECTED Final   Bacteroides fragilis NOT DETECTED NOT DETECTED Final   Enterobacterales NOT DETECTED NOT DETECTED Final   Enterobacter cloacae complex NOT DETECTED NOT DETECTED Final   Escherichia coli NOT DETECTED NOT DETECTED Final   Klebsiella aerogenes NOT DETECTED NOT DETECTED Final   Klebsiella oxytoca NOT DETECTED NOT DETECTED Final   Klebsiella pneumoniae NOT DETECTED NOT DETECTED Final   Proteus species NOT DETECTED NOT DETECTED Final   Salmonella species NOT DETECTED NOT DETECTED Final   Serratia marcescens NOT DETECTED NOT DETECTED Final   Haemophilus influenzae NOT DETECTED NOT DETECTED Final   Neisseria meningitidis NOT DETECTED NOT DETECTED Final   Pseudomonas aeruginosa NOT DETECTED NOT DETECTED Final   Stenotrophomonas maltophilia NOT DETECTED NOT DETECTED Final   Candida albicans NOT DETECTED NOT DETECTED Final   Candida auris NOT DETECTED NOT DETECTED Final   Candida glabrata NOT DETECTED NOT DETECTED Final   Candida krusei NOT DETECTED NOT DETECTED Final   Candida parapsilosis NOT DETECTED NOT DETECTED Final   Candida tropicalis NOT DETECTED NOT DETECTED Final   Cryptococcus neoformans/gattii NOT DETECTED NOT DETECTED Final   Methicillin resistance mecA/C NOT DETECTED NOT DETECTED Final    Comment: Performed at [facility], [address], [place], [place] [address]  Culture, blood (Routine X 2) w Reflex to ID Panel     Status: None   Collection Time: 02/19/22 12:26 AM    Specimen: BLOOD RIGHT HAND  Result Value Ref Range Status   Specimen Description   Final    BLOOD RIGHT HAND Performed at [place] [address], [place], [place] [address]    Special Requests   Final    BOTTLES DRAWN AEROBIC ONLY BCLV Performed at [place] [address] [place], [place] [address]    Culture   Final    NO GROWTH 5 DAYS Performed at [facility], [place] [address], [place], [place] [address]    Report Status 02/24/2022 FINAL  Final  Culture, blood (Routine X 2) w Reflex to ID Panel     Status: None   Collection Time: 02/19/22  6:06 AM   Specimen: BLOOD  Result Value Ref Range Status   Specimen Description   Final    BLOOD  BLOOD RIGHT HAND Performed at [facility], [place] [address], [place], [place] [address]    Special Requests   Final    BOTTLES DRAWN AEROBIC ONLY Blood Culture adequate volume Performed at [place] [facility] [address] [place], [place] [address]    Culture   Final    NO GROWTH 5 DAYS Performed at [facility], [place] [address], [place], [place] [address]    Report Status 02/24/2022 FINAL  Final    Impression/Plan:  1. Staph lugundensis bacteremia - repeat cultures negative.  No vegetation on TTE.  Patient improved clinically.  No changes to the plan, linezolid for 4 weeks at discharge Follow up arranged with Dr. Gale Journey on 11/9  2. Thrombocytopenia - platelets slightly up today to 117.  Likely related to cirrhosis, bacteremia.  Will monitor on linezolid at follow up.    3.  Lumbar facet septic arthritis - c/w infection due to #1.  On treatment as above.   4.  Quadratus lumborum fluid collection - likely abscess and treatment as above.

## 2022-02-25 NOTE — Care Management (Signed)
    Durable Medical Equipment  (From admission, onward)           Start     Ordered   02/25/22 1010  For home use only DME Hospital bed  Once       Question Answer Comment  Length of Need Lifetime   Patient has (list medical condition): Bacteremia , Liver Failure   The above medical condition requires: Patient requires the ability to reposition frequently   Head must be elevated greater than: 30 degrees   Bed type Semi-electric   Support Surface: Gel Overlay      02/25/22 1010   02/24/22 1152  For home use only DME Walker rolling  Once       Question Answer Comment  Walker: With [place]   Patient needs a walker to treat with the following condition Bacteremia      02/24/22 1151   02/23/22 1324  For home use only DME 3 n 1  Once       Comments: Pt is limited to bedroom and will be alone for periods of 1-3 hours during the daytime and unable to ambulate safe distance to bathroom. Pt is able to complete stand pivot transfers to safely use BSC in bedroom.   02/23/22 1326

## 2022-02-25 NOTE — Progress Notes (Signed)
Occupational Therapy Treatment Patient Details Name: Marc Ponce MRN: [id number] DOB: 15-Nov-1955 Today's Date: 02/25/2022   History of present illness Patient is a 66 year old male admitted 02/16/22 or management of low back pain and incontinence.  During hospitalization patient has had imaging which has shown cirrhosis with portal hypertension and splenomegaly with small ascites and also a moderate splenic infarct.  Patient was seen by GI and underwent endoscopic evaluation on 02/19/2022 and was found to have oozing from portal gastropathy. Patient also found to have bacteremia and so had MRI imaging which showed L4-L5 right facet effusion with some periarticular fluid collection thought to be septic facet arthritis and potential abscess in the right quadratus lumborum. Palliative care involved. PMH includes: steatohepatitis, chronic alcohol use, HTN, hyperlipidemia, GERD, and diverticulosis   OT comments  Pt making good progress toward all goals and cognitively is much better today.  Pt continues to feel weak and requires min assist with most adls. Pt has not been OOB since two days ago. Encourage getting up in chair as much as possible and consulted mobility team.  Will continue to see for adls and functional mobility.    Recommendations for follow up therapy are one component of a multi-disciplinary discharge planning process, led by the attending physician.  Recommendations may be updated based on patient status, additional functional criteria and insurance authorization.    Follow Up Recommendations  Home health OT    Assistance Recommended at Discharge Frequent or constant Supervision/Assistance  Patient can return home with the following  A little help with walking and/or transfers;A lot of help with bathing/dressing/bathroom;Assistance with cooking/housework;Direct supervision/assist for medications management;Assist for transportation;Help with stairs or ramp for entrance    Equipment Recommendations  BSC/3in1    Recommendations for Other Services      Precautions / Restrictions Precautions Precautions: Fall Restrictions Weight Bearing Restrictions: No       Mobility Bed Mobility Overal bed mobility: Needs Assistance Bed Mobility: Supine to Sit     Supine to sit: Min assist, HOB elevated     General bed mobility comments: cues for sequencing and to do for himself using bedrails.    Transfers Overall transfer level: Needs assistance Equipment used: Rolling walker (2 wheels) Transfers: Sit to/from Stand, Bed to chair/wheelchair/BSC Sit to Stand: Min guard     Step pivot transfers: Min guard     General transfer comment: Pt appears more sure of himself moving today. Has not been up in two days. Will rec mobility team     Balance Overall balance assessment: Needs assistance Sitting-balance support: No upper extremity supported, Feet supported Sitting balance-Leahy Scale: Good     Standing balance support: Bilateral upper extremity supported, Single extremity supported Standing balance-Leahy Scale: Fair Standing balance comment: Pt could let go of walker for moments to pull up pull up and manage clothing with no LOB                           ADL either performed or assessed with clinical judgement   ADL Overall ADL's : Needs assistance/impaired Eating/Feeding: Set up;Sitting   Grooming: Wash/dry hands;Wash/dry face;Min guard;Standing Grooming Details (indicate cue type and reason): stood at [employer] Transfer: Min guard;Stand-pivot;BSC/3in1;Rolling walker (2 wheels) Toilet Transfer Details (indicate cue type and reason): assist for lines Toileting- Clothing Manipulation and Hygiene: Minimal assistance;Sit to/from stand;Cueing for compensatory techniques Toileting -  Clothing Manipulation Details (indicate cue type and reason): Pt leaned side to side to clan self. Pt with purewick. Pt can feel when  he has to urinate and would do fine with a urinal.     Functional mobility during ADLs: Min guard;Rolling walker (2 wheels) General ADL Comments: Pt gets SOB with activity but moving much better and cognition is clearing.    Extremity/Trunk Assessment Upper Extremity Assessment Upper Extremity Assessment: Overall WFL for tasks assessed            Vision   Vision Assessment?: No apparent visual deficits   Perception Perception Perception: Within Functional Limits   Praxis Praxis Praxis: Intact    Cognition Arousal/Alertness: Awake/alert Behavior During Therapy: WFL for tasks assessed/performed Overall Cognitive Status: Within Functional Limits for tasks assessed                                 General Comments: Pt much improved cognitivly        Exercises      Shoulder Instructions       General Comments Pt making good progress with adls. Talked to nursing about getting rid of purewick    Pertinent Vitals/ Pain       Pain Assessment Pain Assessment: Faces Faces Pain Scale: Hurts little more Pain Location: back Pain Descriptors / Indicators: Sore Pain Intervention(s): Monitored during session  Home Living                                          Prior Functioning/Environment              Frequency  Min 2X/week        Progress Toward Goals  OT Goals(current goals can now be found in the care plan section)  Progress towards OT goals: Progressing toward goals  Acute Rehab OT Goals Patient Stated Goal: to get home OT Goal Formulation: With patient Time For Goal Achievement: 03/09/22 Potential to Achieve Goals: Good ADL Goals Pt Will Perform Grooming: with supervision;standing Pt Will Perform Upper Body Dressing: with supervision;sitting Pt Will Perform Lower Body Dressing: with min guard assist;sit to/from stand Pt Will Transfer to Toilet: with supervision;ambulating Pt Will Perform Toileting - Clothing  Manipulation and hygiene: with supervision;sit to/from stand Additional ADL Goal #1: Pt wil verbalize at least 3 ways of conserving energy during ADL without cues  Plan Discharge plan remains appropriate    Co-evaluation                 AM-PAC OT "6 Clicks" Daily Activity     Outcome Measure   Help from another person eating meals?: None Help from another person taking care of personal grooming?: None Help from another person toileting, which includes using toliet, bedpan, or urinal?: A Little Help from another person bathing (including washing, rinsing, drying)?: A Little Help from another person to put on and taking off regular upper body clothing?: A Little Help from another person to put on and taking off regular lower body clothing?: A Little 6 Click Score: 20    End of Session Equipment Utilized During Treatment: Rolling walker (2 wheels)  OT Visit Diagnosis: Unsteadiness on feet (R26.81);Muscle weakness (generalized) (M62.81);Adult, failure to thrive (R62.7);History of falling (Z91.81)   Activity Tolerance Patient tolerated treatment well   Patient Left in chair;with call bell/phone  within reach;with chair alarm set;with family/visitor present   Nurse Communication Mobility status;Other (comment) (revmove purewick)        Time: 7253-6644 OT Time Calculation (min): 30 min  Charges: OT General Charges $OT Visit: 1 Visit OT Treatments $Self Care/Home Management : 23-37 mins    Glenford Peers 02/25/2022, 12:32 PM

## 2022-02-25 NOTE — TOC Progression Note (Addendum)
Transition of Care [facility]) - Progression Note    Patient Details  Name: Marc Ponce MRN: [id number] Date of Birth: 1955-08-01  Transition of Care [facility]) CM/SW [place], LCSW Phone Number: 02/25/2022, 11:18 AM  Clinical Narrative:    Hospital bed has been ordered through [employer] and will be delivered to pt's home prior to discharge.   Update 3:00pm: CSW spoke with pt's wife regarding outpatient palliative care. She does not have a preference for what hospice agency is used. A referral has been made to [facility] for outpatient palliative care.   Expected Discharge Plan: Home/Self Care Barriers to Discharge: No Barriers Identified  Expected Discharge Plan and Services Expected Discharge Plan: Home/Self Care In-house Referral: NA Discharge Planning Services: CM Consult Post Acute Care Choice: NA Living arrangements for the past 2 months: Single Family Home                 DME Arranged: Bedside commode, Walker rolling DME Agency: [employer] Date DME Agency Contacted: 02/24/22 Time DME Agency Contacted: 1100 Representative spoke with at DME Agency: [place]: PT, OT [place] Agency: [place] Date [place]: 02/24/22 Time [place]: 1630 Representative spoke with at [place]: [place] Determinants of Health ([place]) Interventions Housing Interventions: Patient Refused  Readmission Risk Interventions     No data to display

## 2022-02-25 NOTE — Progress Notes (Signed)
Physical Therapy Treatment Patient Details Name: DELRICO MINEHART MRN: [id number] DOB: 10/13/1955 Today's Date: 02/25/2022   History of Present Illness Patient is a 66 year old male admitted 02/16/22 or management of low back pain and incontinence.  During hospitalization patient has had imaging which has shown cirrhosis with portal hypertension and splenomegaly with small ascites and also a moderate splenic infarct.  Patient was seen by GI and underwent endoscopic evaluation on 02/19/2022 and was found to have oozing from portal gastropathy. Patient also found to have bacteremia and so had MRI imaging which showed L4-L5 right facet effusion with some periarticular fluid collection thought to be septic facet arthritis and potential abscess in the right quadratus lumborum. Palliative care involved. PMH includes: steatohepatitis, chronic alcohol use, HTN, hyperlipidemia, GERD, and diverticulosis    PT Comments    Patient making excellent progress with mobility and highly motivated to increase activity. Pt required guarding for safety with sit<>stand and initially min assist for gait with RW. As gait progressed pt moved to min guard and was able to increase step length with smooth step through pattern keeping safe position to RW. Pt was able to stand at sink with intermittent counter support while washing face and performing oral hygiene. Encouraged pt to begin mobilizing to bathroom with assist of RN/NT staff. Will continue to progress pt as able with plan to return home with HHPT and assist from spouse.    Recommendations for follow up therapy are one component of a multi-disciplinary discharge planning process, led by the attending physician.  Recommendations may be updated based on patient status, additional functional criteria and insurance authorization.  Follow Up Recommendations  Home health PT     Assistance Recommended at Discharge Frequent or constant Supervision/Assistance  Patient can  return home with the following A lot of help with walking and/or transfers;A lot of help with bathing/dressing/bathroom;Assistance with cooking/housework;Direct supervision/assist for medications management;Assist for transportation;Help with stairs or ramp for entrance   Equipment Recommendations  Rolling walker (2 wheels);BSC/3in1    Recommendations for Other Services       Precautions / Restrictions Precautions Precautions: Fall Restrictions Weight Bearing Restrictions: No     Mobility  Bed Mobility Overal bed mobility: Needs Assistance Bed Mobility: Sit to Supine       Sit to supine: Min assist, HOB elevated   General bed mobility comments: cues to initiate, assist to bring bil LE's onto bed. pt able to use head board rail to scoot superior in bed.    Transfers Overall transfer level: Needs assistance Equipment used: Rolling walker (2 wheels) Transfers: Sit to/from Stand Sit to Stand: Min guard           General transfer comment: pt using bil UE's to power up from recliner. guarding for safety    Ambulation/Gait Ambulation/Gait assistance: Min assist, Min guard Gait Distance (Feet): 200 Feet Assistive device: Rolling walker (2 wheels) Gait Pattern/deviations: Step-through pattern, Decreased stride length Gait velocity: decr     General Gait Details: gait slow and cautious at start but pt's confidence imrpovd thorughout and stride length increased with smooth pattern. Pt maintained safe position to RW, no LOB. intermittent cues to avoid obstacles in hallway.   Stairs             Wheelchair Mobility    Modified Rankin (Stroke Patients Only)       Balance Overall balance assessment: Needs assistance Sitting-balance support: No upper extremity supported, Feet supported Sitting balance-Leahy Scale: Good     Standing  balance support: Bilateral upper extremity supported, Single extremity supported Standing balance-Leahy Scale: Fair Standing  balance comment: pt able to stand at sink to brush teeth and wash face.                            Cognition Arousal/Alertness: Awake/alert Behavior During Therapy: WFL for tasks assessed/performed Overall Cognitive Status: Within Functional Limits for tasks assessed                                 General Comments: Pt much improved cognitivly. pt recalls working with PT/OT 2 days ago and joked the OT that day sang a lot. pt states "she left her money on the table". pt able to follow all cues and verbalized understanding. pt asking appropriate questions and engaged in his care.        Exercises      General Comments        Pertinent Vitals/Pain Pain Assessment Pain Assessment: No/denies pain Pain Intervention(s): Monitored during session, Limited activity within patient's tolerance    Home Living                          Prior Function            PT Goals (current goals can now be found in the care plan section) Acute Rehab PT Goals PT Goal Formulation: With patient/family Time For Goal Achievement: 03/09/22 Potential to Achieve Goals: Good Progress towards PT goals: Progressing toward goals    Frequency    Min 3X/week      PT Plan Current plan remains appropriate    Co-evaluation              AM-PAC PT "6 Clicks" Mobility   Outcome Measure  Help needed turning from your back to your side while in a flat bed without using bedrails?: A Little Help needed moving from lying on your back to sitting on the side of a flat bed without using bedrails?: A Little Help needed moving to and from a bed to a chair (including a wheelchair)?: A Little Help needed standing up from a chair using your arms (e.g., wheelchair or bedside chair)?: A Little Help needed to walk in hospital room?: A Little Help needed climbing 3-5 steps with a railing? : A Lot 6 Click Score: 17    End of Session Equipment Utilized During Treatment: Gait  belt Activity Tolerance: Patient tolerated treatment well Patient left: in chair;with call bell/phone within reach;with chair alarm set;with family/visitor present Nurse Communication: Mobility status PT Visit Diagnosis: Unsteadiness on feet (R26.81);Other abnormalities of gait and mobility (R26.89);Muscle weakness (generalized) (M62.81);Difficulty in walking, not elsewhere classified (R26.2)     Time: 0233-4356 PT Time Calculation (min) (ACUTE ONLY): 33 min  Charges:  $Gait Training: 8-22 mins $Therapeutic Activity: 8-22 mins                     Verner Mould, DPT Acute Rehabilitation Services Office [phone]  02/25/22 4:33 PM

## 2022-02-25 NOTE — Progress Notes (Signed)
PROGRESS NOTE    Marc Ponce  VPX:[id number] DOB: 07-28-1955 DOA: 02/16/2022 PCP: [name], MD    Brief Narrative:  66 year old with steatohepatitis, chronic alcohol use, hypertension, hyperlipidemia, GERD presented to ER with increasing low back pain and incontinence.  Had recently stopped drinking.  He does have chronic liver disease with bilirubin 4-5 range.  In the emergency room creatinine 2, total bili 8, WBC count 18.   CT scan showed splenic infarct, subsequent MRI showed L4/L5 right facet effusion and periarticular fluid consistent with abscess.  10/18 blood culture positive for staph lugdunensis 10/21, blood cultures negative. Remained very sick with hepatic encephalopathy and confusion, he did ultimately stabilized and improved.  Assessment and plan.  Principal Problem:   Splenic infarction Active Problems:   Alcohol abuse, daily use   HTN (hypertension)   Hyperlipidemia   Elevated LFTs   GERD (gastroesophageal reflux disease)   Cirrhosis (HCC)   Leukocytosis   AKI (acute kidney injury) ([place])   Splenic infarct   Thrombocytopenia (HCC)   Melena   Acute blood loss anemia   Secondary esophageal varices without bleeding (HCC)   Portal hypertensive gastropathy (HCC)   Ulcer of esophagus without bleeding   Acute hepatic encephalopathy (HCC)   Bacteremia   Intra-abdominal abscess (HCC)   Weakness   Goals of care, counseling/discussion   Palliative care encounter   Counseling and coordination of care   Vertebral osteomyelitis ([place])    Sepsis present on admission secondary to bacteremia from staph lugdunensis L4-L5 facet septic arthritis with no cord compression, nonsurgical Quadratus lumborum abscess, not big enough to drain. Splenic abscess. Blood cultures 10/18 positive.  10/21 negative.  Clinically improving.  Repeat CT scan abdomen pelvis shows stable size of splenic collection and splenic infarct.  After different antibiotics, currently remains  on cefazolin.  Followed by ID.  Anticipate long-term IV antibiotics.  Antibiotics plan not finalized yet.  Splenic infarct Noted to have splenic infarct on CT scan of the abdomen. Due to GI bleed,  Heparin drip was discontinued.  2D echocardiogram did not show any vegetation.  Not a candidate for anticoagulation at this time. Repeat CT scan showed stable infarct.  AKI Normalized.  Will discontinue losartan hydrochlorothiazide.   Alcoholic cirrhosis of liver with melena and bright red blood.   underwent EGD 02/19/2022 with findings of single column of esophageal varices, small esophageal ulcer which was clean-based, mild portal hypertensive gastropathy with contact oozing.  Stabilizing.  Hemoglobin has remained stable since initial drop. Changed to oral Protonix. Discontinue Flexi-Seal. Change lactulose to 2 times daily. Mental status is improving. Discontinue telemetry monitor to help mobilize around with PT OT and family.  Acute blood loss anemia hemoglobin is stable.  Alcohol use with mild withdrawal. Was a  heavy drinker.  Stopped drinking after February 09, 2022.  No withdrawal symptoms.  No more needing benzodiazepine.  Remains on multivitamins.   Hypertension Continue to hold losartan and HCTZ.  Continue metoprolol.  Stable.   DVT prophylaxis: Place and maintain sequential compression device Start: 02/19/22 1325      Code Status:     Code Status: Full Code with full scope of treatment.  Disposition: Home with home health.  Anticipate next 48 hours.  He will need home health PT OT, he will benefit with hospital bed.  Status is: Inpatient  The patient is inpatient because: GI bleed, cirrhosis of liver, splenic infarct, hepatic encephalopathy, status post endoscopy On IV antibiotics   Family Communication:  Wife  at the bedside.  Consultants:  GI Infectious disease. Oncology Dr. Rica Records Palliative care  Procedures:  Upper GI endoscopy with  biopsy  Antimicrobials:  Cefazolin, rifaximin  Subjective:  Patient seen and examined.  No overnight events.  Denies any nausea vomiting abdominal pain or bloating.  No bowel movement since yesterday morning.  Mental status has improved.  Motivated to walk.  Objective: Vitals:   02/24/22 0437 02/24/22 0500 02/24/22 1504 02/25/22 0500  BP: (!) 132/56  135/60 122/68  Pulse: 78  79 76  Resp: 16  18 18   Temp: 98.2 F (36.8 C)  98.1 F (36.7 C) 98.4 F (36.9 C)  TempSrc: Oral  Oral Oral  SpO2: 97%  97% 99%  Weight:  97.8 kg  101 kg  Height:        Intake/Output Summary (Last 24 hours) at 02/25/2022 1052 Last data filed at 02/25/2022 0943 Gross per 24 hour  Intake 118 ml  Output 700 ml  Net -582 ml   Filed Weights   02/23/22 0526 02/24/22 0500 02/25/22 0500  Weight: 98.7 kg 97.8 kg 101 kg    Physical Examination: Body mass index is 34.87 kg/m.   General: Looks very comfortable today.  Muddy sclera. Cardiovascular: S1-S2 normal.  Regular rate rhythm. Respiratory: Bilateral clear with some upper airway sounds.  No other added sounds. Gastrointestinal: Soft.  Nontender.  Distended.  Bowel sound present.   Ext: No edema.  No cyanosis.  No clubbing. Neuro: Alert awake oriented.  Moves all extremities.  No asterixis.  No tremors. Musculoskeletal: No deformities. Skin: Intact.  Data Reviewed:   CBC: Recent Labs  Lab 02/21/22 0505 02/22/22 0505 02/23/22 0446 02/24/22 0455 02/25/22 0530  WBC 11.8* 14.8* 16.0* 14.9* 14.2*  NEUTROABS  --  11.8* 12.7* 11.9* 11.1*  HGB 7.2* 7.3* 7.2* 7.1* 7.5*  HCT 22.6* 22.9* 22.5* 22.2* 23.0*  MCV 103.7* 106.0* 105.1* 105.7* 106.5*  PLT 83* 89* 103* 102* 117*    Basic Metabolic Panel: Recent Labs  Lab 02/19/22 0606 02/20/22 0553 02/21/22 0505 02/22/22 0505 02/23/22 0446 02/24/22 0455 02/25/22 0530  NA 137 145 145 143 139 137 134*  K 3.9 3.8 3.9 3.5 4.0 4.2 4.5  CL 112* 117* 117* 115* 113* 112* 110  CO2 18* 20* 21* 20*  21* 20* 19*  GLUCOSE 123* 118* 121* 117* 109* 118* 97  BUN 91* 83* 61* 46* 37* 34* 31*  CREATININE 1.83* 1.48* 1.26* 1.18 1.06 1.07 0.98  CALCIUM 8.0* 8.7* 8.9 8.9 8.6* 8.8* 8.5*  MG 3.0* 2.6* 2.5* 2.0  --   --   --   PHOS  --   --   --  2.1* 2.6  --   --     Liver Function Tests: Recent Labs  Lab 02/21/22 0505 02/22/22 0505 02/23/22 0446 02/24/22 0455 02/25/22 0530  AST 41 35 34 36 39  ALT 14 11 11 11 11   ALKPHOS 86 81 87 88 90  BILITOT 5.4* 5.7* 5.2* 4.5* 3.8*  PROT 6.8 6.2* 6.2* 6.3* 6.5  ALBUMIN 3.6 3.0* 2.7* 2.6* 2.4*     Radiology Studies: CT ABDOMEN PELVIS W CONTRAST  Result Date: 02/24/2022 CLINICAL DATA:  Intra-abdominal abscess, staph bacteremia, cirrhosis, splenic infarct concerning for abscess formation EXAM: CT ABDOMEN AND PELVIS WITH CONTRAST TECHNIQUE: Multidetector CT imaging of the abdomen and pelvis was performed using the standard protocol following bolus administration of intravenous contrast. RADIATION DOSE REDUCTION: This exam was performed according to the departmental dose-optimization program which includes  automated exposure control, adjustment of the mA and/or kV according to patient size and/or use of iterative reconstruction technique. CONTRAST:  168m OMNIPAQUE IOHEXOL 300 MG/ML SOLN additional oral enteric contrast COMPARISON:  CT chest abdomen pelvis, 02/16/2022 FINDINGS: Lower chest: Cardiomegaly and coronary artery calcifications. Aortic valve calcifications. Moderate left pleural effusion and associated atelectasis or consolidation, increased compared to prior examination. Trace right pleural effusion, unchanged. Hepatobiliary: No solid liver abnormality is seen. Coarse, nodular cirrhotic morphology of the liver. Simple cysts of the left lobe and caudate. Small gallstones. No gallbladder wall thickening. No biliary ductal dilatation. Recanalization of the umbilical vein with very large ventral abdominal varices (series 2, image 28). Moderate size  splenic varices (series 2, image 31). Pancreas: Unremarkable. No pancreatic ductal dilatation or surrounding inflammatory changes. Spleen: Unchanged splenomegaly, maximum coronal span 16.2 cm. Unchanged large, bandlike subcapsular hypodensity of the anterior spleen (series 2, image 26). Unchanged subcapsular fluid collection of the anterolateral spleen measuring 5.9 x 1.8 cm (series 2, image 30). Adrenals/Urinary Tract: Adrenal glands are unremarkable. Kidneys are normal, without renal calculi, solid lesion, or hydronephrosis. Bladder is unremarkable. Stomach/Bowel: Stomach is within normal limits. Appendix is not clearly visualized. No evidence of bowel wall thickening, distention, or inflammatory changes. Sigmoid diverticula. Vascular/Lymphatic: Aortic atherosclerosis. No enlarged abdominal or pelvic lymph nodes. Reproductive: No mass or other significant abnormality. Other: Small, fat containing umbilical hernia. Anasarca. Moderate volume ascites, slightly increased compared to prior examination. Musculoskeletal: No acute or significant osseous findings. IMPRESSION: 1. Cirrhosis and stigmata of portal hypertension. 2. Unchanged splenomegaly. Unchanged large, bandlike subcapsular hypodensity of the anterior spleen consistent with infarction. Unchanged subcapsular fluid collection of the anterolateral spleen measuring 5.9 x 1.8 cm. Presence or absence of infection within this fluid is not established by CT. 3. Moderate volume ascites, slightly increased compared to prior examination. 4. Moderate left pleural effusion and associated atelectasis or consolidation, increased compared to prior examination. Trace right pleural effusion, unchanged. 5. Cardiomegaly and coronary artery disease. Aortic Atherosclerosis (ICD10-I70.0). Electronically Signed   By: ADelanna AhmadiM.D.   On: 02/24/2022 15:31      LOS: 8 days    KBarb Merino MD [facility] Available via Epic secure chat 7am-7pm After these hours,  please refer to coverage provider listed on [email or web address] 02/25/2022, 10:52 AM

## 2022-02-26 DIAGNOSIS — D735 Infarction of spleen: Secondary | ICD-10-CM | POA: Diagnosis not present

## 2022-02-26 DIAGNOSIS — R531 Weakness: Secondary | ICD-10-CM | POA: Diagnosis not present

## 2022-02-26 DIAGNOSIS — Z515 Encounter for palliative care: Secondary | ICD-10-CM | POA: Diagnosis not present

## 2022-02-26 DIAGNOSIS — K7460 Unspecified cirrhosis of liver: Secondary | ICD-10-CM | POA: Diagnosis not present

## 2022-02-26 LAB — CBC WITH DIFFERENTIAL/PLATELET
Abs Immature Granulocytes: 0.12 10*3/uL — ABNORMAL HIGH (ref 0.00–0.07)
Basophils Absolute: 0.1 10*3/uL (ref 0.0–0.1)
Basophils Relative: 1 %
Eosinophils Absolute: 0.2 10*3/uL (ref 0.0–0.5)
Eosinophils Relative: 2 %
HCT: 22.2 % — ABNORMAL LOW (ref 39.0–52.0)
Hemoglobin: 7.2 g/dL — ABNORMAL LOW (ref 13.0–17.0)
Immature Granulocytes: 1 %
Lymphocytes Relative: 9 %
Lymphs Abs: 1.1 10*3/uL (ref 0.7–4.0)
MCH: 34.6 pg — ABNORMAL HIGH (ref 26.0–34.0)
MCHC: 32.4 g/dL (ref 30.0–36.0)
MCV: 106.7 fL — ABNORMAL HIGH (ref 80.0–100.0)
Monocytes Absolute: 1.5 10*3/uL — ABNORMAL HIGH (ref 0.1–1.0)
Monocytes Relative: 12 %
Neutro Abs: 9.8 10*3/uL — ABNORMAL HIGH (ref 1.7–7.7)
Neutrophils Relative %: 75 %
Platelets: 130 10*3/uL — ABNORMAL LOW (ref 150–400)
RBC: 2.08 MIL/uL — ABNORMAL LOW (ref 4.22–5.81)
RDW: 20.7 % — ABNORMAL HIGH (ref 11.5–15.5)
WBC: 12.9 10*3/uL — ABNORMAL HIGH (ref 4.0–10.5)
nRBC: 0 % (ref 0.0–0.2)

## 2022-02-26 LAB — COMPREHENSIVE METABOLIC PANEL
ALT: 12 U/L (ref 0–44)
AST: 46 U/L — ABNORMAL HIGH (ref 15–41)
Albumin: 2.6 g/dL — ABNORMAL LOW (ref 3.5–5.0)
Alkaline Phosphatase: 92 U/L (ref 38–126)
Anion gap: 6 (ref 5–15)
BUN: 32 mg/dL — ABNORMAL HIGH (ref 8–23)
CO2: 20 mmol/L — ABNORMAL LOW (ref 22–32)
Calcium: 8.7 mg/dL — ABNORMAL LOW (ref 8.9–10.3)
Chloride: 108 mmol/L (ref 98–111)
Creatinine, Ser: 1.01 mg/dL (ref 0.61–1.24)
GFR, Estimated: >60 mL/min (ref >60)
Glucose, Bld: 94 mg/dL (ref 70–99)
Potassium: 4.4 mmol/L (ref 3.5–5.1)
Sodium: 134 mmol/L — ABNORMAL LOW (ref 135–145)
Total Bilirubin: 4.6 mg/dL — ABNORMAL HIGH (ref 0.3–1.2)
Total Protein: 6.6 g/dL (ref 6.5–8.1)

## 2022-02-26 LAB — MAGNESIUM: Magnesium: 1.8 mg/dL (ref 1.7–2.4)

## 2022-02-26 LAB — PHOSPHORUS: Phosphorus: 3.2 mg/dL (ref 2.5–4.6)

## 2022-02-26 MED ORDER — LINEZOLID 600 MG PO TABS: 600.0000 mg | ORAL_TABLET | Freq: Two times a day (BID) | ORAL | 0 refills | Status: AC

## 2022-02-26 MED ORDER — LACTULOSE 10 GM/15ML PO SOLN
20.0000 g | Freq: Three times a day (TID) | ORAL | Status: DC
  Administered 2022-02-26 (×3): 20 g via ORAL
  Filled 2022-02-26 (×3): qty 30

## 2022-02-26 NOTE — Progress Notes (Signed)
PROGRESS NOTE    Marc Ponce  WPV:[id number] DOB: February 15, 1956 DOA: 02/16/2022 PCP: [name], MD    Brief Narrative:  66 year old with steatohepatitis, chronic alcohol use, hypertension, hyperlipidemia, GERD presented to ER with increasing low back pain and incontinence.  Had recently stopped drinking.  He does have chronic liver disease with bilirubin 4-5 range.  In the emergency room creatinine 2, total bili 8, WBC count 18.   CT scan showed splenic infarct, subsequent MRI showed L4/L5 right facet effusion and periarticular fluid consistent with abscess.  10/18 blood culture positive for staph lugdunensis 10/21, blood cultures negative. Remained very sick with hepatic encephalopathy and confusion, he did ultimately stabilized and improving now.  Assessment and plan.  Principal Problem:   Splenic infarction Active Problems:   Alcohol abuse, daily use   HTN (hypertension)   Hyperlipidemia   Elevated LFTs   GERD (gastroesophageal reflux disease)   Cirrhosis (HCC)   Leukocytosis   AKI (acute kidney injury) ([place])   Splenic infarct   Thrombocytopenia (HCC)   Melena   Acute blood loss anemia   Secondary esophageal varices without bleeding (HCC)   Portal hypertensive gastropathy (HCC)   Ulcer of esophagus without bleeding   Acute hepatic encephalopathy (HCC)   Bacteremia   Intra-abdominal abscess (HCC)   Weakness   Goals of care, counseling/discussion   Palliative care encounter   Counseling and coordination of care   Vertebral osteomyelitis ([place])    Sepsis present on admission secondary to bacteremia from staph lugdunensis L4-L5 facet septic arthritis with no cord compression, nonsurgical Quadratus lumborum abscess, not big enough to drain. Splenic abscess. Blood cultures 10/18 positive.   Blood cultures 10/21 negative.  Clinically improving.  Repeat CT scan abdomen pelvis shows stable size of splenic collection and splenic infarct.  After different  antibiotics, currently remains on cefazolin.  Followed by ID.   With clinical recovery, ID recommended 4 weeks of Zyvox on discharge.  Splenic infarct Noted to have splenic infarct on CT scan of the abdomen.  Initially anticoagulated but he stopped due to GI bleeding. 2D echocardiogram did not show any vegetation.  Not a candidate for anticoagulation at this time. Repeat CT scan showed stable infarct.  AKI Normalized.    Alcoholic cirrhosis of liver with melena and bright red blood.   underwent EGD 02/19/2022 with findings of single column of esophageal varices, small esophageal ulcer which was clean-based, mild portal hypertensive gastropathy with contact oozing.  Stabilizing.  Hemoglobin has remained stable since initial drop. Changed to oral Protonix. Lactulose 20 g twice daily, no bowel movement.  Increase to 3 times daily.  Mental status has improved and normalized. Will discharge home on metoprolol, rifaximin and lactulose.  Acute blood loss anemia hemoglobin is stable.  Alcohol use with mild withdrawal. Was a  heavy drinker.  Stopped drinking after February 09, 2022.  No withdrawal symptoms.  No more needing benzodiazepine.  Remains on multivitamins.  Motivated to quit.  Hypertension Continue to hold losartan and HCTZ.  Continue metoprolol.  Stable.   DVT prophylaxis: Place and maintain sequential compression device Start: 02/19/22 1325      Code Status:     Code Status: Full Code with full scope of treatment.  Disposition: Home with home health.  Anticipate discharge home tomorrow morning family is ready to help him and devices are available at home.  Status is: Inpatient  The patient is inpatient because: on IV antibiotics   Family Communication:  Wife at the bedside.  Consultants:  GI Infectious disease. Oncology Dr. Rica Records Palliative care  Procedures:  Upper GI endoscopy with biopsy  Antimicrobials:  Cefazolin, rifaximin  Subjective:  Patient  seen and examined.  Denies any overnight events.  He was sodium lactate to be able to walk in the hallway with a walker and with help of mobility technician.  Denies any nausea vomiting.  Appropriate questions were answered.  He had no bowel movement since last 24 hours despite taking lactulose 2 times.  Objective: Vitals:   02/25/22 2031 02/26/22 0333 02/26/22 0500 02/26/22 0533  BP: (!) 125/50   (!) 120/56  Pulse: 72   70  Resp: 20   18  Temp: 98.3 F (36.8 C)   (!) 97.5 F (36.4 C)  TempSrc: Oral   Oral  SpO2: 97%   97%  Weight:  102.4 kg 101.1 kg   Height:        Intake/Output Summary (Last 24 hours) at 02/26/2022 1129 Last data filed at 02/26/2022 0830 Gross per 24 hour  Intake 1080 ml  Output 1000 ml  Net 80 ml   Filed Weights   02/25/22 0500 02/26/22 0333 02/26/22 0500  Weight: 101 kg 102.4 kg 101.1 kg    Physical Examination: Body mass index is 34.91 kg/m.   General: Looks very comfortable today.  Walking around in the hallway with a walker and needing minimal help. Cardiovascular: S1-S2 normal.  Regular rate rhythm. Respiratory: Bilateral clear with some upper airway sounds.  No other added sounds. Gastrointestinal: Soft.  Nontender.  Distended.  Bowel sound present.   Ext: No edema.  No cyanosis.  No clubbing. Neuro: Alert awake oriented.  Moves all extremities.  No asterixis.  No tremors. Musculoskeletal: No deformities. Skin: Intact.  Data Reviewed:   CBC: Recent Labs  Lab 02/22/22 0505 02/23/22 0446 02/24/22 0455 02/25/22 0530 02/26/22 0536  WBC 14.8* 16.0* 14.9* 14.2* 12.9*  NEUTROABS 11.8* 12.7* 11.9* 11.1* 9.8*  HGB 7.3* 7.2* 7.1* 7.5* 7.2*  HCT 22.9* 22.5* 22.2* 23.0* 22.2*  MCV 106.0* 105.1* 105.7* 106.5* 106.7*  PLT 89* 103* 102* 117* 130*    Basic Metabolic Panel: Recent Labs  Lab 02/20/22 0553 02/21/22 0505 02/22/22 0505 02/23/22 0446 02/24/22 0455 02/25/22 0530 02/26/22 0536  NA 145 145 143 139 137 134* 134*  K 3.8 3.9 3.5  4.0 4.2 4.5 4.4  CL 117* 117* 115* 113* 112* 110 108  CO2 20* 21* 20* 21* 20* 19* 20*  GLUCOSE 118* 121* 117* 109* 118* 97 94  BUN 83* 61* 46* 37* 34* 31* 32*  CREATININE 1.48* 1.26* 1.18 1.06 1.07 0.98 1.01  CALCIUM 8.7* 8.9 8.9 8.6* 8.8* 8.5* 8.7*  MG 2.6* 2.5* 2.0  --   --   --  1.8  PHOS  --   --  2.1* 2.6  --   --  3.2    Liver Function Tests: Recent Labs  Lab 02/22/22 0505 02/23/22 0446 02/24/22 0455 02/25/22 0530 02/26/22 0536  AST 35 34 36 39 46*  ALT 11 11 11 11 12   ALKPHOS 81 87 88 90 92  BILITOT 5.7* 5.2* 4.5* 3.8* 4.6*  PROT 6.2* 6.2* 6.3* 6.5 6.6  ALBUMIN 3.0* 2.7* 2.6* 2.4* 2.6*     Radiology Studies: CT ABDOMEN PELVIS W CONTRAST  Result Date: 02/24/2022 CLINICAL DATA:  Intra-abdominal abscess, staph bacteremia, cirrhosis, splenic infarct concerning for abscess formation EXAM: CT ABDOMEN AND PELVIS WITH CONTRAST TECHNIQUE: Multidetector CT imaging of the abdomen and pelvis was performed using  the standard protocol following bolus administration of intravenous contrast. RADIATION DOSE REDUCTION: This exam was performed according to the departmental dose-optimization program which includes automated exposure control, adjustment of the mA and/or kV according to patient size and/or use of iterative reconstruction technique. CONTRAST:  174m OMNIPAQUE IOHEXOL 300 MG/ML SOLN additional oral enteric contrast COMPARISON:  CT chest abdomen pelvis, 02/16/2022 FINDINGS: Lower chest: Cardiomegaly and coronary artery calcifications. Aortic valve calcifications. Moderate left pleural effusion and associated atelectasis or consolidation, increased compared to prior examination. Trace right pleural effusion, unchanged. Hepatobiliary: No solid liver abnormality is seen. Coarse, nodular cirrhotic morphology of the liver. Simple cysts of the left lobe and caudate. Small gallstones. No gallbladder wall thickening. No biliary ductal dilatation. Recanalization of the umbilical vein with very  large ventral abdominal varices (series 2, image 28). Moderate size splenic varices (series 2, image 31). Pancreas: Unremarkable. No pancreatic ductal dilatation or surrounding inflammatory changes. Spleen: Unchanged splenomegaly, maximum coronal span 16.2 cm. Unchanged large, bandlike subcapsular hypodensity of the anterior spleen (series 2, image 26). Unchanged subcapsular fluid collection of the anterolateral spleen measuring 5.9 x 1.8 cm (series 2, image 30). Adrenals/Urinary Tract: Adrenal glands are unremarkable. Kidneys are normal, without renal calculi, solid lesion, or hydronephrosis. Bladder is unremarkable. Stomach/Bowel: Stomach is within normal limits. Appendix is not clearly visualized. No evidence of bowel wall thickening, distention, or inflammatory changes. Sigmoid diverticula. Vascular/Lymphatic: Aortic atherosclerosis. No enlarged abdominal or pelvic lymph nodes. Reproductive: No mass or other significant abnormality. Other: Small, fat containing umbilical hernia. Anasarca. Moderate volume ascites, slightly increased compared to prior examination. Musculoskeletal: No acute or significant osseous findings. IMPRESSION: 1. Cirrhosis and stigmata of portal hypertension. 2. Unchanged splenomegaly. Unchanged large, bandlike subcapsular hypodensity of the anterior spleen consistent with infarction. Unchanged subcapsular fluid collection of the anterolateral spleen measuring 5.9 x 1.8 cm. Presence or absence of infection within this fluid is not established by CT. 3. Moderate volume ascites, slightly increased compared to prior examination. 4. Moderate left pleural effusion and associated atelectasis or consolidation, increased compared to prior examination. Trace right pleural effusion, unchanged. 5. Cardiomegaly and coronary artery disease. Aortic Atherosclerosis (ICD10-I70.0). Electronically Signed   By: ADelanna AhmadiM.D.   On: 02/24/2022 15:31      LOS: 9 days    KBarb Merino MD [facility] Available via Epic secure chat 7am-7pm After these hours, please refer to coverage provider listed on [email or web address] 02/26/2022, 11:29 AM

## 2022-02-26 NOTE — Progress Notes (Signed)
Mobility Specialist - Progress Note   02/26/22 0938  Mobility  Activity Ambulated with assistance in hallway  Level of Assistance Standby assist, set-up cues, supervision of patient - no hands on  Assistive Device Front wheel walker  Distance Ambulated (ft) 200 ft  Activity Response Tolerated well  Mobility Referral Yes  $Mobility charge 1 Mobility   Pt received in chair and agreed to mobility, some pain in L knee. Some joint stiffness but no "internal pain". Pt back to chair with all needs met requesting another session.   Roderick Pee Mobility Specialist

## 2022-02-27 DIAGNOSIS — R531 Weakness: Secondary | ICD-10-CM | POA: Diagnosis not present

## 2022-02-27 DIAGNOSIS — Z515 Encounter for palliative care: Secondary | ICD-10-CM | POA: Diagnosis not present

## 2022-02-27 DIAGNOSIS — D735 Infarction of spleen: Secondary | ICD-10-CM | POA: Diagnosis not present

## 2022-02-27 DIAGNOSIS — K7460 Unspecified cirrhosis of liver: Secondary | ICD-10-CM | POA: Diagnosis not present

## 2022-02-27 LAB — CBC WITH DIFFERENTIAL/PLATELET
Abs Immature Granulocytes: 0.13 10*3/uL — ABNORMAL HIGH (ref 0.00–0.07)
Basophils Absolute: 0.1 10*3/uL (ref 0.0–0.1)
Basophils Relative: 0 %
Eosinophils Absolute: 0.3 10*3/uL (ref 0.0–0.5)
Eosinophils Relative: 2 %
HCT: 21.7 % — ABNORMAL LOW (ref 39.0–52.0)
Hemoglobin: 6.9 g/dL — CL (ref 13.0–17.0)
Immature Granulocytes: 1 %
Lymphocytes Relative: 8 %
Lymphs Abs: 1 10*3/uL (ref 0.7–4.0)
MCH: 34.2 pg — ABNORMAL HIGH (ref 26.0–34.0)
MCHC: 31.8 g/dL (ref 30.0–36.0)
MCV: 107.4 fL — ABNORMAL HIGH (ref 80.0–100.0)
Monocytes Absolute: 1.4 10*3/uL — ABNORMAL HIGH (ref 0.1–1.0)
Monocytes Relative: 11 %
Neutro Abs: 10 10*3/uL — ABNORMAL HIGH (ref 1.7–7.7)
Neutrophils Relative %: 78 %
Platelets: 133 10*3/uL — ABNORMAL LOW (ref 150–400)
RBC: 2.02 MIL/uL — ABNORMAL LOW (ref 4.22–5.81)
RDW: 20.9 % — ABNORMAL HIGH (ref 11.5–15.5)
WBC: 12.9 10*3/uL — ABNORMAL HIGH (ref 4.0–10.5)
nRBC: 0 % (ref 0.0–0.2)

## 2022-02-27 LAB — PREPARE RBC (CROSSMATCH)

## 2022-02-27 LAB — HEMOGLOBIN AND HEMATOCRIT, BLOOD
HCT: 24.8 % — ABNORMAL LOW (ref 39.0–52.0)
Hemoglobin: 8.1 g/dL — ABNORMAL LOW (ref 13.0–17.0)

## 2022-02-27 MED ORDER — SODIUM CHLORIDE 0.9% IV SOLUTION: Freq: Once | INTRAVENOUS | Status: AC

## 2022-02-27 MED ORDER — LACTULOSE 10 GM/15ML PO SOLN
30.0000 g | Freq: Three times a day (TID) | ORAL | Status: DC
  Administered 2022-02-27 – 2022-02-28 (×4): 30 g via ORAL
  Filled 2022-02-27 (×4): qty 45

## 2022-02-27 NOTE — TOC Progression Note (Signed)
Transition of Care [facility]) - Progression Note    Patient Details  Name: Marc Ponce MRN: [id number] Date of Birth: 1956/05/01  [facility] CM/SW Contact  Henrietta Dine, RN Phone Number: 02/27/2022, 10:27 AM  Clinical Narrative:    Notified by Estill Bamberg, RN that pt is to d/c home with a hospital bed; spoke with Annie Jeffrey [facility] at [place], and she says a form a payment is required for delivery; she was given pt's wife contact info [phone]) and [phone] home); Delana Meyer will contact his wife and arrange delivery.   Expected Discharge Plan: Home/Self Care Barriers to Discharge: No Barriers Identified  Expected Discharge Plan and Services Expected Discharge Plan: Home/Self Care In-house Referral: NA Discharge Planning Services: CM Consult Post Acute Care Choice: NA Living arrangements for the past 2 months: Single Family Home                 DME Arranged: Bedside commode, Walker rolling DME Agency: [employer] Date DME Agency Contacted: 02/24/22 Time DME Agency Contacted: 1100 Representative spoke with at DME Agency: [place]: PT, OT [place] Agency: [place] Date [place]: 02/24/22 Time [place]: 1630 Representative spoke with at [place]: [place] Determinants of Health ([place]) Interventions Housing Interventions: Patient Refused  Readmission Risk Interventions     No data to display

## 2022-02-27 NOTE — Progress Notes (Signed)
PROGRESS NOTE    Marc Ponce  GEZ:[id number] DOB: December 21, 1955 DOA: 02/16/2022 PCP: [name], MD    Brief Narrative:  66 year old with steatohepatitis, chronic alcohol use, hypertension, hyperlipidemia, GERD presented to ER with increasing low back pain and incontinence.  Had recently stopped drinking.  He does have chronic liver disease with bilirubin 4-5 range.  In the emergency room creatinine 2, total bili 8, WBC count 18.   CT scan showed splenic infarct, subsequent MRI showed L4/L5 right facet effusion and periarticular fluid consistent with abscess.  10/18 blood culture positive for staph lugdunensis 10/21, blood cultures negative. Remained very sick with hepatic encephalopathy and confusion, he did ultimately stabilized and improving now.  Assessment and plan.  Principal Problem:   Splenic infarction Active Problems:   Alcohol abuse, daily use   HTN (hypertension)   Hyperlipidemia   Elevated LFTs   GERD (gastroesophageal reflux disease)   Cirrhosis (HCC)   Leukocytosis   AKI (acute kidney injury) ([name])   Splenic infarct   Thrombocytopenia (HCC)   Melena   Acute blood loss anemia   Secondary esophageal varices without bleeding (HCC)   Portal hypertensive gastropathy (HCC)   Ulcer of esophagus without bleeding   Acute hepatic encephalopathy (HCC)   Bacteremia   Intra-abdominal abscess (HCC)   Weakness   Goals of care, counseling/discussion   Palliative care encounter   Counseling and coordination of care   Vertebral osteomyelitis ([place])    Sepsis present on admission secondary to bacteremia from staph lugdunensis L4-L5 facet septic arthritis with no cord compression, nonsurgical Quadratus lumborum abscess, not big enough to drain. Splenic abscess. Blood cultures 10/18 positive.   Blood cultures 10/21 negative.  Clinically improving.  Repeat CT scan abdomen pelvis shows stable size of splenic collection and splenic infarct.  After different  antibiotics, currently remains on cefazolin.  Followed by ID.   With clinical recovery, ID recommended 4 weeks of Zyvox on discharge.  Splenic infarct Noted to have splenic infarct on CT scan of the abdomen.  Initially anticoagulated but he stopped due to GI bleeding. 2D echocardiogram did not show any vegetation.  Not a candidate for anticoagulation at this time. Repeat CT scan showed stable infarct.  AKI Normalized.    Alcoholic cirrhosis of liver with melena and bright red blood.   underwent EGD 02/19/2022 with findings of single column of esophageal varices, small esophageal ulcer which was clean-based, mild portal hypertensive gastropathy with contact oozing.  Stabilizing.  Hemoglobin has remained stable since initial drop. Changed to oral Protonix. Lactulose 20 g 3 times daily, no bowel movement.  Increase dose of lactulose today to 30 g 3 times daily.   Mental status has improved and normalized. Will discharge home on metoprolol, rifaximin and lactulose.  Acute blood loss anemia hemoglobin is 6.9.  No evidence of active bleeding, however this has been slow drop possibly secondary to blood draws.  1 unit of PRBC today.  Alcohol use with mild withdrawal. Was a  heavy drinker.  Stopped drinking after February 09, 2022.  No withdrawal symptoms.  No more needing benzodiazepine.  Remains on multivitamins.  Motivated to quit.  Hypertension Continue to hold losartan and HCTZ.  Continue metoprolol.  Stable.   DVT prophylaxis: Place and maintain sequential compression device Start: 02/19/22 1325      Code Status:     Code Status: Full Code with full scope of treatment.  Disposition: Home with home health.  Anticipate discharge home tomorrow morning family is ready  to help him and devices are available at home.  Status is: Inpatient  The patient is inpatient because: on IV antibiotics, no bowel movement.  Blood transfusion.   Family Communication:  Wife at the  bedside.  Consultants:  GI Infectious disease. Oncology Dr. Rica Records Palliative care  Procedures:  Upper GI endoscopy with biopsy  Antimicrobials:  Cefazolin, rifaximin  Subjective: Seen and examined.  Denies any complaints.  Not having any bowel movement yet.  Started on transfusion.  Objective: Vitals:   02/27/22 0456 02/27/22 1007 02/27/22 1012 02/27/22 1024  BP: (!) 115/49 (!) 129/49 (!) 129/49 (!) 132/55  Pulse: 72 71 71 73  Resp: 16 16 16 16   Temp: 98.8 F (37.1 C) 98.7 F (37.1 C) 98.7 F (37.1 C) 97.9 F (36.6 C)  TempSrc: Oral Oral Oral Oral  SpO2: 96% 100% 100% 100%  Weight: 103.6 kg     Height:        Intake/Output Summary (Last 24 hours) at 02/27/2022 1145 Last data filed at 02/27/2022 0456 Gross per 24 hour  Intake 100 ml  Output 1800 ml  Net -1700 ml    Filed Weights   02/26/22 0333 02/26/22 0500 02/27/22 0456  Weight: 102.4 kg 101.1 kg 103.6 kg    Physical Examination: Body mass index is 35.77 kg/m.   General: Comfortable sitting in chair.   Cardiovascular: S1-S2 normal.  Regular rate rhythm. Respiratory: Bilateral clear with some upper airway sounds.  No other added sounds. Gastrointestinal: Soft.  Nontender.  Distended.  Bowel sound present.   Ext: No edema.  No cyanosis.  No clubbing. Neuro: Alert awake oriented.  Moves all extremities.  No asterixis.  No tremors. Musculoskeletal: No deformities. Skin: Intact.  Data Reviewed:   CBC: Recent Labs  Lab 02/23/22 0446 02/24/22 0455 02/25/22 0530 02/26/22 0536 02/27/22 0535  WBC 16.0* 14.9* 14.2* 12.9* 12.9*  NEUTROABS 12.7* 11.9* 11.1* 9.8* 10.0*  HGB 7.2* 7.1* 7.5* 7.2* 6.9*  HCT 22.5* 22.2* 23.0* 22.2* 21.7*  MCV 105.1* 105.7* 106.5* 106.7* 107.4*  PLT 103* 102* 117* 130* 133*     Basic Metabolic Panel: Recent Labs  Lab 02/21/22 0505 02/22/22 0505 02/23/22 0446 02/24/22 0455 02/25/22 0530 02/26/22 0536  NA 145 143 139 137 134* 134*  K 3.9 3.5 4.0 4.2 4.5  4.4  CL 117* 115* 113* 112* 110 108  CO2 21* 20* 21* 20* 19* 20*  GLUCOSE 121* 117* 109* 118* 97 94  BUN 61* 46* 37* 34* 31* 32*  CREATININE 1.26* 1.18 1.06 1.07 0.98 1.01  CALCIUM 8.9 8.9 8.6* 8.8* 8.5* 8.7*  MG 2.5* 2.0  --   --   --  1.8  PHOS  --  2.1* 2.6  --   --  3.2     Liver Function Tests: Recent Labs  Lab 02/22/22 0505 02/23/22 0446 02/24/22 0455 02/25/22 0530 02/26/22 0536  AST 35 34 36 39 46*  ALT 11 11 11 11 12   ALKPHOS 81 87 88 90 92  BILITOT 5.7* 5.2* 4.5* 3.8* 4.6*  PROT 6.2* 6.2* 6.3* 6.5 6.6  ALBUMIN 3.0* 2.7* 2.6* 2.4* 2.6*      Radiology Studies: No results found.    LOS: 10 days    Barb Merino, MD [facility] Available via Epic secure chat 7am-7pm After these hours, please refer to coverage provider listed on [email or web address] 02/27/2022, 11:45 AM

## 2022-02-27 NOTE — Progress Notes (Signed)
Physical Therapy Treatment Patient Details Name: Marc Ponce MRN: [id number] DOB: 01/24/1956 Today's Date: 02/27/2022   History of Present Illness Patient is a 66 year old male admitted 02/16/22 or management of low back pain and incontinence.  During hospitalization patient has had imaging which has shown cirrhosis with portal hypertension and splenomegaly with small ascites and also a moderate splenic infarct.  Patient was seen by GI and underwent endoscopic evaluation on 02/19/2022 and was found to have oozing from portal gastropathy. Patient also found to have bacteremia and so had MRI imaging which showed L4-L5 right facet effusion with some periarticular fluid collection thought to be septic facet arthritis and potential abscess in the right quadratus lumborum. Palliative care involved. PMH includes: steatohepatitis, chronic alcohol use, HTN, hyperlipidemia, GERD, and diverticulosis    PT Comments    Pt reports receiving blood earlier and feeling fatigued however agreeable to mobilize.  Pt requires increased time however only min/guard assist.  Pt anticipates d/c home soon.    Recommendations for follow up therapy are one component of a multi-disciplinary discharge planning process, led by the attending physician.  Recommendations may be updated based on patient status, additional functional criteria and insurance authorization.  Follow Up Recommendations  Home health PT     Assistance Recommended at Discharge Frequent or constant Supervision/Assistance  Patient can return home with the following Assistance with cooking/housework;Direct supervision/assist for medications management;Assist for transportation;Help with stairs or ramp for entrance;A little help with walking and/or transfers;A little help with bathing/dressing/bathroom   Equipment Recommendations  Rolling walker (2 wheels);BSC/3in1    Recommendations for Other Services       Precautions / Restrictions  Precautions Precautions: Fall     Mobility  Bed Mobility Overal bed mobility: Needs Assistance Bed Mobility: Supine to Sit     Supine to sit: Min assist, HOB elevated     General bed mobility comments: cues for technique, required elevated HOB (getting hospital bed at home); assist for trunk upright    Transfers Overall transfer level: Needs assistance Equipment used: Rolling walker (2 wheels) Transfers: Sit to/from Stand Sit to Stand: Min guard           General transfer comment: cues for hand placement and safety    Ambulation/Gait Ambulation/Gait assistance: Min guard Gait Distance (Feet): 300 Feet Assistive device: Rolling walker (2 wheels) Gait Pattern/deviations: Step-through pattern, Decreased stride length, Antalgic Gait velocity: decr     General Gait Details: mildly antalgic gait as pt reports right knee "acting up"; no assist required; very slow pace   Chief Strategy Officer    Modified Rankin (Stroke Patients Only)       Balance                                            Cognition Arousal/Alertness: Awake/alert Behavior During Therapy: WFL for tasks assessed/performed Overall Cognitive Status: Within Functional Limits for tasks assessed                                          Exercises      General Comments        Pertinent Vitals/Pain Pain Assessment Pain Assessment: Faces Faces Pain Scale: Hurts little more Pain Location:  right knee Pain Descriptors / Indicators: Sore Pain Intervention(s): Repositioned, Monitored during session    Home Living                          Prior Function            PT Goals (current goals can now be found in the care plan section) Progress towards PT goals: Progressing toward goals    Frequency    Min 3X/week      PT Plan Current plan remains appropriate    Co-evaluation              AM-PAC PT "6 Clicks"  Mobility   Outcome Measure  Help needed turning from your back to your side while in a flat bed without using bedrails?: A Little Help needed moving from lying on your back to sitting on the side of a flat bed without using bedrails?: A Little Help needed moving to and from a bed to a chair (including a wheelchair)?: A Little Help needed standing up from a chair using your arms (e.g., wheelchair or bedside chair)?: A Little Help needed to walk in hospital room?: A Little Help needed climbing 3-5 steps with a railing? : A Lot 6 Click Score: 17    End of Session Equipment Utilized During Treatment: Gait belt Activity Tolerance: Patient tolerated treatment well Patient left: with call bell/phone within reach (on [facility] per request, RN aware) Nurse Communication: Mobility status PT Visit Diagnosis: Muscle weakness (generalized) (M62.81);Difficulty in walking, not elsewhere classified (R26.2)     Time: 1434-1500 PT Time Calculation (min) (ACUTE ONLY): 26 min  Charges:  $Gait Training: 23-37 mins                    Jannette Spanner PT, DPT Physical Therapist Acute Rehabilitation Services Preferred contact method: Secure Chat Weekend Pager Only: [phone] Office: [place] 02/27/2022, 3:58 PM

## 2022-02-28 ENCOUNTER — Telehealth: Payer: Self-pay

## 2022-02-28 ENCOUNTER — Telehealth: Payer: Self-pay | Admitting: Family Medicine

## 2022-02-28 LAB — CBC WITH DIFFERENTIAL/PLATELET
Abs Immature Granulocytes: 0.1 10*3/uL — ABNORMAL HIGH (ref 0.00–0.07)
Basophils Absolute: 0.1 10*3/uL (ref 0.0–0.1)
Basophils Relative: 1 %
Eosinophils Absolute: 0.3 10*3/uL (ref 0.0–0.5)
Eosinophils Relative: 2 %
HCT: 27 % — ABNORMAL LOW (ref 39.0–52.0)
Hemoglobin: 8.8 g/dL — ABNORMAL LOW (ref 13.0–17.0)
Immature Granulocytes: 1 %
Lymphocytes Relative: 8 %
Lymphs Abs: 1.1 10*3/uL (ref 0.7–4.0)
MCH: 34.8 pg — ABNORMAL HIGH (ref 26.0–34.0)
MCHC: 32.6 g/dL (ref 30.0–36.0)
MCV: 106.7 fL — ABNORMAL HIGH (ref 80.0–100.0)
Monocytes Absolute: 1.3 10*3/uL — ABNORMAL HIGH (ref 0.1–1.0)
Monocytes Relative: 9 %
Neutro Abs: 10.6 10*3/uL — ABNORMAL HIGH (ref 1.7–7.7)
Neutrophils Relative %: 79 %
Platelets: 152 10*3/uL (ref 150–400)
RBC: 2.53 MIL/uL — ABNORMAL LOW (ref 4.22–5.81)
RDW: 21.9 % — ABNORMAL HIGH (ref 11.5–15.5)
WBC: 13.4 10*3/uL — ABNORMAL HIGH (ref 4.0–10.5)
nRBC: 0 % (ref 0.0–0.2)

## 2022-02-28 LAB — BPAM RBC
Blood Product Expiration Date: 202311262359
ISSUE DATE / TIME: 202310290956
Unit Type and Rh: 5100

## 2022-02-28 LAB — TYPE AND SCREEN
ABO/RH(D): O POS
Antibody Screen: NEGATIVE
Unit division: 0

## 2022-02-28 MED ORDER — LACTULOSE 10 GM/15ML PO SOLN: 30.0000 g | Freq: Two times a day (BID) | ORAL | 2 refills | Status: AC | PRN

## 2022-02-28 MED ORDER — SUCRALFATE 1 G PO TABS: 1.0000 g | ORAL_TABLET | Freq: Four times a day (QID) | ORAL | 11 refills | Status: AC

## 2022-02-28 MED ORDER — ADULT MULTIVITAMIN W/MINERALS CH: 1.0000 | ORAL_TABLET | Freq: Every day | ORAL | 0 refills | Status: AC

## 2022-02-28 MED ORDER — RIFAXIMIN 550 MG PO TABS: 550.0000 mg | ORAL_TABLET | Freq: Two times a day (BID) | ORAL | 0 refills | Status: AC

## 2022-02-28 NOTE — Telephone Encounter (Signed)
Transition Care Management Unsuccessful Follow-up Telephone Call  Date of discharge and from where:  [facility] 02/28/2022  Attempts:  1st Attempt  Reason for unsuccessful TCM follow-up call:  Left voice message Juanda Crumble, [place] Direct Dial [phone]

## 2022-02-28 NOTE — Discharge Summary (Signed)
Physician Discharge Summary  Marc Ponce HUD:[id number] DOB: April 03, 1956 DOA: 02/16/2022  PCP: [name], MD  Admit date: 02/16/2022 Discharge date: 02/28/2022  Admitted From: Home Disposition: Home with home health  Recommendations for Outpatient Follow-up:  Follow up with PCP in 1-2 weeks Please obtain CMP /CBC in one week GI and ID clinic to schedule follow-up  Home Health: PT/OT Equipment/Devices: Hospital bed, rolling walker and bedside commode  Discharge Condition: Stable CODE STATUS: Full code Diet recommendation: Low-salt, low-protein diet  Discharge summary: 66 year old with steatohepatitis, chronic alcohol use, hypertension, hyperlipidemia, GERD presented to ER with increasing low back pain and incontinence.  Had recently stopped drinking.  He does have chronic liver disease with bilirubin 4-5 range.  In the emergency room creatinine 2, total bili 8, WBC count 18.   CT scan showed splenic infarct, subsequent MRI showed L4/L5 right facet effusion and periarticular fluid consistent with abscess.  10/18 blood culture positive for staph lugdunensis 10/21, blood cultures negative. Remained very sick with hepatic encephalopathy and confusion, he did ultimately stabilized and improving now and going home today.   Sepsis present on admission secondary to bacteremia from staph lugdunensis L4-L5 facet septic arthritis with no cord compression, nonsurgical Quadratus lumborum abscess, not big enough to drain. Splenic abscess. Blood cultures 10/18 positive.   Blood cultures 10/21 negative.  Clinically improving.  Repeat CT scan abdomen pelvis shows stable size of splenic collection and splenic infarct.  After different antibiotics, currently remains on cefazolin.  Followed by ID.   With clinical recovery, ID recommended 4 weeks of Zyvox on discharge.   Splenic infarct Noted to have splenic infarct on CT scan of the abdomen.  Initially anticoagulated but he stopped due  to GI bleeding. 2D echocardiogram did not show any vegetation.  Not a candidate for anticoagulation at this time. Repeat CT scan showed stable infarct.   AKI Normalized.    Alcoholic cirrhosis of liver with melena and bright red blood.   underwent EGD 02/19/2022 with findings of single column of esophageal varices, small esophageal ulcer which was clean-based, mild portal hypertensive gastropathy with contact oozing.  Stabilizing.  Hemoglobin has remained stable since initial drop.  His hemoglobin was 6.9, respiratory rate of PRBC with appropriate response.  Currently no evidence of active bleeding. Continue oral PPI. Lactulose 30 gm  twice daily to ensure 2-3 today.  Mental status has improved and normalized. Will discharge home on metoprolol, rifaximin and lactulose. GI clinic to schedule follow-up.   Alcohol use with withdrawal. Was a  heavy drinker.  Stopped drinking after February 09, 2022.  No withdrawal symptoms.  No more needing benzodiazepine.  Remains on multivitamins.  Motivated to quit.   Hypertension Continue to hold losartan and HCTZ.  Continue metoprolol.  Stable.   Medically stabilized to discharge home.   Discharge Diagnoses:  Principal Problem:   Splenic infarction Active Problems:   Alcohol abuse, daily use   HTN (hypertension)   Hyperlipidemia   Elevated LFTs   GERD (gastroesophageal reflux disease)   Cirrhosis (HCC)   Leukocytosis   AKI (acute kidney injury) ([place])   Splenic infarct   Thrombocytopenia (HCC)   Melena   Acute blood loss anemia   Secondary esophageal varices without bleeding (HCC)   Portal hypertensive gastropathy (HCC)   Ulcer of esophagus without bleeding   Acute hepatic encephalopathy (HCC)   Bacteremia   Intra-abdominal abscess (HCC)   Weakness   Goals of care, counseling/discussion   Palliative care encounter   Counseling  and coordination of care   Vertebral osteomyelitis [facility])    Discharge Instructions  Discharge  Instructions     Amb Referral to Palliative Care   Complete by: As directed    Ambulatory referral to Gastroenterology   Complete by: As directed    What is the reason for referral?: Other Comment - hospital follow up   Ambulatory referral to Infectious Disease   Complete by: As directed    Hospital follow up   Call MD for:  persistant dizziness or light-headedness   Complete by: As directed    Call MD for:  severe uncontrolled pain   Complete by: As directed    Diet - low sodium heart healthy   Complete by: As directed    Increase activity slowly   Complete by: As directed       Allergies as of 02/28/2022   No Known Allergies      Medication List     STOP taking these medications    losartan-hydrochlorothiazide 100-12.5 MG tablet Commonly known as: HYZAAR   methocarbamol 500 MG tablet Commonly known as: ROBAXIN   predniSONE 20 MG tablet Commonly known as: DELTASONE       TAKE these medications    ipratropium 0.06 % nasal spray Commonly known as: ATROVENT Place 2 sprays into both nostrils 2 (two) times daily as needed for rhinitis.   lactulose 10 GM/15ML solution Commonly known as: CHRONULAC Take 45 mLs (30 g total) by mouth 2 (two) times daily as needed for mild constipation or moderate constipation (to make 2-3 loose BM a day).   lansoprazole 30 MG capsule Commonly known as: PREVACID Take 30 mg by mouth daily as needed (Reflux).   linezolid 600 MG tablet Commonly known as: Zyvox Take 1 tablet (600 mg total) by mouth 2 (two) times daily for 28 days.   metoprolol succinate 50 MG 24 hr tablet Commonly known as: TOPROL-XL TAKE 1 TABLET BY MOUTH DAILY. TAKE WITH OR IMMEDIATELY FOLLOWING A MEAL. What changed:  how much to take how to take this when to take this additional instructions   multivitamin with minerals Tabs tablet Take 1 tablet by mouth daily. Start taking on: March 01, 2022   PROBIOTIC DAILY PO Take 1 capsule by mouth daily.    rifaximin 550 MG Tabs tablet Commonly known as: XIFAXAN Take 1 tablet (550 mg total) by mouth 2 (two) times daily.   sucralfate 1 g tablet Commonly known as: Carafate Take 1 tablet (1 g total) by mouth 4 (four) times daily.               Durable Medical Equipment  (From admission, onward)           Start     Ordered   02/25/22 1010  For home use only DME Hospital bed  Once       Question Answer Comment  Length of Need Lifetime   Patient has (list medical condition): Bacteremia , Liver Failure   The above medical condition requires: Patient requires the ability to reposition frequently   Head must be elevated greater than: 30 degrees   Bed type Semi-electric   Support Surface: Gel Overlay      02/25/22 1010   02/24/22 1152  For home use only DME Walker rolling  Once       Question Answer Comment  Walker: With [place]   Patient needs a walker to treat with the following condition Bacteremia  02/24/22 1151   02/23/22 1324  For home use only DME 3 n 1  Once       Comments: Pt is limited to bedroom and will be alone for periods of 1-3 hours during the daytime and unable to ambulate safe distance to bathroom. Pt is able to complete stand pivot transfers to safely use BSC in bedroom.   02/23/22 1326            No Known Allergies  Consultations: Gastroenterology Infectious disease   Procedures/Studies: CT ABDOMEN PELVIS W CONTRAST  Result Date: 02/24/2022 CLINICAL DATA:  Intra-abdominal abscess, staph bacteremia, cirrhosis, splenic infarct concerning for abscess formation EXAM: CT ABDOMEN AND PELVIS WITH CONTRAST TECHNIQUE: Multidetector CT imaging of the abdomen and pelvis was performed using the standard protocol following bolus administration of intravenous contrast. RADIATION DOSE REDUCTION: This exam was performed according to the departmental dose-optimization program which includes automated exposure control, adjustment of the mA and/or kV  according to patient size and/or use of iterative reconstruction technique. CONTRAST:  136m OMNIPAQUE IOHEXOL 300 MG/ML SOLN additional oral enteric contrast COMPARISON:  CT chest abdomen pelvis, 02/16/2022 FINDINGS: Lower chest: Cardiomegaly and coronary artery calcifications. Aortic valve calcifications. Moderate left pleural effusion and associated atelectasis or consolidation, increased compared to prior examination. Trace right pleural effusion, unchanged. Hepatobiliary: No solid liver abnormality is seen. Coarse, nodular cirrhotic morphology of the liver. Simple cysts of the left lobe and caudate. Small gallstones. No gallbladder wall thickening. No biliary ductal dilatation. Recanalization of the umbilical vein with very large ventral abdominal varices (series 2, image 28). Moderate size splenic varices (series 2, image 31). Pancreas: Unremarkable. No pancreatic ductal dilatation or surrounding inflammatory changes. Spleen: Unchanged splenomegaly, maximum coronal span 16.2 cm. Unchanged large, bandlike subcapsular hypodensity of the anterior spleen (series 2, image 26). Unchanged subcapsular fluid collection of the anterolateral spleen measuring 5.9 x 1.8 cm (series 2, image 30). Adrenals/Urinary Tract: Adrenal glands are unremarkable. Kidneys are normal, without renal calculi, solid lesion, or hydronephrosis. Bladder is unremarkable. Stomach/Bowel: Stomach is within normal limits. Appendix is not clearly visualized. No evidence of bowel wall thickening, distention, or inflammatory changes. Sigmoid diverticula. Vascular/Lymphatic: Aortic atherosclerosis. No enlarged abdominal or pelvic lymph nodes. Reproductive: No mass or other significant abnormality. Other: Small, fat containing umbilical hernia. Anasarca. Moderate volume ascites, slightly increased compared to prior examination. Musculoskeletal: No acute or significant osseous findings. IMPRESSION: 1. Cirrhosis and stigmata of portal hypertension. 2.  Unchanged splenomegaly. Unchanged large, bandlike subcapsular hypodensity of the anterior spleen consistent with infarction. Unchanged subcapsular fluid collection of the anterolateral spleen measuring 5.9 x 1.8 cm. Presence or absence of infection within this fluid is not established by CT. 3. Moderate volume ascites, slightly increased compared to prior examination. 4. Moderate left pleural effusion and associated atelectasis or consolidation, increased compared to prior examination. Trace right pleural effusion, unchanged. 5. Cardiomegaly and coronary artery disease. Aortic Atherosclerosis (ICD10-I70.0). Electronically Signed   By: ADelanna AhmadiM.D.   On: 02/24/2022 15:31   ECHOCARDIOGRAM COMPLETE  Result Date: 02/19/2022    ECHOCARDIOGRAM REPORT   Patient Name:   Marc RASTETTERDate of Exam: 02/19/2022 Medical Rec #:  [id number]       Height:       67.0 in Accession #:    [id number]      Weight:       219.6 lb Date of Birth:  1Jul 27, 1957      BSA:  2.104 m Patient Age:    38 years         BP:           134/53 mmHg Patient Gender: M                HR:           71 bpm. Exam Location:  Inpatient Procedure: 2D Echo Indications:    Bacteremia  History:        Patient has prior history of Echocardiogram examinations, most                 recent 04/28/2017. Risk Factors:Hypertension.  Sonographer:    Harvie Junior Referring Phys: [id number] [place]  Sonographer Comments: Technically difficult study due to poor echo windows. Image acquisition challenging due to patient body habitus. IMPRESSIONS  1. Left ventricular ejection fraction, by estimation, is 70 to 75%. The left ventricle has hyperdynamic function. The left ventricle has no regional wall motion abnormalities. Left ventricular diastolic parameters were normal.  2. Right ventricular systolic function is normal. The right ventricular size is normal. There is normal pulmonary artery systolic pressure.  3. The mitral valve is normal in  structure. No evidence of mitral valve regurgitation. No evidence of mitral stenosis.  4. The aortic valve is tricuspid. There is moderate calcification of the aortic valve. There is moderate thickening of the aortic valve. Aortic valve regurgitation is not visualized. No aortic stenosis is present.  5. The inferior vena cava is normal in size with greater than 50% respiratory variability, suggesting right atrial pressure of 3 mmHg. FINDINGS  Left Ventricle: Left ventricular ejection fraction, by estimation, is 70 to 75%. The left ventricle has hyperdynamic function. The left ventricle has no regional wall motion abnormalities. The left ventricular internal cavity size was normal in size. There is no left ventricular hypertrophy. Left ventricular diastolic parameters were normal. Right Ventricle: The right ventricular size is normal. Right vetricular wall thickness was not well visualized. Right ventricular systolic function is normal. There is normal pulmonary artery systolic pressure. The tricuspid regurgitant velocity is 2.38 m/s, and with an assumed right atrial pressure of 3 mmHg, the estimated right ventricular systolic pressure is 46.8 mmHg. Left Atrium: Left atrial size was normal in size. Right Atrium: Right atrial size was normal in size. Pericardium: There is no evidence of pericardial effusion. Mitral Valve: The mitral valve is normal in structure. Mild mitral annular calcification. No evidence of mitral valve regurgitation. No evidence of mitral valve stenosis. Tricuspid Valve: The tricuspid valve is normal in structure. Tricuspid valve regurgitation is trivial. No evidence of tricuspid stenosis. Aortic Valve: The aortic valve is tricuspid. There is moderate calcification of the aortic valve. There is moderate thickening of the aortic valve. There is moderate aortic valve annular calcification. Aortic valve regurgitation is not visualized. No aortic stenosis is present. Aortic valve mean gradient  measures 7.0 mmHg. Aortic valve peak gradient measures 12.1 mmHg. Aortic valve area, by VTI measures 2.26 cm. Pulmonic Valve: The pulmonic valve was not well visualized. Pulmonic valve regurgitation is not visualized. No evidence of pulmonic stenosis. Aorta: The aortic root is normal in size and structure. Venous: The inferior vena cava is normal in size with greater than 50% respiratory variability, suggesting right atrial pressure of 3 mmHg. IAS/Shunts: No atrial level shunt detected by color flow Doppler.  LEFT VENTRICLE PLAX 2D LVIDd:         4.40 cm      Diastology LVIDs:  2.30 cm      LV e' medial:    8.81 cm/s LV PW:         0.90 cm      LV E/e' medial:  13.1 LV IVS:        0.90 cm      LV e' lateral:   11.40 cm/s LVOT diam:     2.10 cm      LV E/e' lateral: 10.1 LV SV:         80 LV SV Index:   38 LVOT Area:     3.46 cm  LV Volumes (MOD) LV vol d, MOD A2C: 119.0 ml LV vol d, MOD A4C: 147.0 ml LV vol s, MOD A2C: 43.1 ml LV vol s, MOD A4C: 55.7 ml LV SV MOD A2C:     75.9 ml LV SV MOD A4C:     147.0 ml LV SV MOD BP:      82.3 ml RIGHT VENTRICLE RV Basal diam:  3.60 cm RV Mid diam:    3.20 cm RV S prime:     12.40 cm/s TAPSE (M-mode): 2.2 cm LEFT ATRIUM             Index        RIGHT ATRIUM           Index LA diam:        3.60 cm 1.71 cm/m   RA Area:     14.00 cm LA Vol (A2C):   58.4 ml 27.75 ml/m  RA Volume:   35.40 ml  16.82 ml/m LA Vol (A4C):   58.0 ml 27.56 ml/m LA Biplane Vol: 63.2 ml 30.04 ml/m  AORTIC VALVE                     PULMONIC VALVE AV Area (Vmax):    2.07 cm      PV Vmax:       1.09 m/s AV Area (Vmean):   2.02 cm      PV Peak grad:  4.8 mmHg AV Area (VTI):     2.26 cm AV Vmax:           174.00 cm/s AV Vmean:          117.000 cm/s AV VTI:            0.352 m AV Peak Grad:      12.1 mmHg AV Mean Grad:      7.0 mmHg LVOT Vmax:         104.00 cm/s LVOT Vmean:        68.100 cm/s LVOT VTI:          0.230 m LVOT/AV VTI ratio: 0.65  AORTA Ao Root diam: 3.60 cm Ao Asc diam:  3.40 cm  MITRAL VALVE                TRICUSPID VALVE MV Area (PHT): 3.31 cm     TR Peak grad:   22.7 mmHg MV Decel Time: 229 msec     TR Vmax:        238.00 cm/s MV E velocity: 115.00 cm/s MV A velocity: 75.40 cm/s   SHUNTS MV E/A ratio:  1.53         Systemic VTI:  0.23 m                             Systemic Diam: 2.10 cm Carlyle Dolly MD Electronically  signed by Carlyle Dolly MD Signature Date/Time: 02/19/2022/3:09:15 PM    Final    MR Lumbar Spine W Wo Contrast  Result Date: 02/19/2022 CLINICAL DATA:  Thoracic and lumbar radiculopathy with infection suspected. Bacteremia EXAM: MRI THORACIC AND LUMBAR SPINE WITHOUT AND WITH CONTRAST TECHNIQUE: Multiplanar and multiecho pulse sequences of the thoracic and lumbar spine were obtained without and with intravenous contrast. CONTRAST:  42m GADAVIST GADOBUTROL 1 MMOL/ML IV SOLN COMPARISON:  None Available. FINDINGS: MRI THORACIC SPINE FINDINGS Alignment:  Exaggerated thoracic kyphosis.  No listhesis. Vertebrae: No fracture, evidence of discitis, or bone lesion. Cord:  Normal appearance when allowing for motion artifact. Paraspinal and other soft tissues: Trace pleural effusion and dependent atelectasis bilaterally. Disc levels: Midthoracic disc space narrowing and spondylitic spurring. No neural impingement MRI LUMBAR SPINE FINDINGS Segmentation:  Standard. Alignment:  Hyperlordosis with mild L5-S1 anterolisthesis Vertebrae: Right L4-5 facet joint effusion with periarticular edema and posteriorly dissecting fluid. No associated marrow edema, but still suspicious in this setting. Conus medullaris: Extends to the L1 level and appears normal. Paraspinal and other soft tissues: 1 cm fluid collection in the upper right quadratus lumborum where there is muscular expansion and edema Partially covered splenomegaly, known Disc levels: Facet spurring mainly at L5-S1 where there is mild anterolisthesis. At the same level the disc is desiccated and mildly narrow with annular  fissure. No degenerative impingement IMPRESSION: Thoracic MRI: No evidence of infection. Lumbar MRI: 1. L4-5 right facet effusion with posterior dissecting periarticular fluid collection, presumed septic facet arthritis in this clinical setting. The collection measures up to 13 mm. 2. 1 cm collection in the upper right quadratus lumborum, presumably intramuscular seeding and abscess. Electronically Signed   By: JJorje GuildM.D.   On: 02/19/2022 12:17   MR THORACIC SPINE W WO CONTRAST  Result Date: 02/19/2022 CLINICAL DATA:  Thoracic and lumbar radiculopathy with infection suspected. Bacteremia EXAM: MRI THORACIC AND LUMBAR SPINE WITHOUT AND WITH CONTRAST TECHNIQUE: Multiplanar and multiecho pulse sequences of the thoracic and lumbar spine were obtained without and with intravenous contrast. CONTRAST:  128mGADAVIST GADOBUTROL 1 MMOL/ML IV SOLN COMPARISON:  None Available. FINDINGS: MRI THORACIC SPINE FINDINGS Alignment:  Exaggerated thoracic kyphosis.  No listhesis. Vertebrae: No fracture, evidence of discitis, or bone lesion. Cord:  Normal appearance when allowing for motion artifact. Paraspinal and other soft tissues: Trace pleural effusion and dependent atelectasis bilaterally. Disc levels: Midthoracic disc space narrowing and spondylitic spurring. No neural impingement MRI LUMBAR SPINE FINDINGS Segmentation:  Standard. Alignment:  Hyperlordosis with mild L5-S1 anterolisthesis Vertebrae: Right L4-5 facet joint effusion with periarticular edema and posteriorly dissecting fluid. No associated marrow edema, but still suspicious in this setting. Conus medullaris: Extends to the L1 level and appears normal. Paraspinal and other soft tissues: 1 cm fluid collection in the upper right quadratus lumborum where there is muscular expansion and edema Partially covered splenomegaly, known Disc levels: Facet spurring mainly at L5-S1 where there is mild anterolisthesis. At the same level the disc is desiccated and  mildly narrow with annular fissure. No degenerative impingement IMPRESSION: Thoracic MRI: No evidence of infection. Lumbar MRI: 1. L4-5 right facet effusion with posterior dissecting periarticular fluid collection, presumed septic facet arthritis in this clinical setting. The collection measures up to 13 mm. 2. 1 cm collection in the upper right quadratus lumborum, presumably intramuscular seeding and abscess. Electronically Signed   By: JoJorje Guild.D.   On: 02/19/2022 12:17   CT CHEST ABDOMEN PELVIS WO CONTRAST  Result Date: 02/16/2022  CLINICAL DATA:  Right chest pain and severe low back pain. Generalized weakness. EXAM: CT CHEST, ABDOMEN AND PELVIS WITHOUT CONTRAST TECHNIQUE: Multidetector CT imaging of the chest, abdomen and pelvis was performed following the standard protocol without IV contrast. RADIATION DOSE REDUCTION: This exam was performed according to the departmental dose-optimization program which includes automated exposure control, adjustment of the mA and/or kV according to patient size and/or use of iterative reconstruction technique. COMPARISON:  Pelvic CT dated 05/25/2011 and CT abdomen pelvis dated 04/16/2011. FINDINGS: Evaluation of this exam is limited in the absence of intravenous contrast as well as due to respiratory motion. CT CHEST FINDINGS Cardiovascular: There is no cardiomegaly or pericardial effusion. There is coronary vascular calcification. Mild atherosclerotic calcification of the thoracic aorta. No aneurysmal dilatation. The central pulmonary arteries are grossly unremarkable. Mediastinum/Nodes: No hilar or mediastinal adenopathy. The esophagus is grossly unremarkable. Mediastinal fluid collection. Lungs/Pleura: Small bilateral pleural effusions with minimal partial compressive atelectasis of the lower lobes. Pneumonia is not excluded. Clinical correlation is recommended. No pneumothorax. The central airways are patent. Musculoskeletal: Mild degenerative changes of the  spine. Partially visualized left humeral head fixation screws. No acute osseous pathology. CT ABDOMEN PELVIS FINDINGS No intra-abdominal free air.  Small ascites. Hepatobiliary: Cirrhosis. Indeterminate hepatic hypodense lesions measuring up to 2 cm in the caudate lobe. These can be better characterized with MRI without and with contrast on a nonemergent/outpatient basis. No biliary ductal dilatation. Gallstones. No pericholecystic fluid or evidence of acute cholecystitis by CT. Pancreas: The pancreas is grossly unremarkable. Stranding of the peripancreatic fat, likely related to ascites. Correlation with pancreatic enzymes recommended if there is clinical concern for acute pancreatitis. Spleen: The spleen is enlarged measuring approximately 7 cm in length. There is a large area of hypodensity in the mid to upper pole of the spleen most consistent with infarct. Adrenals/Urinary Tract: The adrenal glands are unremarkable. The kidneys, visualized ureters, and urinary bladder appear unremarkable. Stomach/Bowel: There is sigmoid diverticulosis. There is no bowel obstruction. The appendix is normal. Vascular/Lymphatic: Mild aortoiliac atherosclerotic disease. The IVC is unremarkable. No portal venous gas. There is no adenopathy. Reproductive: The prostate and seminal vesicles are grossly unremarkable. No pelvic mass. Other: Diffuse mesenteric edema. Mild engorgement of the mesenteric vessels. There is enlargement of the umbilical vein. Musculoskeletal: Degenerative changes of the spine. No acute osseous pathology. IMPRESSION: 1. Small bilateral pleural effusions with minimal partial compressive atelectasis of the lower lobes. 2. Cirrhosis with portal hypertension, splenomegaly, and small ascites. 3. Moderate sized splenic infarct. 4. Cholelithiasis. 5. Sigmoid diverticulosis. No bowel obstruction. Normal appendix. 6.  Aortic Atherosclerosis (ICD10-I70.0). Electronically Signed   By: Anner Crete M.D.   On: 02/16/2022  19:06   CT Head Wo Contrast  Result Date: 02/16/2022 CLINICAL DATA:  Recent fall EXAM: CT HEAD WITHOUT CONTRAST TECHNIQUE: Contiguous axial images were obtained from the base of the skull through the vertex without intravenous contrast. RADIATION DOSE REDUCTION: This exam was performed according to the departmental dose-optimization program which includes automated exposure control, adjustment of the mA and/or kV according to patient size and/or use of iterative reconstruction technique. COMPARISON:  None Available. FINDINGS: Brain: No acute intracranial findings are seen. There are no signs of bleeding within the cranium. Cortical sulci are prominent. There is 4 mm calcification in the falx in the image 36 of series 4, possibly dystrophic dural calcification. Less likely possibility would be a tiny meningioma. There is no adjacent edema or mass effect. Vascular: Unremarkable. Skull: No fracture is seen.  Sinuses/Orbits: There is mucosal thickening in maxillary sinuses, more so on the left side. Other: None. IMPRESSION: No acute intracranial findings are seen in noncontrast CT brain. Atrophy. There is 4 mm calcification in falx adjacent to corpus callosum, possibly dystrophic dural calcification. Chronic maxillary sinusitis. Electronically Signed   By: Elmer Picker M.D.   On: 02/16/2022 18:33   DG Knee 2 Views Left  Result Date: 02/16/2022 CLINICAL DATA:  Fall, pain. EXAM: LEFT KNEE - 1-2 VIEW COMPARISON:  None Available. FINDINGS: Status post left knee total arthroplasty. The hardware is intact. No perihardware loosening. No acute fracture. IMPRESSION: Status post left knee total arthroplasty. No acute fracture. Electronically Signed   By: Keane Police D.O.   On: 02/16/2022 17:12   (Echo, Carotid, EGD, Colonoscopy, ERCP)    Subjective: Patient was seen and examined.  Wife at the bedside.  No overnight events.  Able to have 4 loose stools after increasing dose of lactulose.  Very excited to go  home.   Discharge Exam: Vitals:   02/27/22 2007 02/28/22 0422  BP: (!) 151/67 (!) 130/55  Pulse: 70 71  Resp: 20 18  Temp: 98.2 F (36.8 C) 97.9 F (36.6 C)  SpO2: 96% 98%   Vitals:   02/27/22 1024 02/27/22 1328 02/27/22 2007 02/28/22 0422  BP: (!) 132/55 (!) 134/51 (!) 151/67 (!) 130/55  Pulse: 73 75 70 71  Resp: 16 16 20 18   Temp: 97.9 F (36.6 C) 98.1 F (36.7 C) 98.2 F (36.8 C) 97.9 F (36.6 C)  TempSrc: Oral Oral Oral Oral  SpO2: 100% 100% 96% 98%  Weight:      Height:        General: Pt is alert, awake, not in acute distress Sitting in chair.  Neurologic continued.  Comfortable looking. Cardiovascular: RRR, S1/S2 +, no rubs, no gallops Respiratory: CTA bilaterally, no wheezing, no rhonchi Abdominal: Soft, NT, ND, bowel sounds +, obese and pendulous Extremities: no edema, no cyanosis    The results of significant diagnostics from this hospitalization (including imaging, microbiology, ancillary and laboratory) are listed below for reference.     Microbiology: Recent Results (from the past 240 hour(s))  Culture, blood (Routine X 2) w Reflex to ID Panel     Status: None   Collection Time: 02/19/22 12:26 AM   Specimen: BLOOD RIGHT HAND  Result Value Ref Range Status   Specimen Description   Final    BLOOD RIGHT HAND Performed at [facility] [address] [place], [place] [address]    Special Requests   Final    BOTTLES DRAWN AEROBIC ONLY BCLV Performed at [facility] [address] [place], [place] [address]    Culture   Final    NO GROWTH 5 DAYS Performed at [facility], [place] [address] [place], [place] [address]    Report Status 02/24/2022 FINAL  Final  Culture, blood (Routine X 2) w Reflex to ID Panel     Status: None   Collection Time: 02/19/22  6:06 AM   Specimen: BLOOD  Result Value Ref Range Status   Specimen Description   Final    BLOOD BLOOD RIGHT HAND Performed at [facility] [address], [place], [place] [address]    Special Requests   Final    BOTTLES DRAWN AEROBIC ONLY Blood Culture adequate volume Performed at [facility] [address] [place], [place] [address]    Culture  Final    NO GROWTH 5 DAYS Performed at [facility], [place] [address], [place], [place] [address]    Report Status 02/24/2022 FINAL  Final     Labs: BNP (last 3 results) No results for input(s): "BNP" in the last 8760 hours. Basic Metabolic Panel: Recent Labs  Lab 02/22/22 0505 02/23/22 0446 02/24/22 0455 02/25/22 0530 02/26/22 0536  NA 143 139 137 134* 134*  K 3.5 4.0 4.2 4.5 4.4  CL 115* 113* 112* 110 108  CO2 20* 21* 20* 19* 20*  GLUCOSE 117* 109* 118* 97 94  BUN 46* 37* 34* 31* 32*  CREATININE 1.18 1.06 1.07 0.98 1.01  CALCIUM 8.9 8.6* 8.8* 8.5* 8.7*  MG 2.0  --   --   --  1.8  PHOS 2.1* 2.6  --   --  3.2   Liver Function Tests: Recent Labs  Lab 02/22/22 0505 02/23/22 0446 02/24/22 0455 02/25/22 0530 02/26/22 0536  AST 35 34 36 39 46*  ALT 11 11 11 11 12   ALKPHOS 81 87 88 90 92  BILITOT 5.7* 5.2* 4.5* 3.8* 4.6*  PROT 6.2* 6.2* 6.3* 6.5 6.6  ALBUMIN 3.0* 2.7* 2.6* 2.4* 2.6*   No results for input(s): "LIPASE", "AMYLASE" in the last 168 hours. Recent Labs  Lab 02/22/22 0505 02/23/22 0446 02/24/22 0455  AMMONIA 43* 46* 38*   CBC: Recent Labs  Lab 02/24/22 0455 02/25/22 0530 02/26/22 0536 02/27/22 0535 02/27/22 1421 02/28/22 0513  WBC 14.9* 14.2* 12.9* 12.9*  --  13.4*  NEUTROABS 11.9* 11.1* 9.8* 10.0*  --  10.6*  HGB 7.1* 7.5* 7.2* 6.9* 8.1* 8.8*  HCT 22.2* 23.0* 22.2* 21.7* 24.8* 27.0*  MCV 105.7* 106.5* 106.7* 107.4*  --  106.7*  PLT 102* 117* 130* 133*  --  152   Cardiac Enzymes: No results for input(s): "CKTOTAL", "CKMB", "CKMBINDEX", "TROPONINI" in the last 168 hours. BNP: Invalid input(s): "POCBNP" CBG: No results for input(s): "GLUCAP" in the last 168 hours. D-Dimer No results for  input(s): "DDIMER" in the last 72 hours. Hgb A1c No results for input(s): "HGBA1C" in the last 72 hours. Lipid Profile No results for input(s): "CHOL", "HDL", "LDLCALC", "TRIG", "CHOLHDL", "LDLDIRECT" in the last 72 hours. Thyroid function studies No results for input(s): "TSH", "T4TOTAL", "T3FREE", "THYROIDAB" in the last 72 hours.  Invalid input(s): "FREET3" Anemia work up No results for input(s): "VITAMINB12", "FOLATE", "FERRITIN", "TIBC", "IRON", "RETICCTPCT" in the last 72 hours. Urinalysis    Component Value Date/Time   COLORURINE YELLOW 02/16/2022 1820   APPEARANCEUR CLEAR 02/16/2022 1820   LABSPEC 1.016 02/16/2022 1820   PHURINE 5.5 02/16/2022 1820   GLUCOSEU NEGATIVE 02/16/2022 1820   HGBUR TRACE (A) 02/16/2022 1820   BILIRUBINUR NEGATIVE 02/16/2022 1820   BILIRUBINUR negative 04/15/2017 1616   KETONESUR NEGATIVE 02/16/2022 1820   PROTEINUR NEGATIVE 02/16/2022 1820   UROBILINOGEN 0.2 04/15/2017 1616   UROBILINOGEN 4.0 (H) 04/16/2011 2307   NITRITE NEGATIVE 02/16/2022 1820   LEUKOCYTESUR NEGATIVE 02/16/2022 1820   Sepsis Labs Recent Labs  Lab 02/25/22 0530 02/26/22 0536 02/27/22 0535 02/28/22 0513  WBC 14.2* 12.9* 12.9* 13.4*   Microbiology Recent Results (from the past 240 hour(s))  Culture, blood (Routine X 2) w Reflex to ID Panel     Status: None   Collection Time: 02/19/22 12:26 AM   Specimen: BLOOD RIGHT HAND  Result Value Ref Range Status   Specimen Description   Final    BLOOD RIGHT HAND Performed at [facility]  Hospital, [place] [address] [place], [place] [address]    Special Requests   Final    BOTTLES DRAWN AEROBIC ONLY BCLV Performed at [facility] [address], [place], [place] [address]    Culture   Final    NO GROWTH 5 DAYS Performed at [facility], [place] [address], [place], [place] [address]    Report Status 02/24/2022 FINAL  Final  Culture, blood (Routine X 2) w Reflex to ID Panel     Status: None    Collection Time: 02/19/22  6:06 AM   Specimen: BLOOD  Result Value Ref Range Status   Specimen Description   Final    BLOOD BLOOD RIGHT HAND Performed at [facility] [address] [place], [place] [address]    Special Requests   Final    BOTTLES DRAWN AEROBIC ONLY Blood Culture adequate volume Performed at [facility] [address] [place] [address], [place], [place] [address]    Culture   Final    NO GROWTH 5 DAYS Performed at [facility], [place] [address] [place], [place] [address]    Report Status 02/24/2022 FINAL  Final     Time coordinating discharge: 40 minutes  SIGNED:   Barb Merino, MD  [facility] 02/28/2022, 9:25 AM

## 2022-02-28 NOTE — Telephone Encounter (Signed)
I have not seen him posthospitalization-if the discharge summary is not adequate for requested services-hopefully we can get more information after November 3 visit

## 2022-02-28 NOTE — Progress Notes (Signed)
Mobility Specialist - Progress Note   02/28/22 0900  Mobility  Activity Ambulated with assistance in hallway  Level of Assistance Standby assist, set-up cues, supervision of patient - no hands on  Assistive Device Front wheel walker  Distance Ambulated (ft) 550 ft  Activity Response Tolerated well  Mobility Referral Yes  $Mobility charge 1 Mobility   Pt received in bed and agreed to mobility, some stiffness in knee with slight pain. Pt returned to chair with all needs met and staff and family in room.   Roderick Pee Mobility Specialist

## 2022-02-28 NOTE — Telephone Encounter (Signed)
Stacy with [facility] requests to be called at ph# [phone] , Option 2  Re: Patient will need to have either an active cancer diagnosis or be getting near to needing Hospice care within the next 1-2 months.

## 2022-02-28 NOTE — Telephone Encounter (Signed)
FYI, is this ok?

## 2022-02-28 NOTE — TOC Transition Note (Addendum)
Transition of Care [facility]) - CM/SW Discharge Note   Patient Details  Name: Marc Ponce MRN: [id number] Date of Birth: February 23, 1956  [facility] CM/SW Contact:  Vassie Moselle, [place] Phone Number: 02/28/2022, 9:45 AM   Clinical Narrative:    Pt is to return home with home health PT/OT provided through [facility]. Pt has had BSC and RW delivered to room by [facility]. Hospital bed was delivered to pt's home by [facility] on 10/29. Pt is to have outpatient palliative care through [facility]. No further TOC needs identified at this time.   Final next level of care: [place] Barriers to Discharge: No Barriers Identified   Patient Goals and CMS Choice Patient states their goals for this hospitalization and ongoing recovery are:: To return home CMS Medicare.gov Compare Post Acute Care list provided to:: Patient Choice offered to / list presented to : Patient, Spouse  Discharge Placement                       Discharge Plan and Services In-house Referral: NA Discharge Planning Services: CM Consult Post Acute Care Choice: NA          DME Arranged: Bedside commode, Hospital bed, Walker rolling DME Agency: [facility] Date DME Agency Contacted: 02/28/22 Time DME Agency Contacted: [phone] Representative spoke with at DME Agency: [place]: PT, OT [place] Agency: [place] Date [place]: 02/24/22 Time [place]: 1630 Representative spoke with at [place]: [place] Determinants of Health ([place]) Interventions Housing Interventions: Patient Refused   Readmission Risk Interventions    02/28/2022    9:43 AM  Readmission Risk Prevention Plan  Transportation Screening Complete  PCP or Specialist Appt within 5-7 Days Complete  Home Care Screening Complete  Medication Review (RN CM) Complete

## 2022-03-01 ENCOUNTER — Telehealth: Payer: Self-pay

## 2022-03-01 DIAGNOSIS — K7460 Unspecified cirrhosis of liver: Secondary | ICD-10-CM | POA: Diagnosis not present

## 2022-03-01 DIAGNOSIS — I10 Essential (primary) hypertension: Secondary | ICD-10-CM | POA: Diagnosis not present

## 2022-03-01 DIAGNOSIS — D735 Infarction of spleen: Secondary | ICD-10-CM | POA: Diagnosis not present

## 2022-03-01 DIAGNOSIS — K7581 Nonalcoholic steatohepatitis (NASH): Secondary | ICD-10-CM | POA: Diagnosis not present

## 2022-03-01 DIAGNOSIS — N179 Acute kidney failure, unspecified: Secondary | ICD-10-CM | POA: Diagnosis not present

## 2022-03-01 NOTE — Telephone Encounter (Signed)
Transition Care Management Follow-up Telephone Call Date of discharge and from where: Marc Ponce 02/28/2022 How have you been since you were released from the hospital? Not good Any questions or concerns? Yes  Items Reviewed: Did the pt receive and understand the discharge instructions provided? Yes  Medications obtained and verified? Yes  Other? No  Any new allergies since your discharge? No  Dietary orders reviewed? Yes Do you have support at home? Yes , foot swollen and med xifaxan is too expensive  Home Care and Equipment/Supplies: Were home health services ordered? yes If so, what is the name of the agency? [facility]  Has the agency set up a time to come to the patient's home? yes Were any new equipment or medical supplies ordered?  Yes: yes What is the name of the medical supply agency? [facility] Were you able to get the supplies/equipment? yes Do you have any questions related to the use of the equipment or supplies? No  Functional Questionnaire: (I = Independent and D = Dependent) ADLs: D  Bathing/Dressing- D  Meal Prep- D  Eating- I  Maintaining continence- D  Transferring/Ambulation- D  Managing Meds- D  Follow up appointments reviewed:  PCP Hospital f/u appt confirmed? Yes  Scheduled to see Dr Yong Channel on 03/04/2022 @ 10:20. [place] [facility] f/u appt confirmed? Yes  Scheduled to see Dr Johnny Bridge ID on 03/10/2022 @ 3:30. Are transportation arrangements needed? No  If their condition worsens, is the pt aware to call PCP or go to the Emergency Dept.? Yes Was the patient provided with contact information for the PCP's office or ED? Yes Was to pt encouraged to call back with questions or concerns? Yes  Juanda Crumble, LPN [place] Direct Dial [phone]

## 2022-03-01 NOTE — Telephone Encounter (Signed)
Attempted to contact patient to schedule a Palliative Care consult appointment. No answer on home or mobile left a message to return call.

## 2022-03-01 NOTE — Telephone Encounter (Signed)
Noted thanks °

## 2022-03-03 ENCOUNTER — Telehealth: Payer: Self-pay | Admitting: Family Medicine

## 2022-03-03 DIAGNOSIS — N179 Acute kidney failure, unspecified: Secondary | ICD-10-CM | POA: Diagnosis not present

## 2022-03-03 DIAGNOSIS — D735 Infarction of spleen: Secondary | ICD-10-CM | POA: Diagnosis not present

## 2022-03-03 DIAGNOSIS — K7460 Unspecified cirrhosis of liver: Secondary | ICD-10-CM | POA: Diagnosis not present

## 2022-03-03 DIAGNOSIS — K7581 Nonalcoholic steatohepatitis (NASH): Secondary | ICD-10-CM | POA: Diagnosis not present

## 2022-03-03 DIAGNOSIS — I10 Essential (primary) hypertension: Secondary | ICD-10-CM | POA: Diagnosis not present

## 2022-03-03 NOTE — Telephone Encounter (Signed)
You can provide orders beforehand- he just has to keep the appointment and you can inform home health that

## 2022-03-03 NOTE — Telephone Encounter (Signed)
Caller States: -Pt has appointment on 03/04/22  Agency:   [facility] OT/ PT/ Skilled nursing/ Social Work/ Speech:   PT  Reason for Request:   Evaluation shows need to improve balance and strengthening  Frequency:   1x/week for 1 week 2x/week for 3 weeks 1x/week for 3 weeks

## 2022-03-03 NOTE — Telephone Encounter (Signed)
Ok to give these orders after pt visit tomorrow?

## 2022-03-04 ENCOUNTER — Telehealth: Payer: Self-pay | Admitting: Family Medicine

## 2022-03-04 ENCOUNTER — Ambulatory Visit (INDEPENDENT_AMBULATORY_CARE_PROVIDER_SITE_OTHER): Payer: Medicare Other | Admitting: Family Medicine

## 2022-03-04 ENCOUNTER — Encounter: Payer: Self-pay | Admitting: Family Medicine

## 2022-03-04 VITALS — BP 122/60 | HR 72 | Temp 98.8°F | Ht 67.0 in | Wt 237.8 lb

## 2022-03-04 DIAGNOSIS — K7030 Alcoholic cirrhosis of liver without ascites: Secondary | ICD-10-CM

## 2022-03-04 DIAGNOSIS — Z8669 Personal history of other diseases of the nervous system and sense organs: Secondary | ICD-10-CM | POA: Diagnosis not present

## 2022-03-04 DIAGNOSIS — I8511 Secondary esophageal varices with bleeding: Secondary | ICD-10-CM | POA: Diagnosis not present

## 2022-03-04 DIAGNOSIS — M0000 Staphylococcal arthritis, unspecified joint: Secondary | ICD-10-CM

## 2022-03-04 DIAGNOSIS — L600 Ingrowing nail: Secondary | ICD-10-CM | POA: Diagnosis not present

## 2022-03-04 DIAGNOSIS — I10 Essential (primary) hypertension: Secondary | ICD-10-CM | POA: Diagnosis not present

## 2022-03-04 DIAGNOSIS — F1011 Alcohol abuse, in remission: Secondary | ICD-10-CM

## 2022-03-04 DIAGNOSIS — Z8739 Personal history of other diseases of the musculoskeletal system and connective tissue: Secondary | ICD-10-CM | POA: Diagnosis not present

## 2022-03-04 LAB — CBC WITH DIFFERENTIAL/PLATELET
Basophils Absolute: 0 10*3/uL (ref 0.0–0.1)
Basophils Relative: 0.5 % (ref 0.0–3.0)
Eosinophils Absolute: 0.1 10*3/uL (ref 0.0–0.7)
Eosinophils Relative: 1.7 % (ref 0.0–5.0)
HCT: 24.6 % — ABNORMAL LOW (ref 39.0–52.0)
Hemoglobin: 8.2 g/dL — ABNORMAL LOW (ref 13.0–17.0)
Lymphocytes Relative: 8.6 % — ABNORMAL LOW (ref 12.0–46.0)
Lymphs Abs: 0.8 10*3/uL (ref 0.7–4.0)
MCHC: 33.2 g/dL (ref 30.0–36.0)
MCV: 106.2 fl — ABNORMAL HIGH (ref 78.0–100.0)
Monocytes Absolute: 0.9 10*3/uL (ref 0.1–1.0)
Monocytes Relative: 10.1 % (ref 3.0–12.0)
Neutro Abs: 7 10*3/uL (ref 1.4–7.7)
Neutrophils Relative %: 79.1 % — ABNORMAL HIGH (ref 43.0–77.0)
Platelets: 121 10*3/uL — ABNORMAL LOW (ref 150.0–400.0)
RBC: 2.31 Mil/uL — ABNORMAL LOW (ref 4.22–5.81)
RDW: 22.6 % — ABNORMAL HIGH (ref 11.5–15.5)
WBC: 8.8 10*3/uL (ref 4.0–10.5)

## 2022-03-04 LAB — COMPREHENSIVE METABOLIC PANEL
ALT: 12 U/L (ref 0–53)
AST: 39 U/L — ABNORMAL HIGH (ref 0–37)
Albumin: 2.6 g/dL — ABNORMAL LOW (ref 3.5–5.2)
Alkaline Phosphatase: 96 U/L (ref 39–117)
BUN: 26 mg/dL — ABNORMAL HIGH (ref 6–23)
CO2: 20 mEq/L (ref 19–32)
Calcium: 8.8 mg/dL (ref 8.4–10.5)
Chloride: 104 mEq/L (ref 96–112)
Creatinine, Ser: 1.19 mg/dL (ref 0.40–1.50)
GFR: 64.3 mL/min (ref >60.00)
Glucose, Bld: 130 mg/dL — ABNORMAL HIGH (ref 70–99)
Potassium: 5.3 mEq/L — ABNORMAL HIGH (ref 3.5–5.1)
Sodium: 131 mEq/L — ABNORMAL LOW (ref 135–145)
Total Bilirubin: 4.8 mg/dL — ABNORMAL HIGH (ref 0.2–1.2)
Total Protein: 6.7 g/dL (ref 6.0–8.3)

## 2022-03-04 NOTE — Progress Notes (Signed)
Phone [phone]   Subjective:  Marc Ponce is a 66 y.o. year old very pleasant male patient who presents for transitional care management and hospital follow up for periarticular septic arthritis/abscess leading to bacteremia and sepsis, hepatic encephalopathy. Patient was hospitalized from 02/16/2022 to 02/28/2022. A TCM phone call was completed on 03/01/2022. Medical complexity moderate  Patient presented to the hospital with worsening low back pain and incontinence.  He had recently stopped drinking alcohol. Known chronic liver disease with bilirubin in the 4-5 range.  Creatinine in the emergency room up to 2 and bili had increased to 8.  White count was elevated at 18,000.  CT scan showed splenic infarct but thankfully repeat CT scan showed stability of splenic infarct (initially on anticoagulation but had to be stopped due to GI bleeding and he was maintained off of anticoagulation)-thankfully echocardiogram did not show any vegetation.  MRI of lumbar spine showed L4-L5 right facet effusion and periarticular fluid consistent with abscess.  He met criteria for sepsis -She also had significant confusion attributed to hepatic encephalopathy but later stabilized - Blood cultures positive for staph lugdunensis on 02/16/2022 but thankfully by 02/19/2022 blood cultures were negative.  He required infectious disease input-he was on cefazolin in the hospital and was recommended to continue 4 weeks of Zyvox on discharge - Acute kidney injury thankfully normalized by time of discharge  Patient with underlying alcoholic cirrhosis - Underwent EGD 02/19/2022 with findings of single column of esophageal varices, small esophageal ulcer which was clean-based.  Also had mild portal hypertensive gastropathy with contact oozing.  Due to GI bleeding anticoagulation was stopped as above. -Hemoglobin as low as 6.9 but improved to 8.8 before discharge though did receive a transfusion - Was treated with lactulose  and mental status improved - Discharged home on metoprolol, rifaximin, lactulose and scheduled for GI follow-up -on lactulose to help with hepatic encephalopathy  Patient with under lying alcohol abuse - Was dealing with withdrawal and the hospital after stopping drinking February 09, 2022-he reports today alcohol free since before hospitalization and he remains motivated to continue abstinence.  Did require benzodiazepines initially but no longer requiring.  Patient with underlying hypertension - Losartan and hydrochlorothiazide were held particular with kidney injury-metoprolol was started and continued  See problem oriented charting as well  Past Medical History-  Patient Active Problem List   Diagnosis Date Noted   Alcohol abuse, daily use 04/19/2011    Priority: High   GERD (gastroesophageal reflux disease) 12/26/2017    Priority: Medium    Elevated LFTs 04/21/2017    Priority: Medium    HTN (hypertension) 03/12/2013    Priority: Medium    Hyperlipidemia 03/12/2013    Priority: Medium    Steatohepatitis 04/19/2011    Priority: Medium    History of cellulitis 12/26/2017    Priority: Low   History of arthroplasty of left knee 05/26/2017    Priority: Low   Pain in left knee 05/10/2017    Priority: Low   Vertebral osteomyelitis ([place]) 02/24/2022   Counseling and coordination of care 02/23/2022   Bacteremia 02/22/2022   Intra-abdominal abscess ([place]) 02/22/2022   Weakness 02/22/2022   Goals of care, counseling/discussion 02/22/2022   Palliative care encounter 02/22/2022   Acute hepatic encephalopathy ([place]) 02/19/2022   Secondary esophageal varices without bleeding (HCC)    Portal hypertensive gastropathy (HCC)    Ulcer of esophagus without bleeding    Melena    Acute blood loss anemia    AKI (acute kidney  injury) ([place]) 02/17/2022   Splenic infarct 02/17/2022   Thrombocytopenia ([place])    Cirrhosis ([place]) 02/16/2022   Splenic infarction 02/16/2022   Leukocytosis 02/16/2022     Medications- reviewed and updated  A medical reconciliation was performed comparing current medicines to hospital discharge medications. Current Outpatient Medications  Medication Sig Dispense Refill   lactulose (CHRONULAC) 10 GM/15ML solution Take 45 mLs (30 g total) by mouth 2 (two) times daily as needed for mild constipation or moderate constipation (to make 2-3 loose BM a day). 946 mL 2   lansoprazole (PREVACID) 30 MG capsule Take 30 mg by mouth daily as needed (Reflux).     linezolid (ZYVOX) 600 MG tablet Take 1 tablet (600 mg total) by mouth 2 (two) times daily for 28 days. 56 tablet 0   metoprolol succinate (TOPROL-XL) 50 MG 24 hr tablet TAKE 1 TABLET BY MOUTH DAILY. TAKE WITH OR IMMEDIATELY FOLLOWING A MEAL. (Patient taking differently: Take 50 mg by mouth daily. TAKE WITH OR IMMEDIATELY FOLLOWING A MEAL.) 90 tablet 3   Multiple Vitamin (MULTIVITAMIN WITH MINERALS) TABS tablet Take 1 tablet by mouth daily. 30 tablet 0   Probiotic Product (PROBIOTIC DAILY PO) Take 1 capsule by mouth daily.     rifaximin (XIFAXAN) 550 MG TABS tablet Take 1 tablet (550 mg total) by mouth 2 (two) times daily. 60 tablet 0   sucralfate (CARAFATE) 1 g tablet Take 1 tablet (1 g total) by mouth 4 (four) times daily. 120 tablet 11   No current facility-administered medications for this visit.   Objective  Objective:  BP 122/60   Pulse 72   Temp 98.8 F (37.1 C)   Ht 5' 7" (1.702 m)   Wt 237 lb 12.8 oz (107.9 kg)   SpO2 95%   BMI 37.24 kg/m  Gen: NAD, resting comfortably CV: RRR no murmurs rubs or gallops Lungs: CTAB no crackles, wheeze, rhonchi Abdomen: soft/nontender/nondistended/normal bowel sounds. No rebound or guarding.  Ext: 1+ edema Skin: warm, dry, jaundiced with scleral icterus Neuro: grossly normal but walks with walker, moves all extremities, asterixis not noted Msk: ingrown left great toenail- some dried blood form reported trauma    Assessment and Plan:   1. Staphylococcal  arthritis, septic arthritis of facet joint at L4-L5 right side up to 13 mm (HCC)-also noted at upper right quadratus lumborum thought to be seeding -Patient with staphylococcal lugundensis bacteremia.  No vegetation on TTE.  Thankfully cleared on repeat culture.  Patient clinically improved.  Plan was for 4-week of linezolid at discharge and patient is taking this -Is scheduled with follow-up with Dr. Gale Journey on November 9-stressed importance of keeping this visit  2+ 3. Alcoholic cirrhosis, unspecified whether ascites present ([place]) with history of encephalopathy -Lactulose 45 mLs 2x daily- 2-3 BM confirmed and he will continue this dose to prevent at the encephalopathy - Currently on rifaximin but over $1000-we will reach out to GI to see if other options but I am not currently aware of them -Thrombocytopenia likely related to this -MELD 3.0 score of 29 calculated today with near 90% chance of 90-day survivability-hoping to see further improvement with repeat labs -Does have some edema which she reports worsened after transfusion-depending on course may need diuretics-if worsens should let us know ASAP -I agree with palliative care consult but do not think he is at the point of hospice - from GI note 02/20/22 "- Will eventually need MRI three-phase liver without and with contrast for [facility] screening and evaluation of indeterminate  2 cm hypodense lesion in the caudate lobe.  Can be done as outpatient " -At present patient is certainly disabled by his illness-family is going to investigate pursuing this though he will be eligible for Social Security next year they believe  4. Alcohol abuse, in remission -patient alcohol free since before hospitalization -He remains highly motivated to maintain remission and has firm support of wife as well -I think the severity of this illness has moved him toward more openness about his health as well as mental health  5. Essential hypertension Controlled on metoprolol  50 mg extended release alone-continue current medication  6. Secondary esophageal varices with bleeding (HCC)-suspect stable -Continue metoprolol -With GI bleed in the hospital continue lansoprazole as well as Carafate and update CBC today  7. Ingrown toenail-#ingrown toenail in left great toenail- then had some trauma to it with cart next to bed- will refer to podiatry Incidental finding-refer to podiatry   #home health needs- This visit will serve as face to face visit- just received message from [facility] for the following and team will provide verbal orders today - "Requesting OT/ PT/ Skilled nursing/ Social Work/ Speech:   PT   Reason for Request:   Evaluation shows need to improve balance and strengthening   Frequency:   1x/week for 1 week 2x/week for 3 weeks 1x/week for 3 weeks" #Left knee feels weaker- work with PT  Recommended follow up: Return in about 2 months (around 05/04/2022) for followup or sooner if needed.Schedule b4 you leave. Future Appointments  Date Time Provider [place]  03/10/2022  3:30 PM Jabier Mutton, MD [facility]  05/04/2022 10:20 AM [name], MD [facility]    Lab/Order associations:   ICD-10-CM   1. Staphylococcal arthritis, septic arthritis of unspecified location ([place])  M00.00     2. Alcoholic cirrhosis, unspecified whether ascites present ([place])  K70.30 Protime-INR    CANCELED: Protime-INR    3. History of encephalopathy  Z86.69     4. Alcohol abuse, in remission  F10.11     5. Essential hypertension  I10 CBC with Differential/Platelet    Comprehensive metabolic panel    6. Secondary esophageal varices with bleeding (HCC)  I85.11     7. Ingrown toenail  L60.0 Ambulatory referral to Podiatry     Time spent is documented in the event that this does not qualify for TCM-would be level [address] visit  Time Spent: 68 minutes of total time (10:41 AM-11:31 AM, 5:27 PM-5:45 PM) was spent on the date of the encounter  performing the following actions: chart review prior to seeing the patient, obtaining history, performing a medically necessary exam, coordinating care with home health, counseling on the treatment plan as well as morbidity/mortality risks, placing orders, and documenting in our EHR.    Return precautions advised.  Garret Reddish, MD

## 2022-03-04 NOTE — Telephone Encounter (Signed)
Does anything need to be added before I call? Not sure what information you were needing to get from the pt.

## 2022-03-04 NOTE — Telephone Encounter (Signed)
Called and lm for [name] tcb since unsure if this vm was confidential since her name was not stated.

## 2022-03-04 NOTE — Telephone Encounter (Signed)
Called and lm for pt tcb.

## 2022-03-04 NOTE — Telephone Encounter (Signed)
   Agency:   [facility] OT/ PT/ Skilled nursing/ Social Work/ Speech:   OT  Reason for Request:   OT evaluation results  Frequency:   1x/ week for 3 weeks To do ADL, IADL, therapeutic exercises and activities, fine motor skills, functional transferrs.

## 2022-03-04 NOTE — Telephone Encounter (Signed)
I would say he is palliative at this point-it is difficult to assess what his trajectory will be but I am cautiously optimistic for recovery

## 2022-03-04 NOTE — Telephone Encounter (Signed)
[name] returned call and VO given.

## 2022-03-04 NOTE — Patient Instructions (Addendum)
Please stop by lab before you go If you have mychart- we will send your results within 3 business days of [place] receiving them.  If you do not have mychart- we will call you about results within 5 business days of [place] receiving them.  *please also note that you will see labs on mychart as soon as they post. I will later go in and write notes on them- will say "notes from Dr. Yong Channel"   Team please out handicap form for parking- 6 months for now  [place] contact- please call to schedule with Dr. Ardis Hughs Please call to schedule visit and/or procedure Address: [address], [place], [place] [address] Phone: [phone]  Definitely keep infectious disease appointment  We will call you within two weeks about your referral to podiatry. If you do not hear within 2 weeks, give [place] a call.    Recommended follow up: Return in about 1 month (around 04/03/2022) for followup or sooner if needed.Schedule b4 you leave.

## 2022-03-04 NOTE — Telephone Encounter (Signed)
Called and spoke with Liji and VO has been given.

## 2022-03-04 NOTE — Telephone Encounter (Signed)
Marzetta Board*

## 2022-03-07 ENCOUNTER — Telehealth: Payer: Self-pay

## 2022-03-07 NOTE — Telephone Encounter (Signed)
LMOVM to schedule lab appt

## 2022-03-07 NOTE — Telephone Encounter (Signed)
Attempted to contact patient/patient's spouse Vaughan Basta to schedule a Palliative Care consult appointment. No answer left a message to return call.

## 2022-03-08 ENCOUNTER — Other Ambulatory Visit: Payer: Self-pay

## 2022-03-08 ENCOUNTER — Telehealth: Payer: Self-pay

## 2022-03-08 DIAGNOSIS — R17 Unspecified jaundice: Secondary | ICD-10-CM

## 2022-03-08 DIAGNOSIS — D649 Anemia, unspecified: Secondary | ICD-10-CM

## 2022-03-08 NOTE — Telephone Encounter (Signed)
F/u scheduled with Dr. Bryan Lemma on 04/05/22 at 9:40 am. Pt verbalized understanding. Pt needs xifaxan assistance. Thank you!

## 2022-03-08 NOTE — Telephone Encounter (Signed)
-----   Message from [place], DO sent at 03/07/2022  7:42 AM EST ----- Regarding: FW: hospital follow up Please see message from this patient's PCP.   Can you coordinate f/u with me or one of the APPs Can you please assist in getting Rifaximin assistance?   Thank you! ----- Message ----- From: [name], MD Sent: 03/04/2022   5:44 PM EST To: Lavena Bullion, DO Subject: hospital follow up                             Dr. Bryan Lemma,   I mistakenly asked patient to call to schedule follow up with Dr. Ardis Hughs forgetting he was out. I know you did EGD but was wondering if you could tell me who I should have him follow-up with outpatient for cirrhosis.  Also cost of rifaximin is over thousand dollars-family brought the first month but is not sure about ability to continue to do so-I told him I will reach out to see if GI was aware of any cost saving measures.  I am not aware of an alternate-he is doing well on the lactulose  Thanks for your time and consideration Annie Main Hunter-PCP

## 2022-03-09 ENCOUNTER — Telehealth: Payer: Self-pay

## 2022-03-09 ENCOUNTER — Other Ambulatory Visit (INDEPENDENT_AMBULATORY_CARE_PROVIDER_SITE_OTHER): Payer: Medicare Other

## 2022-03-09 DIAGNOSIS — N179 Acute kidney failure, unspecified: Secondary | ICD-10-CM | POA: Diagnosis not present

## 2022-03-09 DIAGNOSIS — K7030 Alcoholic cirrhosis of liver without ascites: Secondary | ICD-10-CM | POA: Diagnosis not present

## 2022-03-09 DIAGNOSIS — K7460 Unspecified cirrhosis of liver: Secondary | ICD-10-CM | POA: Diagnosis not present

## 2022-03-09 DIAGNOSIS — D735 Infarction of spleen: Secondary | ICD-10-CM | POA: Diagnosis not present

## 2022-03-09 DIAGNOSIS — K7581 Nonalcoholic steatohepatitis (NASH): Secondary | ICD-10-CM | POA: Diagnosis not present

## 2022-03-09 DIAGNOSIS — I10 Essential (primary) hypertension: Secondary | ICD-10-CM | POA: Diagnosis not present

## 2022-03-09 LAB — PROTIME-INR
INR: 2 ratio — ABNORMAL HIGH (ref 0.8–1.0)
Prothrombin Time: 21.4 s — ABNORMAL HIGH (ref 9.6–13.1)

## 2022-03-09 NOTE — Progress Notes (Signed)
Vml for pt to call back and sch 2 week lab apt only

## 2022-03-09 NOTE — Telephone Encounter (Signed)
Spoke with patient's spouse Vaughan Basta regarding palliative Care services. She declined services currently. She was requesting Home Health assistance but states they family has arranged the bathroom for easier bathing. Will cancel Palliative referral and notify referring provider.

## 2022-03-10 ENCOUNTER — Encounter: Payer: Self-pay | Admitting: Internal Medicine

## 2022-03-10 ENCOUNTER — Other Ambulatory Visit: Payer: Self-pay

## 2022-03-10 ENCOUNTER — Ambulatory Visit: Payer: Medicare Other | Admitting: Internal Medicine

## 2022-03-10 VITALS — BP 138/69 | HR 73 | Temp 98.1°F | Ht 67.0 in | Wt 231.0 lb

## 2022-03-10 DIAGNOSIS — D733 Abscess of spleen: Secondary | ICD-10-CM | POA: Diagnosis not present

## 2022-03-10 DIAGNOSIS — A411 Sepsis due to other specified staphylococcus: Secondary | ICD-10-CM | POA: Diagnosis not present

## 2022-03-10 DIAGNOSIS — M4620 Osteomyelitis of vertebra, site unspecified: Secondary | ICD-10-CM | POA: Diagnosis not present

## 2022-03-10 MED ORDER — CEFADROXIL 500 MG PO CAPS: 1000.0000 mg | ORAL_CAPSULE | Freq: Two times a day (BID) | ORAL | 0 refills | Status: AC

## 2022-03-10 NOTE — Patient Instructions (Addendum)
Overall infection seems undercontrol   Needs ct to assess spleen and see if antibiotics can be stopped  Orthopedic surgery will need to comment and follow the left knee; there is mild effusion and tenderness which is new and always uncomfortable in setting of blood stream infection.    Make sure you check crp, and cbc within 1 to 2 weeks and send mychart message to me   See me after ct scan is done

## 2022-03-10 NOTE — Progress Notes (Signed)
[facility] for Infectious Disease  Patient Active Problem List   Diagnosis Date Noted   Vertebral osteomyelitis ([place]) 02/24/2022   Counseling and coordination of care 02/23/2022   Bacteremia 02/22/2022   Intra-abdominal abscess ([place]) 02/22/2022   Weakness 02/22/2022   Goals of care, counseling/discussion 02/22/2022   Palliative care encounter 02/22/2022   Acute hepatic encephalopathy ([place]) 02/19/2022   Secondary esophageal varices without bleeding ([place])    Portal hypertensive gastropathy ([place])    Ulcer of esophagus without bleeding    Melena    Acute blood loss anemia    AKI (acute kidney injury) ([place]) 02/17/2022   Splenic infarct 02/17/2022   Thrombocytopenia ([place])    Cirrhosis ([place]) 02/16/2022   Splenic infarction 02/16/2022   Leukocytosis 02/16/2022   GERD (gastroesophageal reflux disease) 12/26/2017   History of cellulitis 12/26/2017   History of arthroplasty of left knee 05/26/2017   Pain in left knee 05/10/2017   Elevated LFTs 04/21/2017   HTN (hypertension) 03/12/2013   Hyperlipidemia 03/12/2013   Steatohepatitis 04/19/2011   Alcohol abuse, daily use 04/19/2011      Subjective:    Patient ID: Marc Ponce, male    DOB: Nov 07, 1955, 66 y.o.   MRN: [id number]  Chief Complaint  Patient presents with   Hospitalization Follow-up    HPI:  Marc Ponce is a 66 y.o. male etoh cirrhosis (decompensated), here for hospital f/u staph lugdenensis bacteremia with l4-5 vertebral om, right quadratus lumborum abscess, and splenic fluid collection  He received iv abx during admission Tte no veg; tee deferred as he cleared bacteremia and needed long duration of abx in setting vertebral om, soft tissue abscess Repeat ct abd pelv stable splenic fluid collection  He was discharged on 4 weeks of linezolid after 1 week iv cefazolin in hospital. With plan to transition to cefadroxil if needed further  ------- 03/10/22 id clinic f/u Taking linezolid Feels  a whole lot better but not to pre-hospital admission baseline before 01/2022 No diarrhea, despite taking lactulose. Has 2-3 stool a day little size.  No numbness/tingling No vision change Still weak/deconditioned Appetite is "good" No belly pain No back pain  He is also seeing gi doctor in follow up in about a week  Slight pain in left knee -- prosthesis. Pain just before 01/2022 admission. Slightly better Stable mild pain right shoulder -- no prior surgery  Left shoulder prosthesis no tenderness    No Known Allergies    Outpatient Medications Prior to Visit  Medication Sig Dispense Refill   lactulose (CHRONULAC) 10 GM/15ML solution Take 45 mLs (30 g total) by mouth 2 (two) times daily as needed for mild constipation or moderate constipation (to make 2-3 loose BM a day). 946 mL 2   lansoprazole (PREVACID) 30 MG capsule Take 30 mg by mouth daily as needed (Reflux).     linezolid (ZYVOX) 600 MG tablet Take 1 tablet (600 mg total) by mouth 2 (two) times daily for 28 days. 56 tablet 0   metoprolol succinate (TOPROL-XL) 50 MG 24 hr tablet TAKE 1 TABLET BY MOUTH DAILY. TAKE WITH OR IMMEDIATELY FOLLOWING A MEAL. (Patient taking differently: Take 50 mg by mouth daily. TAKE WITH OR IMMEDIATELY FOLLOWING A MEAL.) 90 tablet 3   Multiple Vitamin (MULTIVITAMIN WITH MINERALS) TABS tablet Take 1 tablet by mouth daily. 30 tablet 0   Probiotic Product (PROBIOTIC DAILY PO) Take 1 capsule by mouth daily.     rifaximin (XIFAXAN) 550 MG TABS  tablet Take 1 tablet (550 mg total) by mouth 2 (two) times daily. 60 tablet 0   sucralfate (CARAFATE) 1 g tablet Take 1 tablet (1 g total) by mouth 4 (four) times daily. 120 tablet 11   No facility-administered medications prior to visit.     Social History   Socioeconomic History   Marital status: Married    Spouse name: Not on file   Number of children: 3   Years of education: Not on file   Highest education level: Not on file  Occupational History    Not on file  Tobacco Use   Smoking status: Former    Types: Cigarettes    Quit date: 04/01/1986    Years since quitting: 35.9   Smokeless tobacco: Never  Vaping Use   Vaping Use: Never used  Substance and Sexual Activity   Alcohol use: Yes    Comment: 3-6 per day   Drug use: No   Sexual activity: Yes  Other Topics Concern   Not on file  Social History Narrative   Married. Adopted son of prior GF who died in fire, 2 step daughters.       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 Even in [place] driving to [place].       Hobbies: photography, site seeing, auto racing   Social Determinants of Health   Financial Resource Strain: Not on file  Food Insecurity: No Food Insecurity (02/16/2022)   Hunger Vital Sign    Worried About Running Out of Food in the Last Year: Never true    [place] in the Last Year: Never true  Transportation Needs: No Transportation Needs (02/16/2022)   PRAPARE - Hydrologist (Medical): No    Lack of Transportation (Non-Medical): No  Physical Activity: Not on file  Stress: Not on file  Social Connections: Not on file  Intimate Partner Violence: Not At Risk (02/16/2022)   Humiliation, Afraid, Rape, and Kick questionnaire    Fear of Current or Ex-Partner: No    Emotionally Abused: No    Physically Abused: No    Sexually Abused: No      Review of Systems    All other ros negative   Objective:    BP 138/69   Pulse 73   Temp 98.1 F (36.7 C) (Oral)   Ht 5' 7"  (1.702 m)   Wt 231 lb (104.8 kg)   BMI 36.18 kg/m  Nursing note and vital signs reviewed.  Physical Exam     General/constitutional: no distress, pleasant HEENT: Normocephalic, PER, Conj scleral icterus, EOMI, Oropharynx clear Neck supple CV: rrr no mrg Lungs: clear to auscultation, normal  respiratory effort Abd: Soft, Nontender Ext: no edema Skin: No Rash Neuro: nonfocal MSK: left total knee history --> slightly tender on palpation joint line and slight effusion along with bilateral soft tissue edema; right shoulder nontender; both sites (right shoulder/left knee) full rom; back spine nontender    Labs:  Micro:  Serology:  Imaging: I personally reviewed and incorporated into decision making 10/21 tte  1. Left ventricular ejection fraction, by estimation, is 70 to 75%. The  left ventricle has hyperdynamic function. The left ventricle has no  regional wall motion abnormalities. Left ventricular diastolic parameters  were normal.   2. Right ventricular systolic function  is normal. The right ventricular  size is normal. There is normal pulmonary artery systolic pressure.   3. The mitral valve is normal in structure. No evidence of mitral valve  regurgitation. No evidence of mitral stenosis.   4. The aortic valve is tricuspid. There is moderate calcification of the  aortic valve. There is moderate thickening of the aortic valve. Aortic  valve regurgitation is not visualized. No aortic stenosis is present.   5. The inferior vena cava is normal in size with greater than 50%  respiratory variability, suggesting right atrial pressure of 3 mmHg.    10/21 thoracic lumbar mri IMPRESSION: Thoracic MRI:   No evidence of infection.   Lumbar MRI:   1. L4-5 right facet effusion with posterior dissecting periarticular fluid collection, presumed septic facet arthritis in this clinical setting. The collection measures up to 13 mm. 2. 1 cm collection in the upper right quadratus lumborum, presumably intramuscular seeding and abscess.   10/26 abd pelv ct with contrast 1. Cirrhosis and stigmata of portal hypertension. 2. Unchanged splenomegaly. Unchanged large, bandlike subcapsular hypodensity of the anterior spleen consistent with infarction. Unchanged subcapsular fluid  collection of the anterolateral spleen measuring 5.9 x 1.8 cm. Presence or absence of infection within this fluid is not established by CT. 3. Moderate volume ascites, slightly increased compared to prior examination. 4. Moderate left pleural effusion and associated atelectasis or consolidation, increased compared to prior examination. Trace right pleural effusion, unchanged. 5. Cardiomegaly and coronary artery disease.  Assessment & Plan:   Problem List Items Addressed This Visit       Musculoskeletal and Integument   Vertebral osteomyelitis ([place])   Relevant Medications   cefadroxil (DURICEF) 500 MG capsule   Other Visit Diagnoses     Sepsis due to other specified Staphylococcus ([place])    -  Primary   Relevant Orders   CT ABDOMEN PELVIS W CONTRAST   Splenic abscess       Relevant Orders   CT ABDOMEN PELVIS W CONTRAST         No orders of the defined types were placed in this encounter.    Date of Admission:  02/16/2022                                      Abx: 10/19-c cefazolin   10/21-c rifaximin   10/18-19 cefepime/flagyl   Impression: Staph lugdenensis bacteremia Septic l4-5 arthritis and quadratus lumborum small abscess (1cm). Tte no obvious vegetation Splenic infarct suspect septic  EtoH cirrhosis with esophageal varices and PHG; decompensated  Left knee arthroplasty and left shoulder screw/prior surgery   10/18 bcx positive 10/21 bcx negative   Aki from sepsis improving Hepatic encephalopathy also better on abx and rifaximine/lactulose  Thrombocytopenia ?sepsis related and cirrhosis. too early for cefazolin   Given quadratus lumborum involvement, this is considered the equivalent/corollary of vertebral osteomyelitis. Mri also mentioned L4-5 septic arthritis. Treatment will need to be at least 6 weeks  Splenic fluid collection remains stable on repeat ct 10/26. This could however gets worse despite abx and will need f/u ct scan   As he had  cleared his bacteremia, I do not see need to push for tee at this time   He has no new pain in his shoulders (bilateral chronic and had some left shoulder steroid injection 1 week prior to this admission) or his left knee     ----------  03/10/22 id clinic assessment  significant improvement clinically Slight knee pain left prosthesis still needs monitoring and ortho input. Doesn't scream septic today; similarly with right shoulder Platelet slightly low will need repeat labs in 1-2 weeks; if continues to drop will switch linezolid to cefadroxil early. Otherwise transition to cefadroxil when linezolid is done  Abd ct in 2-3 weeks See me after ct scan or sooner if concern about joint involvement or fever/chill    Follow-up: Return in about 3 weeks (around 03/31/2022).  I have spent a total of 40 minutes of face-to-face and non-face-to-face time, excluding clinical staff time, preparing to see patient, ordering tests and/or medications, and provide counseling the patient     Jabier Mutton, [facility] 03/10/2022, 3:52 PM

## 2022-03-11 ENCOUNTER — Telehealth: Payer: Self-pay | Admitting: Family Medicine

## 2022-03-11 DIAGNOSIS — K7460 Unspecified cirrhosis of liver: Secondary | ICD-10-CM | POA: Diagnosis not present

## 2022-03-11 DIAGNOSIS — D735 Infarction of spleen: Secondary | ICD-10-CM | POA: Diagnosis not present

## 2022-03-11 DIAGNOSIS — I10 Essential (primary) hypertension: Secondary | ICD-10-CM | POA: Diagnosis not present

## 2022-03-11 DIAGNOSIS — N179 Acute kidney failure, unspecified: Secondary | ICD-10-CM | POA: Diagnosis not present

## 2022-03-11 DIAGNOSIS — K7581 Nonalcoholic steatohepatitis (NASH): Secondary | ICD-10-CM | POA: Diagnosis not present

## 2022-03-11 NOTE — Telephone Encounter (Signed)
noted 

## 2022-03-11 NOTE — Telephone Encounter (Signed)
..  Home Health Certification or Plan of Care Tracking  Is this a Certification or Plan of Care? yes  [facility] Agency: [facility]  Order Number:  [id number]  Has charge sheet been attached? yes  Where has form been placed:  In provider's box  Faxed to:   [phone]

## 2022-03-13 ENCOUNTER — Encounter: Payer: Self-pay | Admitting: Internal Medicine

## 2022-03-13 ENCOUNTER — Encounter: Payer: Self-pay | Admitting: Family Medicine

## 2022-03-14 DIAGNOSIS — K7581 Nonalcoholic steatohepatitis (NASH): Secondary | ICD-10-CM | POA: Diagnosis not present

## 2022-03-14 DIAGNOSIS — N179 Acute kidney failure, unspecified: Secondary | ICD-10-CM | POA: Diagnosis not present

## 2022-03-14 DIAGNOSIS — K7460 Unspecified cirrhosis of liver: Secondary | ICD-10-CM | POA: Diagnosis not present

## 2022-03-14 DIAGNOSIS — D735 Infarction of spleen: Secondary | ICD-10-CM | POA: Diagnosis not present

## 2022-03-14 DIAGNOSIS — I10 Essential (primary) hypertension: Secondary | ICD-10-CM | POA: Diagnosis not present

## 2022-03-15 ENCOUNTER — Other Ambulatory Visit: Payer: Self-pay

## 2022-03-15 ENCOUNTER — Ambulatory Visit: Payer: Medicare Other | Admitting: Podiatry

## 2022-03-15 DIAGNOSIS — L600 Ingrowing nail: Secondary | ICD-10-CM | POA: Diagnosis not present

## 2022-03-15 DIAGNOSIS — M25562 Pain in left knee: Secondary | ICD-10-CM

## 2022-03-15 MED ORDER — MUPIROCIN 2 % EX OINT: 1.0000 | TOPICAL_OINTMENT | Freq: Two times a day (BID) | CUTANEOUS | 2 refills | Status: AC

## 2022-03-15 NOTE — Progress Notes (Signed)
Subjective:   Patient ID: Marc Ponce, male   DOB: 66 y.o.   MRN: [id number]   HPI Chief Complaint  Patient presents with   Nail Problem    Routine foot care, nail trim    66 year old male presents the office today with above concerns.  He denies any drainage or pus coming from the toe sites.  No open lesions.  He is currently on cefadroxil for other issues.    ROS Past Medical History:  Diagnosis Date   Allergy    Arthritis    Diverticulitis    GERD (gastroesophageal reflux disease)    History of chicken pox    Hypertension    Peri-rectal abscess    Rectal pain     Past Surgical History:  Procedure Laterality Date   arthoscopic surgery on Left Knee  04/10/2014   BIOPSY  02/19/2022   Procedure: BIOPSY;  Surgeon: Lavena Bullion, DO;  Location: [facility];  Service: Gastroenterology;;   ESOPHAGOGASTRODUODENOSCOPY (EGD) WITH PROPOFOL N/A 02/19/2022   Procedure: ESOPHAGOGASTRODUODENOSCOPY (EGD) WITH PROPOFOL;  Surgeon: Lavena Bullion, DO;  Location: [facility];  Service: Gastroenterology;  Laterality: N/A;   fatty tumor removed Left    left side of face   FRACTURE SURGERY     left shoulder x 6 yrs ago and left x 42 yrs ago femur -knee cap   HEMORRHOID BANDING     OTHER SURGICAL HISTORY     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 Alvan Dame[facility]   SHOULDER SURGERY  01/12/2010   Left      Current Outpatient Medications:    cefadroxil (DURICEF) 500 MG capsule, Take 2 capsules (1,000 mg total) by mouth 2 (two) times daily., Disp: 120 capsule, Rfl: 0   lactulose (CHRONULAC) 10 GM/15ML solution, Take 45 mLs (30 g total) by mouth 2 (two) times daily as needed for mild constipation or moderate constipation (to make 2-3 loose BM a day)., Disp: 946 mL, Rfl: 2   lansoprazole (PREVACID) 30 MG capsule, Take 30 mg by  mouth daily as needed (Reflux)., Disp: , Rfl:    linezolid (ZYVOX) 600 MG tablet, Take 1 tablet (600 mg total) by mouth 2 (two) times daily for 28 days., Disp: 56 tablet, Rfl: 0   metoprolol succinate (TOPROL-XL) 50 MG 24 hr tablet, TAKE 1 TABLET BY MOUTH DAILY. TAKE WITH OR IMMEDIATELY FOLLOWING A MEAL. (Patient taking differently: Take 50 mg by mouth daily. TAKE WITH OR IMMEDIATELY FOLLOWING A MEAL.), Disp: 90 tablet, Rfl: 3   Multiple Vitamin (MULTIVITAMIN WITH MINERALS) TABS tablet, Take 1 tablet by mouth daily., Disp: 30 tablet, Rfl: 0   Probiotic Product (PROBIOTIC DAILY PO), Take 1 capsule by mouth daily., Disp: , Rfl:    rifaximin (XIFAXAN) 550 MG TABS tablet, Take 1 tablet (550 mg total) by mouth 2 (two) times daily., Disp: 60 tablet, Rfl: 0   sucralfate (CARAFATE) 1 g tablet, Take 1 tablet (1 g total) by mouth 4 (four) times daily., Disp: 120 tablet, Rfl: 11  No Known Allergies        Objective:  Physical Exam  General: AAO x3, NAD  Dermatological: Incurvation present to the hallux toenails.  There is some dried blood along the nail borders as well there is some slight erythema there is no drainage or pus.  No ascending cellulitis.  No fluctuation or crepitation.  No malodor.  No open lesions.  Vascular: Dorsalis Pedis artery and Posterior Tibial artery pedal pulses are palpable bilateral with immedate capillary fill time. There is no pain with calf compression, swelling, warmth, erythema.   Neruologic: Grossly intact via light touch bilateral.   Musculoskeletal: No other area discomfort.      Assessment:   Ingrown toenails     Plan:  I sharply debrided the symptomatic ingrown toenails with any complications or bleeding.  He is already on antibiotics which should cover any infection of the toenails.  Recommend antibiotic ointment changes daily.  If needed consider partial nail avulsions. Monitor for any clinical signs or symptoms of infection and directed to call the  office immediately should any occur or go to the ER.  Trula Slade DPM

## 2022-03-16 DIAGNOSIS — K7581 Nonalcoholic steatohepatitis (NASH): Secondary | ICD-10-CM | POA: Diagnosis not present

## 2022-03-16 DIAGNOSIS — K7460 Unspecified cirrhosis of liver: Secondary | ICD-10-CM | POA: Diagnosis not present

## 2022-03-16 DIAGNOSIS — I10 Essential (primary) hypertension: Secondary | ICD-10-CM | POA: Diagnosis not present

## 2022-03-16 DIAGNOSIS — D735 Infarction of spleen: Secondary | ICD-10-CM | POA: Diagnosis not present

## 2022-03-16 DIAGNOSIS — N179 Acute kidney failure, unspecified: Secondary | ICD-10-CM | POA: Diagnosis not present

## 2022-03-16 NOTE — Telephone Encounter (Signed)
Pt called and stated he will be out of xifaxan around 04/03/22. Pt wanted to know about patient assistance. Do you guys help with xifaxan assistance?

## 2022-03-17 DIAGNOSIS — D735 Infarction of spleen: Secondary | ICD-10-CM | POA: Diagnosis not present

## 2022-03-17 DIAGNOSIS — N179 Acute kidney failure, unspecified: Secondary | ICD-10-CM | POA: Diagnosis not present

## 2022-03-17 DIAGNOSIS — I10 Essential (primary) hypertension: Secondary | ICD-10-CM | POA: Diagnosis not present

## 2022-03-17 DIAGNOSIS — K7581 Nonalcoholic steatohepatitis (NASH): Secondary | ICD-10-CM | POA: Diagnosis not present

## 2022-03-17 DIAGNOSIS — K7460 Unspecified cirrhosis of liver: Secondary | ICD-10-CM | POA: Diagnosis not present

## 2022-03-18 NOTE — Telephone Encounter (Signed)
Mailed  [employer]  application for Xifaxan 576m  to patient home.   When pt bring or send their portion of the application,  please fax back pt portion and doctor portion to [phone]   [place] [employer] Patient Advocate

## 2022-03-21 ENCOUNTER — Other Ambulatory Visit (INDEPENDENT_AMBULATORY_CARE_PROVIDER_SITE_OTHER): Payer: Medicare Other

## 2022-03-21 ENCOUNTER — Encounter: Payer: Self-pay | Admitting: Family Medicine

## 2022-03-21 ENCOUNTER — Telehealth: Payer: Self-pay | Admitting: Family Medicine

## 2022-03-21 DIAGNOSIS — R410 Disorientation, unspecified: Secondary | ICD-10-CM | POA: Diagnosis not present

## 2022-03-21 DIAGNOSIS — K7460 Unspecified cirrhosis of liver: Secondary | ICD-10-CM | POA: Diagnosis not present

## 2022-03-21 DIAGNOSIS — M25562 Pain in left knee: Secondary | ICD-10-CM

## 2022-03-21 DIAGNOSIS — N179 Acute kidney failure, unspecified: Secondary | ICD-10-CM | POA: Diagnosis not present

## 2022-03-21 DIAGNOSIS — D735 Infarction of spleen: Secondary | ICD-10-CM | POA: Diagnosis not present

## 2022-03-21 DIAGNOSIS — I10 Essential (primary) hypertension: Secondary | ICD-10-CM | POA: Diagnosis not present

## 2022-03-21 DIAGNOSIS — K7581 Nonalcoholic steatohepatitis (NASH): Secondary | ICD-10-CM | POA: Diagnosis not present

## 2022-03-21 LAB — AMMONIA: Ammonia: 70 umol/L — ABNORMAL HIGH (ref 11–35)

## 2022-03-21 NOTE — Telephone Encounter (Signed)
I'm ok waiting on labs to see how things are- please make sure he is not drinking any alcohol though

## 2022-03-21 NOTE — Telephone Encounter (Signed)
Pt's spouse states pt has noticed some "goofiness" and is wondering if ammonia may be creeping up. Can labs be ordered for this? Please advise

## 2022-03-21 NOTE — Addendum Note (Signed)
Addended by: Doran Clay A on: 03/21/2022 02:32 PM   Modules accepted: Orders

## 2022-03-21 NOTE — Telephone Encounter (Signed)
See below, Marc Ponce came and asked if this can be added on along with the lab that pt was already here for, I told her she could add since pt was already here. Do you want to schedule a f/u visit regarding the "goofiness"?

## 2022-03-22 LAB — C-REACTIVE PROTEIN: CRP: 1.8 mg/dL (ref 0.5–20.0)

## 2022-03-23 NOTE — Telephone Encounter (Signed)
Called pt and let him know that xifaxan assistance paperwork was mailed. Pt stated he is out of town on a trip and requested that paperwork be sent to him by email. Sent mychart message with web address to paperwork. Pt verbalized understanding.   Doctor's portion of application faxed to [phone]

## 2022-03-29 ENCOUNTER — Ambulatory Visit (HOSPITAL_COMMUNITY): Payer: Medicare Other

## 2022-03-30 DIAGNOSIS — M4620 Osteomyelitis of vertebra, site unspecified: Secondary | ICD-10-CM | POA: Diagnosis not present

## 2022-04-01 DEATH — deceased

## 2022-04-05 ENCOUNTER — Ambulatory Visit: Payer: Medicare Other | Admitting: Gastroenterology

## 2022-05-04 ENCOUNTER — Ambulatory Visit: Payer: Medicare Other | Admitting: Family Medicine

## 2022-05-17 ENCOUNTER — Ambulatory Visit: Payer: Medicare Other | Admitting: Podiatry
